# Patient Record
Sex: Female | Born: 1980 | Race: White | Hispanic: No | Marital: Married | Smoking: Current every day smoker
Health system: Southern US, Community
[De-identification: ages and names within clinical notes are randomized; demographics above are authoritative.]

## PROBLEM LIST (undated history)

## (undated) DIAGNOSIS — R531 Weakness: Secondary | ICD-10-CM

## (undated) DIAGNOSIS — M79604 Pain in right leg: Secondary | ICD-10-CM

## (undated) DIAGNOSIS — K219 Gastro-esophageal reflux disease without esophagitis: Secondary | ICD-10-CM

## (undated) DIAGNOSIS — R519 Headache, unspecified: Secondary | ICD-10-CM

## (undated) DIAGNOSIS — J45909 Unspecified asthma, uncomplicated: Secondary | ICD-10-CM

## (undated) DIAGNOSIS — D72829 Elevated white blood cell count, unspecified: Secondary | ICD-10-CM

## (undated) DIAGNOSIS — R198 Other specified symptoms and signs involving the digestive system and abdomen: Secondary | ICD-10-CM

## (undated) DIAGNOSIS — E282 Polycystic ovarian syndrome: Secondary | ICD-10-CM

## (undated) DIAGNOSIS — R51 Headache: Secondary | ICD-10-CM

## (undated) DIAGNOSIS — F458 Other somatoform disorders: Secondary | ICD-10-CM

## (undated) DIAGNOSIS — F3181 Bipolar II disorder: Secondary | ICD-10-CM

## (undated) DIAGNOSIS — M797 Fibromyalgia: Secondary | ICD-10-CM

## (undated) DIAGNOSIS — I10 Essential (primary) hypertension: Secondary | ICD-10-CM

## (undated) DIAGNOSIS — G2581 Restless legs syndrome: Secondary | ICD-10-CM

## (undated) DIAGNOSIS — E611 Iron deficiency: Secondary | ICD-10-CM

## (undated) DIAGNOSIS — H7292 Unspecified perforation of tympanic membrane, left ear: Secondary | ICD-10-CM

## (undated) DIAGNOSIS — K089 Disorder of teeth and supporting structures, unspecified: Secondary | ICD-10-CM

## (undated) DIAGNOSIS — F419 Anxiety disorder, unspecified: Secondary | ICD-10-CM

## (undated) HISTORY — DX: Essential (primary) hypertension: I10

## (undated) HISTORY — DX: Elevated white blood cell count, unspecified: D72.829

## (undated) HISTORY — DX: Unspecified perforation of tympanic membrane, left ear: H72.92

## (undated) HISTORY — PX: CHOLECYSTECTOMY: SHX55

## (undated) HISTORY — DX: Unspecified asthma, uncomplicated: J45.909

## (undated) HISTORY — PX: WISDOM TOOTH EXTRACTION: SHX21

## (undated) HISTORY — DX: Fibromyalgia: M79.7

---

## 2008-08-02 ENCOUNTER — Encounter: Admission: RE | Admit: 2008-08-02 | Discharge: 2008-08-02 | Payer: Self-pay | Admitting: Family Medicine

## 2010-09-11 DIAGNOSIS — Z9049 Acquired absence of other specified parts of digestive tract: Secondary | ICD-10-CM | POA: Insufficient documentation

## 2013-12-27 ENCOUNTER — Ambulatory Visit (INDEPENDENT_AMBULATORY_CARE_PROVIDER_SITE_OTHER): Payer: BC Managed Care – PPO | Admitting: Family Medicine

## 2013-12-27 ENCOUNTER — Encounter: Payer: Self-pay | Admitting: Family Medicine

## 2013-12-27 VITALS — BP 127/83 | HR 62 | Ht 69.0 in | Wt 261.0 lb

## 2013-12-27 DIAGNOSIS — Z87442 Personal history of urinary calculi: Secondary | ICD-10-CM

## 2013-12-27 DIAGNOSIS — I10 Essential (primary) hypertension: Secondary | ICD-10-CM

## 2013-12-27 DIAGNOSIS — M62838 Other muscle spasm: Secondary | ICD-10-CM

## 2013-12-27 DIAGNOSIS — J45909 Unspecified asthma, uncomplicated: Secondary | ICD-10-CM

## 2013-12-27 DIAGNOSIS — H7292 Unspecified perforation of tympanic membrane, left ear: Secondary | ICD-10-CM | POA: Insufficient documentation

## 2013-12-27 DIAGNOSIS — M797 Fibromyalgia: Secondary | ICD-10-CM

## 2013-12-27 DIAGNOSIS — D72829 Elevated white blood cell count, unspecified: Secondary | ICD-10-CM | POA: Insufficient documentation

## 2013-12-27 DIAGNOSIS — Z13220 Encounter for screening for lipoid disorders: Secondary | ICD-10-CM

## 2013-12-27 DIAGNOSIS — K219 Gastro-esophageal reflux disease without esophagitis: Secondary | ICD-10-CM | POA: Insufficient documentation

## 2013-12-27 DIAGNOSIS — H7290 Unspecified perforation of tympanic membrane, unspecified ear: Secondary | ICD-10-CM

## 2013-12-27 DIAGNOSIS — R51 Headache: Secondary | ICD-10-CM

## 2013-12-27 DIAGNOSIS — IMO0001 Reserved for inherently not codable concepts without codable children: Secondary | ICD-10-CM

## 2013-12-27 HISTORY — DX: Unspecified asthma, uncomplicated: J45.909

## 2013-12-27 HISTORY — DX: Essential (primary) hypertension: I10

## 2013-12-27 HISTORY — DX: Elevated white blood cell count, unspecified: D72.829

## 2013-12-27 HISTORY — DX: Fibromyalgia: M79.7

## 2013-12-27 HISTORY — DX: Unspecified perforation of tympanic membrane, left ear: H72.92

## 2013-12-27 MED ORDER — ATENOLOL-CHLORTHALIDONE 50-25 MG PO TABS: 1.0000 | ORAL_TABLET | Freq: Every day | ORAL | Status: DC

## 2013-12-27 MED ORDER — TIZANIDINE HCL 4 MG PO TABS: 2.0000 mg | ORAL_TABLET | Freq: Four times a day (QID) | ORAL | Status: DC | PRN

## 2013-12-27 MED ORDER — DEXLANSOPRAZOLE 60 MG PO CPDR: 60.0000 mg | DELAYED_RELEASE_CAPSULE | Freq: Every day | ORAL | Status: DC

## 2013-12-27 MED ORDER — GABAPENTIN 300 MG PO CAPS: 300.0000 mg | ORAL_CAPSULE | Freq: Three times a day (TID) | ORAL | Status: DC
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
## 2014-01-02 LAB — BASIC METABOLIC PANEL WITH GFR
BUN: 11 mg/dL (ref 6–23)
CALCIUM: 9.1 mg/dL (ref 8.4–10.5)
CHLORIDE: 99 meq/L (ref 96–112)
CO2: 28 meq/L (ref 19–32)
Creat: 0.76 mg/dL (ref 0.50–1.10)
GFR, Est Non African American: >89 mL/min
GLUCOSE: 93 mg/dL (ref 70–99)
POTASSIUM: 3.5 meq/L (ref 3.5–5.3)
Sodium: 136 mEq/L (ref 135–145)

## 2014-01-02 LAB — LIPID PANEL
Cholesterol: 206 mg/dL — ABNORMAL HIGH (ref 0–200)
HDL: 42 mg/dL (ref >39)
LDL CALC: 116 mg/dL — AB (ref 0–99)
Total CHOL/HDL Ratio: 4.9 Ratio
Triglycerides: 242 mg/dL — ABNORMAL HIGH (ref <150)
VLDL: 48 mg/dL — AB (ref 0–40)

## 2014-01-02 LAB — CBC WITH DIFFERENTIAL/PLATELET
BASOS PCT: 0 % (ref 0–1)
Basophils Absolute: 0 10*3/uL (ref 0.0–0.1)
EOS ABS: 0.3 10*3/uL (ref 0.0–0.7)
Eosinophils Relative: 2 % (ref 0–5)
HEMATOCRIT: 42.3 % (ref 36.0–46.0)
HEMOGLOBIN: 14.5 g/dL (ref 12.0–15.0)
Lymphocytes Relative: 38 % (ref 12–46)
Lymphs Abs: 5.1 10*3/uL — ABNORMAL HIGH (ref 0.7–4.0)
MCH: 30.1 pg (ref 26.0–34.0)
MCHC: 34.3 g/dL (ref 30.0–36.0)
MCV: 87.9 fL (ref 78.0–100.0)
MONO ABS: 0.8 10*3/uL (ref 0.1–1.0)
MONOS PCT: 6 % (ref 3–12)
Neutro Abs: 7.2 10*3/uL (ref 1.7–7.7)
Neutrophils Relative %: 54 % (ref 43–77)
Platelets: 272 10*3/uL (ref 150–400)
RBC: 4.81 MIL/uL (ref 3.87–5.11)
RDW: 13.8 % (ref 11.5–15.5)
WBC: 13.4 10*3/uL — ABNORMAL HIGH (ref 4.0–10.5)

## 2014-01-03 ENCOUNTER — Encounter: Payer: Self-pay | Admitting: Family Medicine

## 2014-01-03 DIAGNOSIS — E785 Hyperlipidemia, unspecified: Secondary | ICD-10-CM | POA: Insufficient documentation

## 2014-01-19 ENCOUNTER — Other Ambulatory Visit: Payer: Self-pay

## 2014-01-19 DIAGNOSIS — K219 Gastro-esophageal reflux disease without esophagitis: Secondary | ICD-10-CM

## 2014-01-19 MED ORDER — DEXLANSOPRAZOLE 60 MG PO CPDR
60.0000 mg | DELAYED_RELEASE_CAPSULE | Freq: Every day | ORAL | Status: DC
Start: 2014-01-19 — End: 2014-01-23

## 2014-01-23 ENCOUNTER — Other Ambulatory Visit: Payer: Self-pay

## 2014-01-23 DIAGNOSIS — K219 Gastro-esophageal reflux disease without esophagitis: Secondary | ICD-10-CM

## 2014-01-23 MED ORDER — DEXLANSOPRAZOLE 60 MG PO CPDR: 60.0000 mg | DELAYED_RELEASE_CAPSULE | Freq: Every day | ORAL | Status: DC

## 2014-01-29 ENCOUNTER — Ambulatory Visit: Payer: BC Managed Care – PPO | Admitting: Family Medicine

## 2014-04-11 ENCOUNTER — Encounter: Payer: Self-pay | Admitting: Family Medicine

## 2014-04-11 ENCOUNTER — Ambulatory Visit (INDEPENDENT_AMBULATORY_CARE_PROVIDER_SITE_OTHER): Payer: BC Managed Care – PPO | Admitting: Family Medicine

## 2014-04-11 VITALS — BP 128/79 | HR 60 | Wt 273.0 lb

## 2014-04-11 DIAGNOSIS — K2100 Gastro-esophageal reflux disease with esophagitis, without bleeding: Secondary | ICD-10-CM

## 2014-04-11 DIAGNOSIS — R6884 Jaw pain: Secondary | ICD-10-CM

## 2014-04-11 MED ORDER — DIAZEPAM 5 MG PO TABS: 5.0000 mg | ORAL_TABLET | Freq: Two times a day (BID) | ORAL | Status: DC | PRN

## 2014-04-11 MED ORDER — TRAMADOL HCL 50 MG PO TABS: 50.0000 mg | ORAL_TABLET | Freq: Three times a day (TID) | ORAL | Status: DC | PRN

## 2014-04-11 MED ORDER — PANTOPRAZOLE SODIUM 40 MG PO TBEC: 40.0000 mg | DELAYED_RELEASE_TABLET | Freq: Two times a day (BID) | ORAL | Status: DC

## 2014-04-11 NOTE — Progress Notes (Signed)
CC: Margaret Lawson is a 33 y.o. female is here for right jaw pain   Subjective: HPI:  Right jaw pain localized just above the right ankle of the mandible and below the manubrium has been present for the past week on a daily basis. Slightly improved for the first 1-2 hours in the morning when she wears a bite guard. Symptoms are also slightly improved with ibuprofen however this causes worsening abdominal pain. Slight improvement tramadol. Pain is present both at rest and when chewing, describes pain is often having a spasm component. Moderate to severe in severity. She denies radiation of pain. Denies dental pain, fevers, chills, dysphagia, trouble swallowing, facial pain, nor recent or remote trauma  Complains of mild improvement of her epigastric discomfort after starting on DEXILANT however even with savings voucher she will still cost her about $100 a month. She's back to taking ranitidine 300 mg 3 times a day with only mild improvement of epigastric discomfort radiates up the back of her throat causing an acid sensation when lying down at night. She denies unintentional weight loss or abdominal pain elsewhere    Review Of Systems Outlined In HPI  Past Medical History  Diagnosis Date  . Asthma, chronic 12/27/2013  . Essential hypertension, benign 12/27/2013  . Fibromyalgia 12/27/2013  . Leukocytosis 12/27/2013  . Perforation of left tympanic membrane 12/27/2013    No past surgical history on file. No family history on file.  History   Social History  . Marital Status: Married    Spouse Name: N/A    Number of Children: N/A  . Years of Education: N/A   Occupational History  . Not on file.   Social History Main Topics  . Smoking status: Current Every Day Smoker  . Smokeless tobacco: Not on file  . Alcohol Use: Not on file  . Drug Use: No  . Sexual Activity: Not on file   Other Topics Concern  . Not on file   Social History Narrative  . No narrative on file      Objective: BP 128/79  Pulse 60  Wt 273 lb (123.832 kg)  General: Alert and Oriented, No Acute Distress HEENT: Pupils equal, round, reactive to light. Conjunctivae clear.  External ears unremarkable, canals clear with intact TMs with appropriate landmarks.  Middle ear appears open without effusion. Pink inferior turbinates.  Moist mucous membranes, pharynx without inflammation nor lesions.  Neck supple without palpable lymphadenopathy nor abnormal masses. No dental pain on the right side of the mouth with palpation, no parotid gland or duct tenderness to palpation, pain is 100% reproduced with palpation of the right masseter Abdomen: Obese and soft Extremities: No peripheral edema.  Strong peripheral pulses.  Mental Status: No depression, anxiety, nor agitation. Skin: Warm and dry.  Assessment & Plan: Jayah was seen today for right jaw pain.  Diagnoses and associated orders for this visit:  Jaw pain - diazepam (VALIUM) 5 MG tablet; Take 1-2 tablets (5-10 mg total) by mouth every 12 (twelve) hours as needed (jaw pain). - traMADol (ULTRAM) 50 MG tablet; Take 1-2 tablets (50-100 mg total) by mouth every 8 (eight) hours as needed.  Gastroesophageal reflux disease with esophagitis - pantoprazole (PROTONIX) 40 MG tablet; Take 1 tablet (40 mg total) by mouth 2 (two) times daily.    Jaw pain: Feel that this is due to hypertonicity and straining of the right masseter muscle, begin Ultram as needed for pain control and diazepam to help with relaxing the muscle, sedative warning  provided. She's also going to look into finding a bite guard to wear during the daytime. GERD: Uncontrolled switching to Protonix twice a day to see if this is more affordable than DEXILANT  Return if symptoms worsen or fail to improve.

## 2014-04-17 ENCOUNTER — Telehealth: Payer: Self-pay

## 2014-04-17 DIAGNOSIS — R6884 Jaw pain: Secondary | ICD-10-CM

## 2014-04-17 MED ORDER — DIAZEPAM 5 MG PO TABS: 5.0000 mg | ORAL_TABLET | Freq: Two times a day (BID) | ORAL | Status: DC | PRN

## 2014-04-17 NOTE — Telephone Encounter (Signed)
I'd be willing to provide a new Rx to cover one month's worth however by my math this would only come out to 60 tablets.  If no better after a month then physical therapy would be the next step in treatment.  (Rx in Andrea's inbox)

## 2014-04-17 NOTE — Telephone Encounter (Signed)
Margaret Lawson states she wants to stop the Zanaflex and only take the diazepam. She is taking the diazepam 5 mg tablets bid daily. She would need her prescription to read 120 tablets for 30 days. She reports this dosing is helping with her jaw pain. Please advise.

## 2014-04-18 NOTE — Telephone Encounter (Signed)
Left message on pt.'s vm.

## 2014-05-03 ENCOUNTER — Other Ambulatory Visit: Payer: Self-pay | Admitting: Family Medicine

## 2014-06-04 ENCOUNTER — Other Ambulatory Visit: Payer: Self-pay | Admitting: Family Medicine

## 2014-06-07 ENCOUNTER — Encounter: Payer: Self-pay | Admitting: Emergency Medicine

## 2014-06-07 ENCOUNTER — Emergency Department (INDEPENDENT_AMBULATORY_CARE_PROVIDER_SITE_OTHER)
Admission: EM | Admit: 2014-06-07 | Discharge: 2014-06-07 | Disposition: A | Discharge status: Home / Self Care | Payer: BC Managed Care – PPO | Source: Home / Self Care | Attending: Emergency Medicine | Admitting: Emergency Medicine

## 2014-06-07 DIAGNOSIS — J4521 Mild intermittent asthma with (acute) exacerbation: Secondary | ICD-10-CM

## 2014-06-07 DIAGNOSIS — J209 Acute bronchitis, unspecified: Secondary | ICD-10-CM

## 2014-06-07 HISTORY — DX: Bipolar II disorder: F31.81

## 2014-06-07 MED ORDER — ALBUTEROL SULFATE HFA 108 (90 BASE) MCG/ACT IN AERS: 2.0000 | INHALATION_SPRAY | RESPIRATORY_TRACT | Status: DC | PRN

## 2014-06-07 MED ORDER — ALBUTEROL SULFATE (2.5 MG/3ML) 0.083% IN NEBU: 2.5000 mg | INHALATION_SOLUTION | RESPIRATORY_TRACT | Status: DC | PRN

## 2014-06-07 MED ORDER — IPRATROPIUM-ALBUTEROL 0.5-2.5 (3) MG/3ML IN SOLN
3.0000 mL | RESPIRATORY_TRACT | Status: AC
  Administered 2014-06-07: 3 mL via RESPIRATORY_TRACT

## 2014-06-07 MED ORDER — TRAMADOL HCL 50 MG PO TABS
ORAL_TABLET | ORAL | Status: DC
Start: 2014-06-07 — End: 2014-06-13

## 2014-06-07 MED ORDER — METHYLPREDNISOLONE SODIUM SUCC 125 MG IJ SOLR
125.0000 mg | Freq: Once | INTRAMUSCULAR | Status: AC
  Administered 2014-06-07: 125 mg via INTRAMUSCULAR

## 2014-06-07 MED ORDER — PREDNISONE 20 MG PO TABS: 20.0000 mg | ORAL_TABLET | Freq: Two times a day (BID) | ORAL | Status: DC

## 2014-06-07 MED ORDER — AZITHROMYCIN 250 MG PO TABS: ORAL_TABLET | ORAL | Status: DC

## 2014-06-07 NOTE — ED Provider Notes (Signed)
CSN: [id number]     Arrival date & time 06/07/14  1133 History   First MD Initiated Contact with Patient 06/07/14 1307     Chief Complaint  Patient presents with  . Cough   (Consider location/radiation/quality/duration/timing/severity/associated sxs/prior Treatment) HPI URI HISTORY  Margaret Lawson is a 33 y.o. female who complains of onset ofchest congestion, productive cough, dull, nonspecific, moderate intensity mid back pain for 3 days.OTC meds not helping No chills/sweats +  Fever  +  Nasal congestion +  Discolored Post-nasal drainage No sinus pain/pressure No sore throat  +  cough positive wheezing positivechest congestion No hemoptysis No shortness of breath No pleuritic pain  No itchy/red eyes No earache  No nausea No vomiting No abdominal pain No diarrhea  No skin rashes +  Fatigue No myalgias No headache    Past Medical History  Diagnosis Date  . Asthma, chronic 12/27/2013  . Essential hypertension, benign 12/27/2013  . Fibromyalgia 12/27/2013  . Leukocytosis 12/27/2013  . Perforation of left tympanic membrane 12/27/2013  . Bipolar 2 disorder    History reviewed. No pertinent past surgical history. No family history on file. History  Substance Use Topics  . Smoking status: Current Every Day Smoker  . Smokeless tobacco: Not on file  . Alcohol Use: Not on file   OB History    No data available     Review of Systems  All other systems reviewed and are negative.   Allergies  Lyrica and Penicillins  Home Medications   Prior to Admission medications   Medication Sig Start Date End Date Taking? Authorizing Provider  albuterol (PROVENTIL HFA;VENTOLIN HFA) 108 (90 BASE) MCG/ACT inhaler Inhale 2 puffs into the lungs every 4 (four) hours as needed for wheezing. 06/07/14   Jacqulyn Cane, MD  albuterol (PROVENTIL) (2.5 MG/3ML) 0.083% nebulizer solution Take 3 mLs (2.5 mg total) by nebulization every 4 (four) hours as needed for wheezing. 06/07/14   Jacqulyn Cane,  MD  atenolol-chlorthalidone (TENORETIC) 50-25 MG per tablet Take 1 tablet by mouth daily. 12/27/13   Marcial Pacas, DO  azithromycin (ZITHROMAX Z-PAK) 250 MG tablet Take 2 tablets on day one, then 1 tablet daily on days 2 through 5 06/07/14   Jacqulyn Cane, MD  buPROPion [facility] SR) 150 MG 12 hr tablet Take 150 mg by mouth 2 (two) times daily.    Historical Provider, MD  diazepam (VALIUM) 5 MG tablet Take 1 tablet (5 mg total) by mouth every 12 (twelve) hours as needed (jaw pain). Call Dr. for PT referral if symptoms persist to 05/15/14 04/17/14   Sean Hommel, DO  DULoxetine (CYMBALTA) 30 MG capsule Take 1 capsule (30 mg total) by mouth daily. 12/27/13   Sean Hommel, DO  gabapentin (NEURONTIN) 300 MG capsule TAKE 1 CAPSULE BY MOUTH THREE TIMES DAILY 06/05/14   Marcial Pacas, DO  lamoTRIgine (LAMICTAL) 100 MG tablet Take 100 mg by mouth daily.    Historical Provider, MD  OLANZapine (ZYPREXA) 5 MG tablet Take 5 mg by mouth at bedtime.    Historical Provider, MD  pantoprazole (PROTONIX) 40 MG tablet Take 1 tablet (40 mg total) by mouth 2 (two) times daily. 04/11/14   Marcial Pacas, DO  predniSONE (DELTASONE) 20 MG tablet Take 1 tablet (20 mg total) by mouth 2 (two) times daily with a meal. For 7 days 06/07/14   Jacqulyn Cane, MD  tiZANidine (ZANAFLEX) 4 MG tablet Take 0.5-1 tablets (2-4 mg total) by mouth every 6 (six) hours as needed for muscle spasms. 12/27/13  Sean Hommel, DO  traMADol (ULTRAM) 50 MG tablet Take 1-2 tablets (50-100 mg total) by mouth every 8 (eight) hours as needed. 04/11/14   Marcial Pacas, DO  traMADol (ULTRAM) 50 MG tablet One or 2 tablets every 8 hours as needed for severe pain 06/07/14   Jacqulyn Cane, MD  traZODone (DESYREL) 50 MG tablet Take 1-2 tablets (50-100 mg total) by mouth daily. 12/27/13   Sean Hommel, DO   BP 128/83 mmHg  Pulse 66  Temp(Src) 98 F (36.7 C) (Oral)  Ht 5\' 7"  (1.702 m)  Wt 273 lb (123.832 kg)  BMI 42.75 kg/m2  SpO2 96%  LMP 06/06/2014 Physical Exam   Constitutional: She is oriented to person, place, and time. She appears well-developed and well-nourished. No distress.  Strong odor of tobacco. She appears uncomfortable but no acute respiratory distress  HENT:  Head: Normocephalic and atraumatic.  Right Ear: Tympanic membrane, external ear and ear canal normal.  Left Ear: Tympanic membrane, external ear and ear canal normal.  Nose: Mucosal edema and rhinorrhea present. Right sinus exhibits maxillary sinus tenderness. Left sinus exhibits maxillary sinus tenderness.  Mouth/Throat: Oropharynx is clear and moist. No oral lesions. No oropharyngeal exudate.  Eyes: Right eye exhibits no discharge. Left eye exhibits no discharge. No scleral icterus.  Neck: Neck supple.  Cardiovascular: Normal rate, regular rhythm and normal heart sounds.   Pulmonary/Chest: Effort normal. She has wheezes. She has rhonchi. She has no rales.  Lymphadenopathy:    She has no cervical adenopathy.  Neurological: She is alert and oriented to person, place, and time.  Skin: Skin is warm and dry.  Psychiatric: She has a normal mood and affect.  Nursing note and vitals reviewed.   ED Course  Procedures (including critical care time) Labs Review Labs Reviewed - No data to display  Imaging Review No results found.   MDM   1. Bronchitis, acute, with bronchospasm   2. Asthma with acute exacerbation, mild intermittent   URI/sinusitis  Treatment options discussed, as well as risks, benefits, alternatives. Patient voiced understanding and agreement with the following plans: DuoNeb nebulizer treatment given. Wheezing improved. Solu-Medrol 125 mg IM Discharge Medication List as of 06/07/2014  2:28 PM    START taking these medications   Details  albuterol (PROVENTIL HFA;VENTOLIN HFA) 108 (90 BASE) MCG/ACT inhaler Inhale 2 puffs into the lungs every 4 (four) hours as needed for wheezing., Starting 06/07/2014, Until Discontinued, Print    albuterol (PROVENTIL)  (2.5 MG/3ML) 0.083% nebulizer solution Take 3 mLs (2.5 mg total) by nebulization every 4 (four) hours as needed for wheezing., Starting 06/07/2014, Until Discontinued, Print    azithromycin (ZITHROMAX Z-PAK) 250 MG tablet Take 2 tablets on day one, then 1 tablet daily on days 2 through 5, Print    predniSONE (DELTASONE) 20 MG tablet Take 1 tablet (20 mg total) by mouth 2 (two) times daily with a meal. For 7 days, Starting 06/07/2014, Until Discontinued, Print    !! traMADol (ULTRAM) 50 MG tablet One or 2 tablets every 8 hours as needed for severe pain, Print        Other symptomatic care discussed. Advised to quit smoking Follow-up with your primary care doctor in 5-7 days if not improving, or sooner if symptoms become worse. Precautions discussed. Red flags discussed. Questions invited and answered. Patient voiced understanding and agreement.     Jacqulyn Cane, MD 06/08/14 2126

## 2014-06-07 NOTE — ED Notes (Signed)
Productive cough, congestion, mid-back pain x 3 days

## 2014-06-13 ENCOUNTER — Ambulatory Visit (INDEPENDENT_AMBULATORY_CARE_PROVIDER_SITE_OTHER): Payer: BC Managed Care – PPO | Admitting: Family Medicine

## 2014-06-13 ENCOUNTER — Encounter: Payer: Self-pay | Admitting: Family Medicine

## 2014-06-13 VITALS — BP 142/87 | HR 64 | Temp 98.0°F | Wt 270.0 lb

## 2014-06-13 DIAGNOSIS — J4541 Moderate persistent asthma with (acute) exacerbation: Secondary | ICD-10-CM

## 2014-06-13 MED ORDER — IPRATROPIUM-ALBUTEROL 0.5-2.5 (3) MG/3ML IN SOLN
3.0000 mL | Freq: Once | RESPIRATORY_TRACT | Status: AC
  Administered 2014-06-13: 3 mL via RESPIRATORY_TRACT

## 2014-06-13 MED ORDER — DOXYCYCLINE HYCLATE 100 MG PO TABS: ORAL_TABLET | ORAL | Status: AC

## 2014-06-13 MED ORDER — IPRATROPIUM-ALBUTEROL 0.5-2.5 (3) MG/3ML IN SOLN: 3.0000 mL | RESPIRATORY_TRACT | Status: DC | PRN

## 2014-06-13 MED ORDER — PREDNISONE 20 MG PO TABS: ORAL_TABLET | ORAL | Status: DC

## 2014-06-13 MED ORDER — IPRATROPIUM-ALBUTEROL 0.5-2.5 (3) MG/3ML IN SOLN: 3.0000 mL | RESPIRATORY_TRACT | Status: DC

## 2014-06-13 NOTE — Progress Notes (Signed)
CC: Margaret Lawson is a 33 y.o. female is here for Asthma   Subjective: HPI:  Patient complains of productive cough with chest congestion and wheezing with shortness of breath all of which is moderate in severity and overall barely improved since being prescribed 40 mg of prednisone, albuterol nebulizer and azithromycin last week. Symptoms have been present for 10 days now. Wheezing and shortness of breath is most responsive to albuterol but only temporarily.  She reports this is also accompanied by fatigue. Symptoms are present all hours of the day but most noticeable at night. Denies fevers, chills, chest pain, confusion, motor or sensory disturbances. Review of systems is positive for postnasal drip and nasal congestion with occasional nosebleeds.  She elaborates on the above complaints tying into worsening her fibromyalgia and pain. Her pain is localized mostly in her back and is worse the more active she is during the day. Without my specific questioning she provides a great deal of elaborating on how her other chronic medical conditions could tie into her respiratory illness today   Review Of Systems Outlined In HPI  Past Medical History  Diagnosis Date  . Asthma, chronic 12/27/2013  . Essential hypertension, benign 12/27/2013  . Fibromyalgia 12/27/2013  . Leukocytosis 12/27/2013  . Perforation of left tympanic membrane 12/27/2013  . Bipolar 2 disorder     No past surgical history on file. No family history on file.  History   Social History  . Marital Status: Married    Spouse Name: N/A    Number of Children: N/A  . Years of Education: N/A   Occupational History  . Not on file.   Social History Main Topics  . Smoking status: Current Every Day Smoker  . Smokeless tobacco: Not on file  . Alcohol Use: Not on file  . Drug Use: No  . Sexual Activity: Not on file   Other Topics Concern  . Not on file   Social History Narrative     Objective: BP 142/87 mmHg  Pulse 64   Temp(Src) 98 F (36.7 C) (Oral)  Wt 270 lb (122.471 kg)  SpO2 96%  LMP 06/06/2014  General: Alert and Oriented, No Acute Distress HEENT: Pupils equal, round, reactive to light. Conjunctivae clear.  External ears unremarkable, canals clear with intact TMs with appropriate landmarks.  Middle ear appears open without effusion. Pink inferior turbinates.  Moist mucous membranes, pharynx without inflammation nor lesions however moderate cobblestoning and postnasal drip.  Neck supple without palpable lymphadenopathy nor abnormal masses. Lungs: comfortable work of breathing with trace and asked for wheezing in all lung fields. There is no rhonchi or rales. She is frequently coughing  Extremities: No peripheral edema.  Strong peripheral pulses.  Mental Status: No depression, anxiety, nor agitation. Skin: Warm and dry.  Assessment & Plan: Margaret Lawson was seen today for asthma.  Diagnoses and associated orders for this visit:  Asthma with acute exacerbation, moderate persistent - ipratropium-albuterol (DUONEB) 0.5-2.5 (3) MG/3ML SOLN; Take 3 mLs by nebulization every 4 (four) hours as needed (wheezing/cough/sob). - predniSONE (DELTASONE) 20 MG tablet; Three tabs daily days 1-3, two tabs daily days 4-6, one tab daily days 7-9, half tab daily days 10-13. - doxycycline (VIBRA-TABS) 100 MG tablet; One by mouth twice a day for ten days. - Discontinue: ipratropium-albuterol (DUONEB) 0.5-2.5 (3) MG/3ML nebulizer solution 3 mL; Take 3 mLs by nebulization every 4 (four) hours. - ipratropium-albuterol (DUONEB) 0.5-2.5 (3) MG/3ML nebulizer solution 3 mL; Take 3 mLs by nebulization once.  Redirecting from her chronic medical conditions was required 4 times to focus on her acute visit chief complaint of asthma exacerbation. I do believe she is right that she is having an asthma exacerbation and I Like to be more aggressive about her treatment now adding ipratropium to her albuterol regimen. Increasing prednisone  dose and begin taper. Stop azithromycin and switch to doxycycline. Prior to leaving she was provided with a DuoNeb treatment since it may be many hours and she can get this new prescription refill. One of her other repeated concerns today was chronic back pain management, I've asked her to let me know  Her back response to the prednisone above.  40 minutes spent face-to-face during visit today of which at least 50% was counseling or coordinating care regarding: 1. Asthma with acute exacerbation, moderate persistent       Return if symptoms worsen or fail to improve.

## 2014-06-18 ENCOUNTER — Telehealth: Payer: Self-pay | Admitting: *Deleted

## 2014-06-18 MED ORDER — HYDROCODONE-HOMATROPINE 5-1.5 MG/5ML PO SYRP: 5.0000 mL | ORAL_SOLUTION | Freq: Three times a day (TID) | ORAL | Status: DC | PRN

## 2014-06-18 NOTE — Telephone Encounter (Signed)
Seth Bake, Rx placed in in-box ready for pickup/faxing. (hycodan)

## 2014-06-18 NOTE — Telephone Encounter (Signed)
Margaret Lawson called and she is still experiencing chest discomfort and tightness even after being on the steroids for 1 week. She just wanted you to be aware and if she was aloud to have anything for pain now? Margette Fast, CMA

## 2014-06-19 NOTE — Telephone Encounter (Signed)
Understood, thanks.  The cough medicine has a pain medication ingredient which should still help pain.

## 2014-06-19 NOTE — Telephone Encounter (Signed)
The pt's is not having chest congestion,discomfort or tightness. It is her back that is still bothering her and giving her pain after having finished the prednisone

## 2014-06-20 NOTE — Telephone Encounter (Signed)
Pt.notified

## 2014-06-26 ENCOUNTER — Other Ambulatory Visit: Payer: Self-pay | Admitting: Family Medicine

## 2014-07-04 ENCOUNTER — Other Ambulatory Visit: Payer: Self-pay | Admitting: Family Medicine

## 2014-07-09 ENCOUNTER — Telehealth: Payer: Self-pay | Admitting: *Deleted

## 2014-07-09 NOTE — Telephone Encounter (Signed)
Pt called and states the cough medication rx'ed on 11/23 did help with her back pain( there was a misunderstanding in triage and initial note suggested pt had a cough but it was back pain from fibromyalgia) Pt wants to know what else she can do for her fibromyalgia pain

## 2014-07-10 NOTE — Telephone Encounter (Signed)
Follow up appt advised

## 2014-07-11 NOTE — Telephone Encounter (Signed)
Pt.notified

## 2014-07-13 ENCOUNTER — Other Ambulatory Visit: Payer: Self-pay | Admitting: Family Medicine

## 2014-07-17 ENCOUNTER — Other Ambulatory Visit: Payer: Self-pay | Admitting: Family Medicine

## 2014-07-27 ENCOUNTER — Other Ambulatory Visit: Payer: Self-pay | Admitting: Family Medicine

## 2014-08-03 ENCOUNTER — Encounter: Payer: Self-pay | Admitting: Family Medicine

## 2014-08-03 ENCOUNTER — Ambulatory Visit (INDEPENDENT_AMBULATORY_CARE_PROVIDER_SITE_OTHER): Payer: BLUE CROSS/BLUE SHIELD | Admitting: Family Medicine

## 2014-08-03 VITALS — BP 151/98 | HR 54 | Wt 284.0 lb

## 2014-08-03 DIAGNOSIS — M79642 Pain in left hand: Secondary | ICD-10-CM | POA: Diagnosis not present

## 2014-08-03 DIAGNOSIS — M797 Fibromyalgia: Secondary | ICD-10-CM

## 2014-08-03 DIAGNOSIS — M79641 Pain in right hand: Secondary | ICD-10-CM | POA: Diagnosis not present

## 2014-08-03 DIAGNOSIS — M5489 Other dorsalgia: Secondary | ICD-10-CM | POA: Diagnosis not present

## 2014-08-03 MED ORDER — GABAPENTIN 400 MG PO CAPS: 400.0000 mg | ORAL_CAPSULE | Freq: Three times a day (TID) | ORAL | Status: DC

## 2014-08-03 NOTE — Progress Notes (Signed)
CC: Margaret Lawson is a 34 y.o. female is here for f/u fibromyalgia   Subjective: HPI:  "my whole body feels like a bruise" which has been going on for the past month. Symptoms fluctuate from mild to severe in severity. She describes her pain as sensitivity to light touch to her skin or any of her muscles. Locations of pain seem to migrate throughout the day and week however predominantly based as migrating to the right leg, upper chest and upper back. She's also been having increased paresthesia in both of her hands mostly in an ulnar distribution and mostly when sleeping. She tells me that this pain above represents what she considers her chronic fibromyalgia symptoms however she is beginning to have bilateral hand pain in all the knuckles which is symmetric in accompanied by swelling. This is been going on for the past 2 weeks and is new to her. She denies any skin changes. There is been no fevers, chills, nor focal joint pain elsewhere other than that described above   Review Of Systems Outlined In HPI  Past Medical History  Diagnosis Date  . Asthma, chronic 12/27/2013  . Essential hypertension, benign 12/27/2013  . Fibromyalgia 12/27/2013  . Leukocytosis 12/27/2013  . Perforation of left tympanic membrane 12/27/2013  . Bipolar 2 disorder     No past surgical history on file. No family history on file.  History   Social History  . Marital Status: Married    Spouse Name: N/A    Number of Children: N/A  . Years of Education: N/A   Occupational History  . Not on file.   Social History Main Topics  . Smoking status: Current Every Day Smoker  . Smokeless tobacco: Not on file  . Alcohol Use: Not on file  . Drug Use: No  . Sexual Activity: Not on file   Other Topics Concern  . Not on file   Social History Narrative     Objective: BP 151/98 mmHg  Pulse 54  Wt 284 lb (128.822 kg)  Vital signs reviewed. General: Alert and Oriented, No Acute Distress HEENT: Pupils equal,  round, reactive to light. Conjunctivae clear.  External ears unremarkable.  Moist mucous membranes. Lungs: Clear and comfortable work of breathing, speaking in full sentences without accessory muscle use. Cardiac: Regular rate and rhythm.  Neuro: CN II-XII grossly intact, gait normal. Extremities: No lower extremityperipheral edema.  Strong peripheral pulses. Questionable swelling of bilateral MCPs no swelling of DIP or PIPs Mental Status: No depression, anxiety, nor agitation. Logical though process. Skin: Warm and dry.  Assessment & Plan: Margaret Lawson was seen today for f/u fibromyalgia.  Diagnoses and associated orders for this visit:  Fibromyalgia - gabapentin (NEURONTIN) 400 MG capsule; Take 1 capsule (400 mg total) by mouth 3 (three) times daily.  Midline back pain, unspecified location - Rheumatoid Factor - Cyclic citrul peptide antibody, IgG - Antinuclear Antib (ANA)  Bilateral hand pain - Rheumatoid Factor - Cyclic citrul peptide antibody, IgG - Antinuclear Antib (ANA)    Fibromyalgia: Uncontrolled chronic condition increasing gabapentin She tells me she's never had a rheumatologic workup and all of her pain is always been passed off as fibromyalgia therefore given her new hand pain today we will screen for connective tissue disorders and rheumatologic disease.  Return in about 4 weeks (around 08/31/2014) for Pain.

## 2014-08-04 LAB — RHEUMATOID FACTOR

## 2014-08-06 ENCOUNTER — Other Ambulatory Visit: Payer: Self-pay | Admitting: Family Medicine

## 2014-08-06 LAB — ANA: Anti Nuclear Antibody(ANA): NEGATIVE

## 2014-08-07 LAB — CYCLIC CITRUL PEPTIDE ANTIBODY, IGG: Cyclic Citrullin Peptide Ab: <2 U/mL (ref 0.0–5.0)

## 2014-08-23 ENCOUNTER — Other Ambulatory Visit: Payer: Self-pay | Admitting: Family Medicine

## 2014-08-23 ENCOUNTER — Telehealth: Payer: Self-pay | Admitting: Family Medicine

## 2014-08-23 DIAGNOSIS — K2100 Gastro-esophageal reflux disease with esophagitis, without bleeding: Secondary | ICD-10-CM

## 2014-08-23 MED ORDER — PANTOPRAZOLE SODIUM 40 MG PO TBEC: 40.0000 mg | DELAYED_RELEASE_TABLET | Freq: Two times a day (BID) | ORAL | Status: DC

## 2014-08-23 NOTE — Telephone Encounter (Signed)
Refill req 

## 2014-08-29 ENCOUNTER — Other Ambulatory Visit: Payer: Self-pay | Admitting: Family Medicine

## 2014-08-30 NOTE — Telephone Encounter (Signed)
Need appt specifically for BP

## 2014-10-03 ENCOUNTER — Other Ambulatory Visit: Payer: Self-pay | Admitting: Family Medicine

## 2014-10-11 ENCOUNTER — Encounter: Payer: Self-pay | Admitting: Family Medicine

## 2014-10-11 ENCOUNTER — Ambulatory Visit (INDEPENDENT_AMBULATORY_CARE_PROVIDER_SITE_OTHER): Payer: BLUE CROSS/BLUE SHIELD | Admitting: Family Medicine

## 2014-10-11 VITALS — BP 158/111 | HR 109 | Wt 280.0 lb

## 2014-10-11 DIAGNOSIS — F3130 Bipolar disorder, current episode depressed, mild or moderate severity, unspecified: Secondary | ICD-10-CM | POA: Diagnosis not present

## 2014-10-11 DIAGNOSIS — K2100 Gastro-esophageal reflux disease with esophagitis, without bleeding: Secondary | ICD-10-CM

## 2014-10-11 DIAGNOSIS — M797 Fibromyalgia: Secondary | ICD-10-CM

## 2014-10-11 DIAGNOSIS — I10 Essential (primary) hypertension: Secondary | ICD-10-CM | POA: Diagnosis not present

## 2014-10-11 MED ORDER — GABAPENTIN 600 MG PO TABS: 600.0000 mg | ORAL_TABLET | Freq: Three times a day (TID) | ORAL | Status: DC

## 2014-10-11 MED ORDER — ATENOLOL-CHLORTHALIDONE 100-25 MG PO TABS: 1.0000 | ORAL_TABLET | Freq: Every day | ORAL | Status: DC

## 2014-10-11 MED ORDER — PANTOPRAZOLE SODIUM 40 MG PO TBEC: 40.0000 mg | DELAYED_RELEASE_TABLET | Freq: Every day | ORAL | Status: DC

## 2014-10-11 NOTE — Progress Notes (Signed)
CC: Margaret Lawson is a 34 y.o. female is here for Fibromyalgia   Subjective: HPI:  Follow-up essential hypertension: Has run out of atenolol-chlorthalidone at the dose of 50-25 milligrams daily. She's been off of this for 1 week now. Prior to running out of the medication systolic numbers were still in stage I hypertension and diastolics were in stage I as well. No chest pain shortness of breath orthopnea nor peripheral edema. No formal exercise routine  Follow fibromyalgia: Since she saw me last she went to see a rheumatologist to get a second opinion on whether or not her pain was from fibromyalgia or some other rheumatologic disease. Her rheumatologist agreed that her pain is from fibromyalgia is uncontrolled and needs to be addressed by quitting smoking and losing weight first. The patient was not happy with this answer and did not schedule follow-up with that provider. She continues to have "electric" pain on the surface of her skin that roams throughout the body and does not seem to predict or reproducibly follow any pattern. This present both with rest and with activity. It seems to be worse after periods of inactivity. The pain is bad enough to where it limits her from going out and doing simple things such as grocery shopping. She denies any joint pain and is localizing pain to the skin and muscle bellies. She reports weakness but no focal location of the weakness. Symptoms seem to have been worsening since I saw her last.   Follow-up GERD: Her insurance company is no longer covering Protonix twice a day. She's been off of this medication for a few weeks now and has been attempting to control epigastric discomfort and reflux with Prevacid which has been ineffective. She is not waking from her pain at night and there's been no unintentional weight loss.  Follow bipolar disorder: She stopped seeing her former psychiatrist and stopped all of her psychiatric medication because her former  provider was no longer fully covered by her insurance.   Review Of Systems Outlined In HPI  Past Medical History  Diagnosis Date  . Asthma, chronic 12/27/2013  . Essential hypertension, benign 12/27/2013  . Fibromyalgia 12/27/2013  . Leukocytosis 12/27/2013  . Perforation of left tympanic membrane 12/27/2013  . Bipolar 2 disorder     No past surgical history on file. No family history on file.  History   Social History  . Marital Status: Married    Spouse Name: N/A  . Number of Children: N/A  . Years of Education: N/A   Occupational History  . Not on file.   Social History Main Topics  . Smoking status: Current Every Day Smoker  . Smokeless tobacco: Not on file  . Alcohol Use: Not on file  . Drug Use: No  . Sexual Activity: Not on file   Other Topics Concern  . Not on file   Social History Narrative     Objective: BP 158/111 mmHg  Pulse 109  Wt 280 lb (127.007 kg)  Vital signs reviewed. General: Alert and Oriented, No Acute Distress HEENT: Pupils equal, round, reactive to light. Conjunctivae clear.  External ears unremarkable.  Moist mucous membranes. Lungs: Clear and comfortable work of breathing, speaking in full sentences without accessory muscle use. Cardiac: Regular rate and rhythm.  Neuro: CN II-XII grossly intact, gait normal. Extremities: No peripheral edema.  Strong peripheral pulses.  Mental Status: Moderate depression. No anxiety, nor agitation. Logical though process. Skin: Warm and dry.  Assessment & Plan: Margaret Lawson was seen today  for fibromyalgia.  Diagnoses and all orders for this visit:  Essential hypertension, benign Orders: -     atenolol-chlorthalidone (TENORETIC 100) 100-25 MG per tablet; Take 1 tablet by mouth daily.  Fibromyalgia Orders: -     gabapentin (NEURONTIN) 600 MG tablet; Take 1 tablet (600 mg total) by mouth 3 (three) times daily.  Gastroesophageal reflux disease with esophagitis Orders: -     pantoprazole (PROTONIX) 40 MG  tablet; Take 1 tablet (40 mg total) by mouth daily.  Bipolar I disorder, most recent episode depressed Orders: -     Ambulatory referral to Psychiatry   essential hypertension: Uncontrolled increasing Tenoretic Fibromyalgia: Uncontrolled increasing gabapentin. She's asked me if I can comment on the degree of her fibromyalgia and how it makes her fully disabled since she was declined disability since I saw her last. I've let her know that I don't feel like I'm qualified to labeled whether or not she is fully disabled due to her fibromyalgia but I do agree that is uncontrolled right now and it has been difficult for her and her former providers to treat. I reminded her of Savella as another option that we can use in the future but we are running out of options for fibromyalgia. I also think that her fibro- myalgia would improve if her psychiatric state were optimized, she quit smoking, and try to be more active with low impact activities. Advised her to get a lawyer to pursue disability claim. GERD: Uncontrolled restart Protonix will attempt to see if only single 40 mg dose a day is adequate Bipolar disorder: Referral to behavioral health downstairs, her degree of bipolar disorder would warrant psychiatric services beyond a scope of care that I can provide her.  40 minutes spent face-to-face during visit today of which at least 50% was counseling or coordinating care regarding: 1. Essential hypertension, benign   2. Fibromyalgia   3. Gastroesophageal reflux disease with esophagitis   4. Bipolar I disorder, most recent episode depressed       Return in about 4 weeks (around 11/08/2014).

## 2014-10-18 ENCOUNTER — Ambulatory Visit (INDEPENDENT_AMBULATORY_CARE_PROVIDER_SITE_OTHER): Payer: 59 | Admitting: Psychiatry

## 2014-10-18 ENCOUNTER — Ambulatory Visit (HOSPITAL_COMMUNITY): Payer: BLUE CROSS/BLUE SHIELD | Admitting: Licensed Clinical Social Worker

## 2014-10-18 ENCOUNTER — Encounter (HOSPITAL_COMMUNITY): Payer: Self-pay | Admitting: Psychiatry

## 2014-10-18 ENCOUNTER — Telehealth: Payer: Self-pay | Admitting: *Deleted

## 2014-10-18 VITALS — BP 145/95 | HR 83 | Ht 68.0 in | Wt 271.0 lb

## 2014-10-18 DIAGNOSIS — F332 Major depressive disorder, recurrent severe without psychotic features: Secondary | ICD-10-CM | POA: Diagnosis not present

## 2014-10-18 DIAGNOSIS — F4010 Social phobia, unspecified: Secondary | ICD-10-CM

## 2014-10-18 DIAGNOSIS — F3181 Bipolar II disorder: Secondary | ICD-10-CM

## 2014-10-18 DIAGNOSIS — F4310 Post-traumatic stress disorder, unspecified: Secondary | ICD-10-CM

## 2014-10-18 MED ORDER — TIZANIDINE HCL 4 MG PO TABS: ORAL_TABLET | ORAL | Status: DC

## 2014-10-18 MED ORDER — TOPIRAMATE 25 MG PO TABS: 50.0000 mg | ORAL_TABLET | Freq: Every day | ORAL | Status: DC
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
## 2014-10-18 NOTE — Addendum Note (Signed)
Addended by: Marcial Pacas on: 10/18/2014 03:24 PM   Modules accepted: Orders

## 2014-10-18 NOTE — Telephone Encounter (Signed)
Zanaflex Rx for 120 doses every refill has been sent to [facility].  I encourage patients with fibromyalgia to exercise regularly so I can't be of any help with the [employer] situation.

## 2014-10-18 NOTE — Telephone Encounter (Signed)
Called & informed Margaret Lawson about her medication refill

## 2014-11-08 ENCOUNTER — Telehealth: Payer: Self-pay | Admitting: Family Medicine

## 2014-11-08 ENCOUNTER — Ambulatory Visit: Payer: BLUE CROSS/BLUE SHIELD | Admitting: Family Medicine

## 2014-11-08 NOTE — Telephone Encounter (Signed)
Patient is doing well since you increased the Neurontin.  She wanted you to know.  She is going in for follow up in May.

## 2014-11-16 ENCOUNTER — Ambulatory Visit (HOSPITAL_COMMUNITY): Payer: Self-pay | Admitting: Psychiatry

## 2014-12-10 ENCOUNTER — Encounter: Payer: Self-pay | Admitting: Family Medicine

## 2014-12-10 ENCOUNTER — Encounter: Payer: Self-pay | Admitting: Obstetrics & Gynecology

## 2014-12-10 ENCOUNTER — Ambulatory Visit (INDEPENDENT_AMBULATORY_CARE_PROVIDER_SITE_OTHER): Payer: BLUE CROSS/BLUE SHIELD | Admitting: Family Medicine

## 2014-12-10 VITALS — BP 132/90 | HR 65 | Temp 98.2°F | Wt 282.0 lb

## 2014-12-10 DIAGNOSIS — J4521 Mild intermittent asthma with (acute) exacerbation: Secondary | ICD-10-CM | POA: Diagnosis not present

## 2014-12-10 MED ORDER — DOXYCYCLINE HYCLATE 100 MG PO TABS: ORAL_TABLET | ORAL | Status: AC

## 2014-12-10 MED ORDER — DOXYCYCLINE HYCLATE 100 MG PO TABS: ORAL_TABLET | ORAL | Status: DC

## 2014-12-10 NOTE — Progress Notes (Signed)
CC: Margaret Lawson is a 34 y.o. female is here for Sinusitis   Subjective: HPI:  Nonproductive cough, sore throat, wheezing and nasal congestion all of which have been present for the last day and a half. Wheezing seems to improve with albuterol for about 6-12 hours. Ibuprofen and over-the-counter cough and cold medication and did not help with anything else. Symptoms are moderate in severity worse at night. Denies fevers, chills, difficulty swallowing, chest pain, shortness of breath, nor rashes.   Review Of Systems Outlined In HPI  Past Medical History  Diagnosis Date  . Asthma, chronic 12/27/2013  . Essential hypertension, benign 12/27/2013  . Fibromyalgia 12/27/2013  . Leukocytosis 12/27/2013  . Perforation of left tympanic membrane 12/27/2013  . Bipolar 2 disorder     No past surgical history on file. Family History  Problem Relation Age of Onset  . Depression Father   . Depression Sister     History   Social History  . Marital Status: Married    Spouse Name: N/A  . Number of Children: N/A  . Years of Education: N/A   Occupational History  . Not on file.   Social History Main Topics  . Smoking status: Current Every Day Smoker  . Smokeless tobacco: Not on file  . Alcohol Use: No  . Drug Use: No  . Sexual Activity: Not on file   Other Topics Concern  . Not on file   Social History Narrative     Objective: BP 132/90 mmHg  Pulse 65  Temp(Src) 98.2 F (36.8 C) (Oral)  Wt 282 lb (127.914 kg)  General: Alert and Oriented, No Acute Distress HEENT: Pupils equal, round, reactive to light. Conjunctivae clear.  External ears unremarkable, canals clear with intact TMs with appropriate landmarks.  Middle ear appears open without effusion. Pink inferior turbinates.  Moist mucous membranes,   both tonsils are moderately enlarged and moderately erythematous with white patches. The uvula is midline. Neck supple without palpable lymphadenopathy nor abnormal masses. Lungs:   Comfortable work of breathing with trace expiratory wheezing in the left posterior upper lung field. Cardiac: Regular rate and rhythm. Normal S1/S2.  No murmurs, rubs, nor gallops.   Mental Status: No depression, anxiety, nor agitation. Skin: Warm and dry.  Assessment & Plan: Margaret Lawson was seen today for sinusitis.  Diagnoses and all orders for this visit:  Asthma, chronic, mild intermittent, with acute exacerbation Orders: -     Discontinue: doxycycline (VIBRA-TABS) 100 MG tablet; One by mouth twice a day for ten days. -     doxycycline (VIBRA-TABS) 100 MG tablet; One by mouth twice a day for ten days.   Suspect asthma exacerbation not necessarily requiring prednisone at this time but start doxycycline.  Return if symptoms worsen or fail to improve.

## 2014-12-11 ENCOUNTER — Ambulatory Visit: Payer: Self-pay | Admitting: Family Medicine

## 2014-12-12 ENCOUNTER — Telehealth: Payer: Self-pay

## 2014-12-12 MED ORDER — FLUCONAZOLE 150 MG PO TABS: 150.0000 mg | ORAL_TABLET | Freq: Once | ORAL | Status: DC

## 2014-12-12 NOTE — Telephone Encounter (Signed)
Patient was given an antibiotic on 12/10/14 and she request a Rx for Diflucan 150 mg. #1 0 R was sent to [facility]. Lewin Pellow,CMA

## 2014-12-14 ENCOUNTER — Ambulatory Visit: Payer: Self-pay | Admitting: Family Medicine

## 2014-12-17 ENCOUNTER — Encounter: Payer: Self-pay | Admitting: Obstetrics & Gynecology

## 2014-12-17 DIAGNOSIS — Z01419 Encounter for gynecological examination (general) (routine) without abnormal findings: Secondary | ICD-10-CM

## 2014-12-27 ENCOUNTER — Ambulatory Visit: Payer: Self-pay | Admitting: Family Medicine

## 2015-01-02 ENCOUNTER — Ambulatory Visit: Payer: Self-pay | Admitting: Family Medicine

## 2015-01-02 ENCOUNTER — Emergency Department (INDEPENDENT_AMBULATORY_CARE_PROVIDER_SITE_OTHER)
Admission: EM | Admit: 2015-01-02 | Discharge: 2015-01-02 | Disposition: A | Discharge status: Home / Self Care | Payer: BLUE CROSS/BLUE SHIELD | Source: Home / Self Care | Attending: Emergency Medicine | Admitting: Emergency Medicine

## 2015-01-02 ENCOUNTER — Encounter: Payer: Self-pay | Admitting: *Deleted

## 2015-01-02 DIAGNOSIS — M25571 Pain in right ankle and joints of right foot: Secondary | ICD-10-CM

## 2015-01-02 DIAGNOSIS — H6505 Acute serous otitis media, recurrent, left ear: Secondary | ICD-10-CM

## 2015-01-02 HISTORY — DX: Polycystic ovarian syndrome: E28.2

## 2015-01-02 MED ORDER — CEFDINIR 300 MG PO CAPS: 300.0000 mg | ORAL_CAPSULE | Freq: Two times a day (BID) | ORAL | Status: DC

## 2015-01-02 MED ORDER — HYDROCODONE-ACETAMINOPHEN 5-325 MG PO TABS: 2.0000 | ORAL_TABLET | ORAL | Status: DC | PRN

## 2015-01-02 NOTE — ED Notes (Signed)
Pt c/o LT ear pain and popping intermittently x 1 1/2 wk. She also c/o RT ankle pain, burning and tender to touch x 2 mths.

## 2015-01-02 NOTE — Discharge Instructions (Signed)
Arthralgia °Your caregiver has diagnosed you as suffering from an arthralgia. Arthralgia means there is pain in a joint. This can come from many reasons including: °· Bruising the joint which causes soreness (inflammation) in the joint. °· Wear and tear on the joints which occur as we grow older (osteoarthritis). °· Overusing the joint. °· Various forms of arthritis. °· Infections of the joint. °Regardless of the cause of pain in your joint, most of these different pains respond to anti-inflammatory drugs and rest. The exception to this is when a joint is infected, and these cases are treated with antibiotics, if it is a bacterial infection. °HOME CARE INSTRUCTIONS  °· Rest the injured area for as long as directed by your caregiver. Then slowly start using the joint as directed by your caregiver and as the pain allows. Crutches as directed may be useful if the ankles, knees or hips are involved. If the knee was splinted or casted, continue use and care as directed. If an stretchy or elastic wrapping bandage has been applied today, it should be removed and re-applied every 3 to 4 hours. It should not be applied tightly, but firmly enough to keep swelling down. Watch toes and feet for swelling, bluish discoloration, coldness, numbness or excessive pain. If any of these problems (symptoms) occur, remove the ace bandage and re-apply more loosely. If these symptoms persist, contact your caregiver or return to this location. °· For the first 24 hours, keep the injured extremity elevated on pillows while lying down. °· Apply ice for 15-20 minutes to the sore joint every couple hours while awake for the first half day. Then 03-04 times per day for the first 48 hours. Put the ice in a plastic bag and place a towel between the bag of ice and your skin. °· Wear any splinting, casting, elastic bandage applications, or slings as instructed. °· Only take over-the-counter or prescription medicines for pain, discomfort, or fever as  directed by your caregiver. Do not use aspirin immediately after the injury unless instructed by your physician. Aspirin can cause increased bleeding and bruising of the tissues. °· If you were given crutches, continue to use them as instructed and do not resume weight bearing on the sore joint until instructed. °Persistent pain and inability to use the sore joint as directed for more than 2 to 3 days are warning signs indicating that you should see a caregiver for a follow-up visit as soon as possible. Initially, a hairline fracture (break in bone) may not be evident on X-rays. Persistent pain and swelling indicate that further evaluation, non-weight bearing or use of the joint (use of crutches or slings as instructed), or further X-rays are indicated. X-rays may sometimes not show a small fracture until a week or 10 days later. Make a follow-up appointment with your own caregiver or one to whom we have referred you. A radiologist (specialist in reading X-rays) may read your X-rays. Make sure you know how you are to obtain your X-ray results. Do not assume everything is normal if you do not hear from us. °SEEK MEDICAL CARE IF: °Bruising, swelling, or pain increases. °SEEK IMMEDIATE MEDICAL CARE IF:  °· Your fingers or toes are numb or blue. °· The pain is not responding to medications and continues to stay the same or get worse. °· The pain in your joint becomes severe. °· You develop a fever over 102° F (38.9° C). °· It becomes impossible to move or use the joint. °MAKE SURE YOU:  °·   Understand these instructions.  Will watch your condition.  Will get help right away if you are not doing well or get worse. Document Released: 07/13/2005 Document Revised: 10/05/2011 Document Reviewed: 02/29/2008 [facility] Patient Information 2015 [place], [place]. This information is not intended to replace advice given to you by your health care provider. Make sure you discuss any questions you have with your health care  provider. Otitis Media Otitis media is redness, soreness, and inflammation of the middle ear. Otitis media may be caused by allergies or, most commonly, by infection. Often it occurs as a complication of the common cold. SIGNS AND SYMPTOMS Symptoms of otitis media may include:  Earache.  Fever.  Ringing in your ear.  Headache.  Leakage of fluid from the ear. DIAGNOSIS To diagnose otitis media, your health care provider will examine your ear with an otoscope. This is an instrument that allows your health care provider to see into your ear in order to examine your eardrum. Your health care provider also will ask you questions about your symptoms. TREATMENT  Typically, otitis media resolves on its own within 3-5 days. Your health care provider may prescribe medicine to ease your symptoms of pain. If otitis media does not resolve within 5 days or is recurrent, your health care provider may prescribe antibiotic medicines if he or she suspects that a bacterial infection is the cause. HOME CARE INSTRUCTIONS   If you were prescribed an antibiotic medicine, finish it all even if you start to feel better.  Take medicines only as directed by your health care provider.  Keep all follow-up visits as directed by your health care provider. SEEK MEDICAL CARE IF:  You have otitis media only in one ear, or bleeding from your nose, or both.  You notice a lump on your neck.  You are not getting better in 3-5 days.  You feel worse instead of better. SEEK IMMEDIATE MEDICAL CARE IF:   You have pain that is not controlled with medicine.  You have swelling, redness, or pain around your ear or stiffness in your neck.  You notice that part of your face is paralyzed.  You notice that the bone behind your ear (mastoid) is tender when you touch it. MAKE SURE YOU:   Understand these instructions.  Will watch your condition.  Will get help right away if you are not doing well or get  worse. Document Released: 04/17/2004 Document Revised: 11/27/2013 Document Reviewed: 02/07/2013 [facility] Patient Information 2015 [place], [place]. This information is not intended to replace advice given to you by your health care provider. Make sure you discuss any questions you have with your health care provider.

## 2015-01-02 NOTE — ED Provider Notes (Signed)
CSN: [id number]     Arrival date & time 01/02/15  1916 History   First MD Initiated Contact with Patient 01/02/15 1927     Chief Complaint  Patient presents with  . Otalgia  . Ankle Pain   (Consider location/radiation/quality/duration/timing/severity/associated sxs/prior Treatment) Patient is a 34 y.o. female presenting with ear pain and ankle pain. The history is provided by the patient. No language interpreter was used.  Otalgia Location:  Left Quality:  Aching Severity:  Moderate Onset quality:  Gradual Duration:  2 weeks Timing:  Constant Progression:  Worsening Chronicity:  Recurrent Relieved by:  Nothing Worsened by:  Nothing tried Ineffective treatments:  None tried Associated symptoms: congestion   Ankle Pain Pt also complains of pain to lateral ankle no injury.   Pain to palpation,   Pt has been using an ace wrap with some relief  Past Medical History  Diagnosis Date  . Asthma, chronic 12/27/2013  . Essential hypertension, benign 12/27/2013  . Fibromyalgia 12/27/2013  . Leukocytosis 12/27/2013  . Perforation of left tympanic membrane 12/27/2013  . Bipolar 2 disorder   . PCOS (polycystic ovarian syndrome)    Past Surgical History  Procedure Laterality Date  . Cholecystectomy    . Wisdom tooth extraction     Family History  Problem Relation Age of Onset  . Depression Father   . Depression Sister    History  Substance Use Topics  . Smoking status: Former Research scientist (life sciences)  . Smokeless tobacco: Not on file     Comment: uses 6mg  vapes  . Alcohol Use: No   OB History    No data available     Review of Systems  HENT: Positive for congestion and ear pain.   All other systems reviewed and are negative.   Allergies  Celecoxib; Erythromycin; Lyrica; Morphine and related; and Penicillins  Home Medications   Prior to Admission medications   Medication Sig Start Date End Date Taking? Authorizing Provider  albuterol (PROVENTIL HFA;VENTOLIN HFA) 108 (90 BASE) MCG/ACT inhaler  Inhale 2 puffs into the lungs every 4 (four) hours as needed for wheezing. 06/07/14   Jacqulyn Cane, MD  albuterol (PROVENTIL) (2.5 MG/3ML) 0.083% nebulizer solution Take 3 mLs (2.5 mg total) by nebulization every 4 (four) hours as needed for wheezing. 06/07/14   Jacqulyn Cane, MD  atenolol-chlorthalidone (TENORETIC 100) 100-25 MG per tablet Take 1 tablet by mouth daily. 10/11/14   Marcial Pacas, DO  Fish Oil OIL by Does not apply route.    Historical Provider, MD  fluconazole (DIFLUCAN) 150 MG tablet Take 1 tablet (150 mg total) by mouth once. 12/12/14   Sean Hommel, DO  gabapentin (NEURONTIN) 600 MG tablet Take 1 tablet (600 mg total) by mouth 3 (three) times daily. 10/11/14   Sean Hommel, DO  ipratropium-albuterol (DUONEB) 0.5-2.5 (3) MG/3ML SOLN Take 3 mLs by nebulization every 4 (four) hours as needed (wheezing/cough/sob). 06/13/14   Marcial Pacas, DO  Multiple Vitamin (MULTIVITAMIN) capsule Take 1 capsule by mouth daily.    Historical Provider, MD  pantoprazole (PROTONIX) 40 MG tablet Take 1 tablet (40 mg total) by mouth daily. 10/11/14   Sean Hommel, DO  tiZANidine (ZANAFLEX) 4 MG tablet TAKE 1/2 TO 1 TABLET BY MOUTH EVERY 6 HOURS AS NEEDED FOR MUSCLE SPASMS 10/18/14   Sean Hommel, DO  topiramate (TOPAMAX) 25 MG tablet Take 2 tablets (50 mg total) by mouth at bedtime. 10/18/14 10/18/15  Merian Capron, MD   BP 129/83 mmHg  Pulse 74  Temp(Src) 97.9 F (  36.6 C) (Oral)  Resp 16  Ht 5\' 9"  (1.753 m)  Wt 273 lb (123.832 kg)  BMI 40.30 kg/m2  SpO2 97% Physical Exam  Constitutional: She appears well-developed.  HENT:  Right Ear: External ear normal.  Left tm gray,  No visible landmarks,   ?cholesteatoma.  Cardiovascular: Normal rate and normal heart sounds.   Pulmonary/Chest: Effort normal.  Abdominal: Soft.  Musculoskeletal: She exhibits tenderness.  Tender lateral ankle no deformity  nv and ns intact  Skin: Skin is warm.  Psychiatric: She has a normal mood and affect.    ED Course  Procedures  (including critical care time) Labs Review Labs Reviewed - No data to display  Imaging Review No results found.   MDM  Pt referred to Ent.   I will cover with antibiotics,   Pt given an aso and exercises for ankle.   Xray deferred as pt has not had an injury   1. Ankle pain, right   2. Recurrent acute serous otitis media of left ear     aso Exercises cefdiner Follow up with ENT and Dr. Charlotte Crumb, PA-C 01/02/15 [place], [place] 01/02/15 1952

## 2015-01-09 ENCOUNTER — Telehealth: Payer: Self-pay | Admitting: *Deleted

## 2015-01-09 MED ORDER — FLUCONAZOLE 150 MG PO TABS: 150.0000 mg | ORAL_TABLET | Freq: Once | ORAL | Status: DC

## 2015-01-09 NOTE — Telephone Encounter (Signed)
Pt states she has a yeast infection from abx prescribed in UC. She requests diflucan

## 2015-02-06 ENCOUNTER — Other Ambulatory Visit: Payer: Self-pay | Admitting: Family Medicine

## 2015-02-06 DIAGNOSIS — K2100 Gastro-esophageal reflux disease with esophagitis, without bleeding: Secondary | ICD-10-CM

## 2015-02-06 MED ORDER — PANTOPRAZOLE SODIUM 40 MG PO TBEC: 40.0000 mg | DELAYED_RELEASE_TABLET | Freq: Every day | ORAL | Status: DC

## 2015-02-26 ENCOUNTER — Ambulatory Visit (INDEPENDENT_AMBULATORY_CARE_PROVIDER_SITE_OTHER): Payer: BLUE CROSS/BLUE SHIELD | Admitting: Obstetrics & Gynecology

## 2015-02-26 ENCOUNTER — Encounter: Payer: Self-pay | Admitting: Obstetrics & Gynecology

## 2015-02-26 VITALS — BP 134/78 | HR 92 | Resp 16 | Ht 69.5 in | Wt 273.0 lb

## 2015-02-26 DIAGNOSIS — Z1151 Encounter for screening for human papillomavirus (HPV): Secondary | ICD-10-CM | POA: Diagnosis not present

## 2015-02-26 DIAGNOSIS — Z124 Encounter for screening for malignant neoplasm of cervix: Secondary | ICD-10-CM

## 2015-02-26 DIAGNOSIS — E282 Polycystic ovarian syndrome: Secondary | ICD-10-CM | POA: Diagnosis not present

## 2015-02-26 DIAGNOSIS — Z01419 Encounter for gynecological examination (general) (routine) without abnormal findings: Secondary | ICD-10-CM | POA: Diagnosis not present

## 2015-02-26 DIAGNOSIS — Z113 Encounter for screening for infections with a predominantly sexual mode of transmission: Secondary | ICD-10-CM

## 2015-02-26 DIAGNOSIS — Z0000 Encounter for general adult medical examination without abnormal findings: Secondary | ICD-10-CM

## 2015-02-26 MED ORDER — MEDROXYPROGESTERONE ACETATE 10 MG PO TABS: 10.0000 mg | ORAL_TABLET | Freq: Every day | ORAL | Status: DC

## 2015-02-26 NOTE — Addendum Note (Signed)
Addended by: Gretchen Short on: 02/26/2015 03:08 PM   Modules accepted: Orders
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
## 2015-02-27 ENCOUNTER — Other Ambulatory Visit (INDEPENDENT_AMBULATORY_CARE_PROVIDER_SITE_OTHER): Payer: BLUE CROSS/BLUE SHIELD

## 2015-02-27 DIAGNOSIS — Z01419 Encounter for gynecological examination (general) (routine) without abnormal findings: Secondary | ICD-10-CM | POA: Diagnosis not present

## 2015-02-27 LAB — CBC
HCT: 46.4 % — ABNORMAL HIGH (ref 36.0–46.0)
Hemoglobin: 15.9 g/dL — ABNORMAL HIGH (ref 12.0–15.0)
MCH: 30.5 pg (ref 26.0–34.0)
MCHC: 34.3 g/dL (ref 30.0–36.0)
MCV: 88.9 fL (ref 78.0–100.0)
MPV: 11.6 fL (ref 8.6–12.4)
Platelets: 263 10*3/uL (ref 150–400)
RBC: 5.22 MIL/uL — ABNORMAL HIGH (ref 3.87–5.11)
RDW: 13.9 % (ref 11.5–15.5)
WBC: 14.1 10*3/uL — ABNORMAL HIGH (ref 4.0–10.5)

## 2015-02-27 LAB — COMPREHENSIVE METABOLIC PANEL
ALBUMIN: 3.8 g/dL (ref 3.6–5.1)
ALT: 20 U/L (ref 6–29)
AST: 22 U/L (ref 10–30)
Alkaline Phosphatase: 54 U/L (ref 33–115)
BILIRUBIN TOTAL: 0.9 mg/dL (ref 0.2–1.2)
BUN: 11 mg/dL (ref 7–25)
CALCIUM: 9.2 mg/dL (ref 8.6–10.2)
CHLORIDE: 98 mmol/L (ref 98–110)
CO2: 24 mmol/L (ref 20–31)
Creat: 0.75 mg/dL (ref 0.50–1.10)
Glucose, Bld: 102 mg/dL — ABNORMAL HIGH (ref 65–99)
Potassium: 3.4 mmol/L — ABNORMAL LOW (ref 3.5–5.3)
Sodium: 135 mmol/L (ref 135–146)
TOTAL PROTEIN: 6.5 g/dL (ref 6.1–8.1)

## 2015-02-27 LAB — LIPID PANEL
Cholesterol: 213 mg/dL — ABNORMAL HIGH (ref 125–200)
HDL: 34 mg/dL — ABNORMAL LOW (ref >=46)
LDL Cholesterol: 137 mg/dL — ABNORMAL HIGH (ref <130)
Total CHOL/HDL Ratio: 6.3 Ratio — ABNORMAL HIGH (ref <=5.0)
Triglycerides: 210 mg/dL — ABNORMAL HIGH (ref <150)
VLDL: 42 mg/dL — ABNORMAL HIGH (ref <30)

## 2015-02-27 LAB — TSH: TSH: 1.748 u[IU]/mL (ref 0.350–4.500)

## 2015-02-28 LAB — TESTOSTERONE, FREE, TOTAL, SHBG
Sex Hormone Binding: 23 nmol/L (ref 17–124)
TESTOSTERONE FREE: 18.6 pg/mL — AB (ref 0.6–6.8)
Testosterone-% Free: 2.2 % (ref 0.4–2.4)
Testosterone: 84 ng/dL — ABNORMAL HIGH (ref 10–70)

## 2015-02-28 LAB — LUTEINIZING HORMONE: LH: 11.8 m[IU]/mL

## 2015-02-28 LAB — FOLLICLE STIMULATING HORMONE: FSH: 5.9 m[IU]/mL

## 2015-02-28 LAB — CYTOLOGY - PAP

## 2015-03-01 ENCOUNTER — Telehealth: Payer: Self-pay | Admitting: *Deleted

## 2015-03-01 NOTE — Telephone Encounter (Signed)
LM on voicemail concerning labs and needs to see PCP to address Lipids per Dr Hulan Fray.

## 2015-03-07 ENCOUNTER — Encounter: Payer: Self-pay | Admitting: Family Medicine

## 2015-03-07 ENCOUNTER — Ambulatory Visit (INDEPENDENT_AMBULATORY_CARE_PROVIDER_SITE_OTHER): Payer: BLUE CROSS/BLUE SHIELD | Admitting: Family Medicine

## 2015-03-07 VITALS — BP 123/84 | HR 90 | Ht 69.5 in | Wt 278.0 lb

## 2015-03-07 DIAGNOSIS — E785 Hyperlipidemia, unspecified: Secondary | ICD-10-CM | POA: Diagnosis not present

## 2015-03-07 DIAGNOSIS — G43809 Other migraine, not intractable, without status migrainosus: Secondary | ICD-10-CM | POA: Insufficient documentation

## 2015-03-07 MED ORDER — TOPIRAMATE 50 MG PO TABS: ORAL_TABLET | ORAL | Status: DC

## 2015-03-07 MED ORDER — METHYLPREDNISOLONE ACETATE 80 MG/ML IJ SUSP
80.0000 mg | Freq: Once | INTRAMUSCULAR | Status: AC
  Administered 2015-03-07: 80 mg via INTRAMUSCULAR

## 2015-03-07 MED ORDER — KETOROLAC TROMETHAMINE 60 MG/2ML IM SOLN
60.0000 mg | Freq: Once | INTRAMUSCULAR | Status: AC
  Administered 2015-03-07: 60 mg via INTRAMUSCULAR
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
## 2015-03-14 ENCOUNTER — Other Ambulatory Visit: Payer: Self-pay | Admitting: Family Medicine

## 2015-03-27 ENCOUNTER — Ambulatory Visit: Payer: Self-pay | Admitting: Family Medicine

## 2015-04-10 ENCOUNTER — Telehealth: Payer: Self-pay | Admitting: *Deleted

## 2015-04-10 NOTE — Telephone Encounter (Signed)
Pt states she has thrush and this is an ongoing problem. She is requesting nystatin. i did tell her that we could go ahead and call this in but she will need to come in for an appt.

## 2015-04-11 MED ORDER — NYSTATIN 100000 UNIT/ML MT SUSP: 5.0000 mL | Freq: Four times a day (QID) | OROMUCOSAL | Status: DC

## 2015-04-11 NOTE — Telephone Encounter (Signed)
Rx sent to [facility]

## 2015-06-14 ENCOUNTER — Ambulatory Visit (INDEPENDENT_AMBULATORY_CARE_PROVIDER_SITE_OTHER): Payer: BLUE CROSS/BLUE SHIELD | Admitting: Osteopathic Medicine

## 2015-06-14 ENCOUNTER — Encounter: Payer: Self-pay | Admitting: Osteopathic Medicine

## 2015-06-14 ENCOUNTER — Other Ambulatory Visit: Payer: Self-pay | Admitting: Family Medicine

## 2015-06-14 ENCOUNTER — Other Ambulatory Visit: Payer: Self-pay | Admitting: Osteopathic Medicine

## 2015-06-14 VITALS — BP 120/78 | HR 74 | Ht 69.0 in | Wt 248.0 lb

## 2015-06-14 DIAGNOSIS — R102 Pelvic and perineal pain: Secondary | ICD-10-CM

## 2015-06-14 DIAGNOSIS — R221 Localized swelling, mass and lump, neck: Secondary | ICD-10-CM | POA: Diagnosis not present

## 2015-06-14 DIAGNOSIS — L732 Hidradenitis suppurativa: Secondary | ICD-10-CM

## 2015-06-14 DIAGNOSIS — R112 Nausea with vomiting, unspecified: Secondary | ICD-10-CM

## 2015-06-14 LAB — WET PREP FOR TRICH, YEAST, CLUE
CLUE CELLS WET PREP: NONE SEEN
Trich, Wet Prep: NONE SEEN
WBC WET PREP: NONE SEEN

## 2015-06-14 LAB — POCT URINALYSIS DIPSTICK
BILIRUBIN UA: NEGATIVE
GLUCOSE UA: NEGATIVE
KETONES UA: NEGATIVE
LEUKOCYTES UA: NEGATIVE
Nitrite, UA: NEGATIVE
PH UA: 7
Spec Grav, UA: >=1.03
Urobilinogen, UA: 1

## 2015-06-14 MED ORDER — OXYCODONE-ACETAMINOPHEN 5-325 MG PO TABS: 1.0000 | ORAL_TABLET | Freq: Four times a day (QID) | ORAL | Status: DC | PRN

## 2015-06-14 MED ORDER — PROMETHAZINE HCL 25 MG PO TABS: 25.0000 mg | ORAL_TABLET | Freq: Four times a day (QID) | ORAL | Status: DC | PRN

## 2015-06-14 MED ORDER — CLINDAMYCIN HCL 300 MG PO CAPS: 300.0000 mg | ORAL_CAPSULE | Freq: Three times a day (TID) | ORAL | Status: DC

## 2015-06-14 NOTE — Patient Instructions (Signed)
Keep follow-up appointment with OB/GYN for Monday. Take all medications as instructed. These medications may make her tired or dizzy, stop them if you experience any concerning side effects. Take all antibiotics even if feeling better, unless OB/GYN changes these or tells you otherwise.Marland Kitchen

## 2015-06-14 NOTE — Progress Notes (Signed)
HPI: Margaret Lawson is a 34 y.o. female who presents to [place]  today for chief complaint of:  Chief Complaint  Patient presents with  . Vaginal Pain   . Location: L vagina and clitoris . Quality: sore, swelling, pain . Severity: severe . Duration: 1 week . Timing: constant . Context: no injury, no unusually rough sex, no dyspreunia . Modifying factors: takes neurontin which numbs area somewhat, not sure if she may have injured it . Assoc signs/symptoms: last period was late, 05/28/15, heavier than usual. Hx PCOS. Patient reports pain causing severe nausea, vomiting 1 this morning   Past medical, social and family history reviewed: Past Medical History  Diagnosis Date  . Asthma, chronic 12/27/2013  . Essential hypertension, benign 12/27/2013  . Fibromyalgia 12/27/2013  . Leukocytosis 12/27/2013  . Perforation of left tympanic membrane 12/27/2013  . Bipolar 2 disorder ([place])   . PCOS (polycystic ovarian syndrome)    Past Surgical History  Procedure Laterality Date  . Cholecystectomy    . Wisdom tooth extraction     Social History  Substance Use Topics  . Smoking status: Current Every Day Smoker -- 1.00 packs/day for 20 years    Types: Cigarettes  . Smokeless tobacco: Never Used     Comment: uses 6mg  vapes  . Alcohol Use: No   Family History  Problem Relation Age of Onset  . Depression Father   . Depression Sister   . Heart disease Father   . Mitral valve prolapse Father   . Aneurysm Mother     brain  . Diabetes Father   . Hypertension Father     Current Outpatient Prescriptions  Medication Sig Dispense Refill  . albuterol (PROVENTIL HFA;VENTOLIN HFA) 108 (90 BASE) MCG/ACT inhaler Inhale 2 puffs into the lungs every 4 (four) hours as needed for wheezing. 18 g 0  . albuterol (PROVENTIL) (2.5 MG/3ML) 0.083% nebulizer solution Take 3 mLs (2.5 mg total) by nebulization every 4 (four) hours as needed for wheezing. 75 mL 0  .  atenolol-chlorthalidone (TENORETIC) 100-25 MG per tablet TAKE 1 TABLET BY MOUTH DAILY 30 tablet 5  . cetirizine (ZYRTEC) 10 MG tablet Take 10 mg by mouth daily.    . famotidine (PEPCID) 20 MG tablet Take 20 mg by mouth 2 (two) times daily.    . Fish Oil OIL by Does not apply route.    . gabapentin (NEURONTIN) 600 MG tablet TAKE 1 TABLET BY MOUTH THREE TIMES DAILY 270 tablet 0  . ipratropium-albuterol (DUONEB) 0.5-2.5 (3) MG/3ML SOLN Take 3 mLs by nebulization every 4 (four) hours as needed (wheezing/cough/sob). 360 mL 1  . Multiple Vitamin (MULTIVITAMIN) capsule Take 1 capsule by mouth daily.    Marland Kitchen nystatin (MYCOSTATIN) 100000 UNIT/ML suspension Take 5 mLs (500,000 Units total) by mouth 4 (four) times daily. Swish in mouth and swallow. Use until asymptomatic from thrush for two days. 120 mL 1  . pantoprazole (PROTONIX) 40 MG tablet TAKE 1 TABLET BY MOUTH DAILY 90 tablet 0  . tiZANidine (ZANAFLEX) 4 MG tablet TAKE 1/2 TO 1 TABLET BY MOUTH EVERY 6 HOURS AS NEEDED FOR MUSCLE SPASMS 120 tablet 5  . topiramate (TOPAMAX) 50 MG tablet One tablet one to two times a day to help prevent migraines. 60 tablet 2  . [DISCONTINUED] OLANZapine (ZYPREXA) 5 MG tablet Take 5 mg by mouth at bedtime.     No current facility-administered medications for this visit.   Allergies  Allergen Reactions  . Celecoxib  Nausea And Vomiting  . Erythromycin Nausea And Vomiting  . Latex   . Lyrica [Pregabalin]     Induced lactation  . Morphine And Related     Decrease bp  . Penicillins       Review of Systems: CONSTITUTIONAL:  No  fever, no chills, No  unintentional weight changes HEAD/EYES/EARS/NOSE/THROAT: No headache CARDIAC: No chest painRESPIRATORY: No  cough, No  shortness of breath/wheeze GASTROINTESTINAL: (+) nausea, (+) vomiting x1, no abdominal pain, no blood in stool, no diarrhea, no constipation GENITOURINARY: No incontinence, (+) abnormal genital bleeding, no abnormal discharge, swelling and pain as per  history of present illness SKIN: No rash/wounds/concerning lesions, patient has history of cysts/boils on groin HEM/ONC: No easy bruising/bleeding, no abnormal lymph node    Exam:  BP 120/78 mmHg  Pulse 74  Ht 5\' 9"  (1.753 m)  Wt 248 lb (112.492 kg)  BMI 36.61 kg/m2 Constitutional: VSS, see above. General Appearance: alert, well-developed, well-nourished, NAD GYN: Examination of external vagina demonstrates positive abscess versus cyst versus hematoma to the left of the clitoris, just anterior to the urethra. Urethra appears normal, no blood or urethral polyps. Patient is exquisitely tender in this area, unable to determine if fluctuants versus induration. Skin on groin exhibits scar tissue presumably from hidradenitis. No other lesions/ulcers to external genitalia, normal urethra, normal vaginal mucosa, physiologic discharge with blood present in the vagina, cervix normal without lesions, uterus not enlarged or tender, adnexa no masses and nontender. LYMPH: No lymphadenopathy in groin   Results for orders placed or performed in visit on 06/14/15 (from the past 72 hour(s))  POCT Urinalysis Dipstick     Status: None   Collection Time: 06/14/15  9:07 AM  Result Value Ref Range   Color, UA yellow    Clarity, UA clear    Glucose, UA neg    Bilirubin, UA neg    Ketones, UA neg    Spec Grav, UA >=1.030    Blood, UA large    pH, UA 7.0    Protein, UA 100mg     Urobilinogen, UA 1.0    Nitrite, UA neg    Leukocytes, UA Negative Negative      ASSESSMENT/PLAN: Unable to determine if swelling near clitoris as noted in physical exam is due to abscess versus hematoma versus other, and I am not comfortable performing incision and drainage near sensitive organs urethra and clitoris. Patient has an appointment with OB/GYN on Monday. In the meantime we will prescribe antibiotics, pain medication, cultures as below. Patient to follow-up with OB/GYN or TR over the weekend if symptoms  worsen.  Vaginal pain - Plan: oxyCODONE-acetaminophen (ROXICET) 5-325 MG tablet, Urine culture, GC/Chlamydia Probe Amp, Wet prep, genital,   Swollen uvula - Plan: POCT Urinalysis Dipstick  Hidradenitis suppurativa - Plan: clindamycin (CLEOCIN) 300 MG capsule  Non-intractable vomiting with nausea, vomiting of unspecified type - Plan: promethazine (PHENERGAN) 25 MG tablet    Return if symptoms worsen or fail to improve, for as instructed by Dr Ileene Rubens for routine care.

## 2015-06-15 LAB — GC/CHLAMYDIA PROBE AMP
CT Probe RNA: NEGATIVE
GC PROBE AMP APTIMA: NEGATIVE

## 2015-06-16 LAB — URINE CULTURE: Colony Count: 30000

## 2015-06-17 ENCOUNTER — Encounter: Payer: Self-pay | Admitting: *Deleted

## 2015-06-17 ENCOUNTER — Encounter: Payer: Self-pay | Admitting: Obstetrics & Gynecology

## 2015-06-17 ENCOUNTER — Ambulatory Visit (INDEPENDENT_AMBULATORY_CARE_PROVIDER_SITE_OTHER): Payer: BLUE CROSS/BLUE SHIELD | Admitting: Obstetrics & Gynecology

## 2015-06-17 VITALS — BP 128/79 | HR 70 | Resp 16 | Ht 69.0 in | Wt 248.0 lb

## 2015-06-17 DIAGNOSIS — B373 Candidiasis of vulva and vagina: Secondary | ICD-10-CM | POA: Diagnosis not present

## 2015-06-17 DIAGNOSIS — R229 Localized swelling, mass and lump, unspecified: Secondary | ICD-10-CM

## 2015-06-17 DIAGNOSIS — E282 Polycystic ovarian syndrome: Secondary | ICD-10-CM

## 2015-06-17 DIAGNOSIS — R102 Pelvic and perineal pain: Secondary | ICD-10-CM

## 2015-06-17 MED ORDER — VALACYCLOVIR HCL 500 MG PO TABS: 500.0000 mg | ORAL_TABLET | Freq: Two times a day (BID) | ORAL | Status: DC

## 2015-06-17 MED ORDER — FLUCONAZOLE 150 MG PO TABS: ORAL_TABLET | ORAL | Status: DC

## 2015-06-17 NOTE — Progress Notes (Signed)
   Subjective:    Patient ID: Margaret Lawson, female    DOB: 1981-04-04, 34 y.o.   MRN: [id number]  HPI  34 yo female presents for f/u of PCO.  Pt did take one pill of provera and then bled for 13 days in August.  Pt had a period in September.  Pt skipped October and then had 2 days BRB in early November and then spotting.    Pt thought her clitoris was bleeding at some oint (maybe last week).  Pt is foggy due to her fibromyalgia.  Her clitoris was swollen.  She thought it was one her "labial cysts or abscess or ingrown hair."  Patient is having severe pain in her clitoris she also feels like it still swollen. Patient was placed on clindamycin by her primary care provider. She thinks help some. The lesion seemed to stick to her underwear. She can placing petroleum jelly on her vulva/clitoris help prevent this from happening. Patient denies trauma. She does masturbate. She doesn't know if she she cut herself with her fingernail.  Review of Systems  Constitutional: Negative.   Cardiovascular: Negative.   Gastrointestinal: Negative.   Endocrine: Negative.   Genitourinary: Positive for vaginal discharge, vaginal pain and menstrual problem. Negative for pelvic pain.  Neurological:       Mental fog  Psychiatric/Behavioral: Negative.    Past Medical History  Diagnosis Date  . Asthma, chronic 12/27/2013  . Essential hypertension, benign 12/27/2013  . Fibromyalgia 12/27/2013  . Leukocytosis 12/27/2013  . Perforation of left tympanic membrane 12/27/2013  . Bipolar 2 disorder ([place])   . PCOS (polycystic ovarian syndrome)        Objective:   Physical Exam  Constitutional: She is oriented to person, place, and time. She appears well-developed and well-nourished. No distress.  HENT:  Head: Normocephalic and atraumatic.  Eyes: Conjunctivae are normal.  Pulmonary/Chest: Effort normal.  Abdominal: Soft. Bowel sounds are normal. She exhibits no distension and no mass. There is no tenderness. There is no  rebound and no guarding.  Genitourinary:     Musculoskeletal: She exhibits no edema.  Neurological: She is alert and oriented to person, place, and time.  Skin: Skin is warm and dry.  Psychiatric: She has a normal mood and affect.  Vitals reviewed.         Assessment & Plan:  34 year old female with multiple problems  1   Polycystic ovarian syndrome. Patient did not take Provera. Patient having some irregular bleeding. This will need to be investigated with endometrial biopsy at some point.  2  clitoral swelling. An 18-gauge needle was used to drain the fluctuant area. Blood was removed from the lesion. There was no pus. Ice pack to clitoral area 3 times a day.  Vaginal rest  3.  Lesions could possibly be HSV. This was tested for today.  Continue clinda.;  4.  Vulvar area looks positive for yeast. BD affirm sent. Patient also treated empirically with Diflucan.  5.  RTC 1 week

## 2015-06-18 ENCOUNTER — Telehealth: Payer: Self-pay | Admitting: *Deleted

## 2015-06-18 LAB — WET PREP BY MOLECULAR PROBE
Candida species: POSITIVE — AB
GARDNERELLA VAGINALIS: NEGATIVE
TRICHOMONAS VAG: NEGATIVE

## 2015-06-18 NOTE — Telephone Encounter (Signed)
Called pt to adv Diflucan sent to pharmacy for vaginal yeast infection. LMOM for pt to rtn call if needed.

## 2015-06-19 LAB — HERPES SIMPLEX VIRUS CULTURE: Organism ID, Bacteria: DETECTED

## 2015-06-21 ENCOUNTER — Encounter: Payer: Self-pay | Admitting: Obstetrics & Gynecology

## 2015-06-21 DIAGNOSIS — B009 Herpesviral infection, unspecified: Secondary | ICD-10-CM | POA: Insufficient documentation

## 2015-06-24 ENCOUNTER — Telehealth: Payer: Self-pay | Admitting: *Deleted

## 2015-06-24 NOTE — Telephone Encounter (Signed)
LM on voicemail to call office concerning recent lab results.

## 2015-06-24 NOTE — Telephone Encounter (Signed)
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  RN to call to inform patient.  Will discuss further at next visit.

## 2015-06-25 ENCOUNTER — Ambulatory Visit (INDEPENDENT_AMBULATORY_CARE_PROVIDER_SITE_OTHER): Payer: BLUE CROSS/BLUE SHIELD | Admitting: Obstetrics & Gynecology

## 2015-06-25 ENCOUNTER — Encounter: Payer: Self-pay | Admitting: Obstetrics & Gynecology

## 2015-06-25 VITALS — BP 114/73 | HR 84 | Resp 16 | Ht 69.0 in | Wt 245.0 lb

## 2015-06-25 DIAGNOSIS — B373 Candidiasis of vulva and vagina: Secondary | ICD-10-CM

## 2015-06-25 DIAGNOSIS — D72829 Elevated white blood cell count, unspecified: Secondary | ICD-10-CM | POA: Diagnosis not present

## 2015-06-25 DIAGNOSIS — N921 Excessive and frequent menstruation with irregular cycle: Secondary | ICD-10-CM

## 2015-06-25 DIAGNOSIS — B3731 Acute candidiasis of vulva and vagina: Secondary | ICD-10-CM

## 2015-06-25 MED ORDER — VALACYCLOVIR HCL 500 MG PO TABS: 500.0000 mg | ORAL_TABLET | Freq: Every day | ORAL | Status: DC

## 2015-06-25 MED ORDER — MISOPROSTOL 200 MCG PO TABS: ORAL_TABLET | ORAL | Status: DC

## 2015-06-25 NOTE — Progress Notes (Signed)
Subjective:    Patient ID: Margaret Lawson, female    DOB: 1981-03-17, 34 y.o.   MRN: [id number]  HPI  Vinnie Langton is a 34 year old nulliparous patient who presents for follow-up of multiple problems as outlined below  1-vulvar lesions. 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 We had extensive conversation on transmission and went over her sexual history. She has had 6 partners total. 4 female into her mail. One of her female partners had multiple partners and she believes she probably contracted HSV-1 from this partner.  We discussed suppression with Valtrex and she does desire suppression.  Patient's vulvar is much more comfortable at this time. She was also treated for yeast and no longer has the symptoms.  2-PCO.  She continues to have irregular bleeding that is of concern to her she does have laboratory work consistent with polycystic ovarian syndrome. She's never had an ultrasound or endometrial biopsy. Given her long history of PCO, many months of amenorrhea, and now irregular bleeding that is not improving, I suggest individual biopsy and transvaginal ultrasound.  3-depression. There is any problem to me. Patient states that her depression is worsening. She has been in treatment in the past but has not been on medications are seen a psychiatrist for some time. She believes she was diagnosed with bipolar type II. Her reason for living with her husband. She does not have suicidal homicidal ideations, however she does feel depressed to stay in bed. She has decreased appetite.  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 She feels like her chronic pain brings her down.  I strongly suggest that she see a psychiatrist and start counseling again. Patient is agreeable to this.  4-elevated white count.  Patient still concerned that she has an elevated white count and has not been adequately worked up. I will draw a CBC today and would have her return to her PCP.  5-chronic yeast. Patient  has had multiple episodes of vaginitis. I think we should test her hemoglobin A1c to see if she is diabetic. I do not see rate recent test in her record. Does have family history positive for type 2 diabetes in her father.   Review of Systems  Constitutional: Positive for fatigue.  HENT:       Coating on her tongue  Eyes: Negative.   Respiratory: Positive for wheezing.   Cardiovascular: Negative.   Gastrointestinal: Negative for nausea, vomiting, abdominal pain and diarrhea.  Endocrine: Negative.   Genitourinary: Positive for vaginal bleeding and menstrual problem.  Musculoskeletal: Positive for myalgias.  Allergic/Immunologic: Negative.   Psychiatric/Behavioral: Positive for sleep disturbance and dysphoric mood. Negative for suicidal ideas and self-injury. The patient is nervous/anxious.    Past Medical History  Diagnosis Date  . Asthma, chronic 12/27/2013  . Essential hypertension, benign 12/27/2013  . Fibromyalgia 12/27/2013  . Leukocytosis 12/27/2013  . Perforation of left tympanic membrane 12/27/2013  . Bipolar 2 disorder ([place])   . PCOS (polycystic ovarian syndrome)          Objective:   Physical Exam  Constitutional: She is oriented to person, place, and time. She appears well-developed and well-nourished. No distress.  HENT:  Head: Normocephalic and atraumatic.  Eyes: Conjunctivae are normal.  Pulmonary/Chest: Effort normal.  Abdominal: Soft. There is no tenderness.  Genitourinary:  Pt defers exam today.  Musculoskeletal: She exhibits no edema.  Neurological: She is alert and oriented to person, place, and time.  Skin: Skin is warm and dry.  Psychiatric: She has a normal mood and affect.  Vitals reviewed.      Assessment & Plan:  Vulvar lesion / HSV-Valtrex suppression  Recurrent yeast-Hgb A1C  PCO--weight loss, TVUS, Endometrial biopsy  Depression / bipolar--counseling and meds--referrals given.  Leukocytosis--rpt CBC, f/u with heme if elevated.

## 2015-06-26 LAB — CBC WITH DIFFERENTIAL/PLATELET
BASOS ABS: 0 10*3/uL (ref 0.0–0.1)
Basophils Relative: 0 % (ref 0–1)
Eosinophils Absolute: 0.1 10*3/uL (ref 0.0–0.7)
Eosinophils Relative: 1 % (ref 0–5)
HEMATOCRIT: 47.7 % — AB (ref 36.0–46.0)
Hemoglobin: 16.4 g/dL — ABNORMAL HIGH (ref 12.0–15.0)
LYMPHS ABS: 4.4 10*3/uL — AB (ref 0.7–4.0)
LYMPHS PCT: 30 % (ref 12–46)
MCH: 30.4 pg (ref 26.0–34.0)
MCHC: 34.4 g/dL (ref 30.0–36.0)
MCV: 88.5 fL (ref 78.0–100.0)
MPV: 11.6 fL (ref 8.6–12.4)
Monocytes Absolute: 0.9 10*3/uL (ref 0.1–1.0)
Monocytes Relative: 6 % (ref 3–12)
NEUTROS PCT: 63 % (ref 43–77)
Neutro Abs: 9.2 10*3/uL — ABNORMAL HIGH (ref 1.7–7.7)
Platelets: 245 10*3/uL (ref 150–400)
RBC: 5.39 MIL/uL — ABNORMAL HIGH (ref 3.87–5.11)
RDW: 14.4 % (ref 11.5–15.5)
WBC: 14.6 10*3/uL — ABNORMAL HIGH (ref 4.0–10.5)

## 2015-06-26 LAB — HEMOGLOBIN A1C
Hgb A1c MFr Bld: 5.8 % — ABNORMAL HIGH (ref <5.7)
Mean Plasma Glucose: 120 mg/dL — ABNORMAL HIGH (ref <117)

## 2015-07-01 ENCOUNTER — Telehealth: Payer: Self-pay | Admitting: *Deleted

## 2015-07-01 ENCOUNTER — Ambulatory Visit: Payer: Self-pay | Admitting: Osteopathic Medicine

## 2015-07-01 ENCOUNTER — Telehealth: Payer: Self-pay | Admitting: Family Medicine

## 2015-07-01 ENCOUNTER — Encounter: Payer: Self-pay | Admitting: Family Medicine

## 2015-07-01 ENCOUNTER — Other Ambulatory Visit: Payer: Self-pay | Admitting: Family Medicine

## 2015-07-01 ENCOUNTER — Ambulatory Visit (INDEPENDENT_AMBULATORY_CARE_PROVIDER_SITE_OTHER): Payer: BLUE CROSS/BLUE SHIELD | Admitting: Family Medicine

## 2015-07-01 VITALS — BP 138/89 | HR 69 | Temp 97.8°F | Wt 243.0 lb

## 2015-07-01 DIAGNOSIS — A499 Bacterial infection, unspecified: Secondary | ICD-10-CM | POA: Diagnosis not present

## 2015-07-01 DIAGNOSIS — F3130 Bipolar disorder, current episode depressed, mild or moderate severity, unspecified: Secondary | ICD-10-CM

## 2015-07-01 DIAGNOSIS — J329 Chronic sinusitis, unspecified: Secondary | ICD-10-CM

## 2015-07-01 DIAGNOSIS — B9689 Other specified bacterial agents as the cause of diseases classified elsewhere: Secondary | ICD-10-CM

## 2015-07-01 DIAGNOSIS — D72829 Elevated white blood cell count, unspecified: Secondary | ICD-10-CM

## 2015-07-01 DIAGNOSIS — F319 Bipolar disorder, unspecified: Secondary | ICD-10-CM

## 2015-07-01 MED ORDER — FLUCONAZOLE 150 MG PO TABS: ORAL_TABLET | ORAL | Status: AC

## 2015-07-01 MED ORDER — METHYLPREDNISOLONE ACETATE 80 MG/ML IJ SUSP
80.0000 mg | Freq: Once | INTRAMUSCULAR | Status: AC
  Administered 2015-07-01: 80 mg via INTRAMUSCULAR

## 2015-07-01 MED ORDER — DOXYCYCLINE HYCLATE 100 MG PO TABS: 100.0000 mg | ORAL_TABLET | Freq: Two times a day (BID) | ORAL | Status: DC

## 2015-07-01 MED ORDER — LURASIDONE HCL 20 MG PO TABS: 20.0000 mg | ORAL_TABLET | Freq: Every day | ORAL | Status: DC

## 2015-07-01 NOTE — Telephone Encounter (Signed)
I'm willing to continue seeing her.

## 2015-07-01 NOTE — Telephone Encounter (Signed)
-----   Message from Guss Bunde, MD sent at 06/26/2015  9:04 AM EST ----- Want to make sure that we are looking into mental health provider for her.    Her WBC and Hgb are still elevated.  She would lke to see hematologist again.  Would you help her get an appt and let her know her results.  Thx,  KHL

## 2015-07-01 NOTE — Telephone Encounter (Signed)
Entered Hematology Referral

## 2015-07-01 NOTE — Telephone Encounter (Signed)
I saw this paitent once for acute female complaint, she should continue with Dr Ileene Rubens for chronic medical problems.

## 2015-07-01 NOTE — Telephone Encounter (Signed)
Dr. Ileene Rubens, Pt would like to switch to Dr Sheppard Coil. Is this okay with you?

## 2015-07-01 NOTE — Progress Notes (Signed)
CC: Margaret Lawson is a 34 y.o. female is here for Sinusitis and Depression   Subjective: HPI:   cough, wheezing, nasal congestion and facial pressure that present for little over a week. Slight improvement when using duo nebs. Nothing else seems to make better or worse. Symptoms have slowly been worsening since onset. She denies fevers or chills. Occasional shortness of breath with exertion. No chest pain.  She is requesting medication to help with what she believes is depression. Last week she found out that she has a herpes virus, she also found out that she needs to have a endometrial biopsy and ultrasound, she's convinced herself that she probably has uterine cancer. She tells me she can't find happiness in life and has not wanted to harm herself or others however she feels lost.  Review Of Systems Outlined In HPI  Past Medical History  Diagnosis Date  . Asthma, chronic 12/27/2013  . Essential hypertension, benign 12/27/2013  . Fibromyalgia 12/27/2013  . Leukocytosis 12/27/2013  . Perforation of left tympanic membrane 12/27/2013  . Bipolar 2 disorder ([place])   . PCOS (polycystic ovarian syndrome)     Past Surgical History  Procedure Laterality Date  . Cholecystectomy    . Wisdom tooth extraction     Family History  Problem Relation Age of Onset  . Depression Father   . Depression Sister   . Heart disease Father   . Mitral valve prolapse Father   . Aneurysm Mother     brain  . Diabetes Father   . Hypertension Father   . Breast cancer Paternal Grandfather   . Breast cancer Paternal Aunt     Social History   Social History  . Marital Status: Married    Spouse Name: N/A  . Number of Children: N/A  . Years of Education: N/A   Occupational History  . unemployed    Social History Main Topics  . Smoking status: Current Every Day Smoker -- 1.00 packs/day for 20 years    Types: Cigarettes  . Smokeless tobacco: Never Used     Comment: uses 6mg  vapes  . Alcohol Use: No  .  Drug Use: No  . Sexual Activity:    Partners: Male    Birth Control/ Protection: None   Other Topics Concern  . Not on file   Social History Narrative     Objective: BP 138/89 mmHg  Pulse 69  Temp(Src) 97.8 F (36.6 C) (Oral)  Wt 243 lb (110.224 kg)  LMP 05/28/2015  General: Alert and Oriented, No Acute Distress HEENT: Pupils equal, round, reactive to light. Conjunctivae clear.  Moist mucous membranes pharynx unremarkable Lungs: Clear to auscultation bilaterally, no wheezing/ronchi/rales.  Comfortable work of breathing. Good air movement. Cardiac: Regular rate and rhythm. Normal S1/S2.  No murmurs, rubs, nor gallops.   Extremities: No peripheral edema.  Strong peripheral pulses.  Mental Status: mild to moderately depressed. No anxiety, nor agitation. Skin: Warm and dry.  Assessment & Plan: Tahia was seen today for sinusitis and depression.  Diagnoses and all orders for this visit:  Bipolar depression ([place]) -     lurasidone (LATUDA) 20 MG TABS tablet; Take 1 tablet (20 mg total) by mouth daily.  Bacterial sinusitis -     doxycycline (VIBRA-TABS) 100 MG tablet; Take 1 tablet (100 mg total) by mouth 2 (two) times daily. -     fluconazole (DIFLUCAN) 150 MG tablet; Take one tab, may take second tab if no improvement after 72 hours. -  methylPREDNISolone acetate (DEPO-MEDROL) injection 80 mg; Inject 1 mL (80 mg total) into the muscle once.   Bipolar depression: I agreed she should probably be on some sort of mood stabilizer, she has a history of bipolar disorder and has proposed [place] which seems reasonable. Spectral sinusitis: Start doxycycline with a Depo-Medrol shot. She anticipates getting a yeast infection from this medication therefore Diflucan was sent to her pharmacy  25 minutes spent face-to-face during visit today of which at least 50% was counseling or coordinating care regarding: 1. Bipolar depression ([place])   2. Bacterial sinusitis      Return if  symptoms worsen or fail to improve.

## 2015-07-03 ENCOUNTER — Telehealth (HOSPITAL_COMMUNITY): Payer: Self-pay | Admitting: *Deleted

## 2015-07-03 ENCOUNTER — Telehealth: Payer: Self-pay | Admitting: *Deleted

## 2015-07-03 ENCOUNTER — Telehealth (HOSPITAL_COMMUNITY): Payer: Self-pay

## 2015-07-03 NOTE — Telephone Encounter (Signed)
Tried several times to reach pt to follow up on [facility] provider - phone does not ring and unable to leave message.

## 2015-07-10 ENCOUNTER — Ambulatory Visit (HOSPITAL_COMMUNITY)
Admission: RE | Admit: 2015-07-10 | Discharge: 2015-07-10 | Disposition: A | Discharge status: Home / Self Care | Payer: BLUE CROSS/BLUE SHIELD | Source: Ambulatory Visit | Attending: Obstetrics & Gynecology | Admitting: Obstetrics & Gynecology

## 2015-07-10 ENCOUNTER — Ambulatory Visit (HOSPITAL_COMMUNITY): Payer: BLUE CROSS/BLUE SHIELD

## 2015-07-10 DIAGNOSIS — N83201 Unspecified ovarian cyst, right side: Secondary | ICD-10-CM | POA: Insufficient documentation

## 2015-07-10 DIAGNOSIS — E669 Obesity, unspecified: Secondary | ICD-10-CM | POA: Diagnosis not present

## 2015-07-10 DIAGNOSIS — N921 Excessive and frequent menstruation with irregular cycle: Secondary | ICD-10-CM | POA: Diagnosis present

## 2015-07-10 DIAGNOSIS — E282 Polycystic ovarian syndrome: Secondary | ICD-10-CM | POA: Insufficient documentation

## 2015-07-10 DIAGNOSIS — N912 Amenorrhea, unspecified: Secondary | ICD-10-CM | POA: Diagnosis not present

## 2015-07-15 ENCOUNTER — Encounter: Payer: Self-pay | Admitting: Obstetrics & Gynecology

## 2015-07-15 ENCOUNTER — Ambulatory Visit (INDEPENDENT_AMBULATORY_CARE_PROVIDER_SITE_OTHER): Payer: BLUE CROSS/BLUE SHIELD | Admitting: Obstetrics & Gynecology

## 2015-07-15 VITALS — BP 120/73 | HR 82 | Resp 16 | Ht 69.0 in | Wt 243.0 lb

## 2015-07-15 DIAGNOSIS — N921 Excessive and frequent menstruation with irregular cycle: Secondary | ICD-10-CM | POA: Diagnosis not present

## 2015-07-15 DIAGNOSIS — E282 Polycystic ovarian syndrome: Secondary | ICD-10-CM

## 2015-07-15 DIAGNOSIS — N912 Amenorrhea, unspecified: Secondary | ICD-10-CM

## 2015-07-15 DIAGNOSIS — N83201 Unspecified ovarian cyst, right side: Secondary | ICD-10-CM

## 2015-07-15 DIAGNOSIS — Z01812 Encounter for preprocedural laboratory examination: Secondary | ICD-10-CM

## 2015-07-15 LAB — POCT URINE PREGNANCY: Preg Test, Ur: NEGATIVE

## 2015-07-15 MED ORDER — MEDROXYPROGESTERONE ACETATE 10 MG PO TABS: ORAL_TABLET | ORAL | Status: DC

## 2015-07-15 NOTE — Progress Notes (Signed)
   Subjective:    Patient ID: Margaret Lawson, female    DOB: 11/19/1980, 34 y.o.   MRN: [id number]  HPI  Pt presents for endometrial biopsy.  She is bleeding and cramping today.  Pt believes bleeding is heavy.  Ibuprofen did help with pain and bleeding.  Review of Systems  Gastrointestinal: Negative.   Genitourinary: Positive for vaginal bleeding and pelvic pain.  Allergic/Immunologic: Positive for immunocompromised state.       Objective:   Physical Exam  Constitutional: She is oriented to person, place, and time. She appears well-developed and well-nourished. No distress.  HENT:  Head: Normocephalic and atraumatic.  Eyes: Conjunctivae are normal.  Pulmonary/Chest: Effort normal.  Abdominal: Soft. Bowel sounds are normal. She exhibits no distension and no mass. There is no tenderness. There is no rebound and no guarding.  Genitourinary: Vagina normal and uterus normal. No vaginal discharge found.  Blood in vault  Musculoskeletal: She exhibits no edema.  Neurological: She is alert and oriented to person, place, and time.  Skin: Skin is warm and dry.  Psychiatric: She has a normal mood and affect.  Vitals reviewed.         Assessment & Plan:  34 yo female with heavy bleeding today due to PCO and annovulatory cycles  1-Provera daily x14 days 2-rt ovarian cyst--needs f/u US in 8-10 weeks 3-endometrial biopsy  ENDOMETRIAL BIOPSY     The indications for endometrial biopsy were reviewed.   Risks of the biopsy including cramping, bleeding, infection, uterine perforation, inadequate specimen and need for additional procedures  were discussed. The patient states she understands and agrees to undergo procedure today. Consent was signed. Time out was performed. Urine HCG was negative. A sterile speculum was placed in the patient's vagina and the cervix was prepped with Betadine. A single-toothed tenaculum was placed on the anterior lip of the cervix to stabilize it. The 3 mm  pipelle was introduced into the endometrial cavity without difficulty to a depth of 8 cm, and a moderate amount of tissue was obtained and sent to pathology. The instruments were removed from the patient's vagina. Minimal bleeding from the cervix was noted. The patient tolerated the procedure well. Routine post-procedure instructions were given to the patient. The patient will follow up to review the results and for further management.

## 2015-07-22 ENCOUNTER — Other Ambulatory Visit: Payer: Self-pay | Admitting: Family Medicine

## 2015-07-24 ENCOUNTER — Telehealth: Payer: Self-pay | Admitting: *Deleted

## 2015-07-24 ENCOUNTER — Encounter: Payer: Self-pay | Admitting: Obstetrics & Gynecology

## 2015-07-24 ENCOUNTER — Ambulatory Visit (INDEPENDENT_AMBULATORY_CARE_PROVIDER_SITE_OTHER): Payer: BLUE CROSS/BLUE SHIELD | Admitting: Obstetrics & Gynecology

## 2015-07-24 VITALS — BP 111/69 | HR 63 | Resp 15 | Ht 69.0 in | Wt 245.0 lb

## 2015-07-24 DIAGNOSIS — N8501 Benign endometrial hyperplasia: Secondary | ICD-10-CM

## 2015-07-24 NOTE — Progress Notes (Signed)
Patient is a 34 year old female who presents for results follow-up and plan. Patient has PCO and chronic anovulation. Patient had endometrial biopsy which showed simple hyperplasia without atypia. Patient ultrasound showed a thickened endometrium with a right exophytic ovarian cyst. It is unclear whether the cyst is on the ovary US or associated with the fallopian tube. She has been off Provera for almost 14 days. She is only spotting. She does let the Provera is making her absolutely uncontrollable in her mood. She is fighting with her father who she takes care of. She absolutely cannot continue any type of hormonal contraception. She is not interested in childbearing. She is aware that she could have children if she underwent fertility treatments but her and her husband both do not want any children. Present during this conversation and concurs. Patient would like a hysterectomy. She also would like the right ovary removed if the cyst is still present. Given her habitus and narrow vagina, patient would do best with total laparoscopic hysterectomy. Dr. Clovia Cuff with this practice specializes in this surgery and Margaret Lawson will be referred to her for surgical management.  I encouraged Margaret Lawson to finish her Provera and have one bleed. Patient hopefully will be able to. She is unsure if she will be able to continue the pills as they are making her mentally unstable. Patient also has a follow-up appointment with hematology for elevated white count and elevated hemoglobin that is chronic and long-standing. I don't believe this will inhibit her from having surgery.  25 minutes spent face-to-face with patient and her husband. Greater than 50% was counseling.

## 2015-07-24 NOTE — Telephone Encounter (Signed)
LM on voicemail to ensure that pt knows that once she stops the Provera she will have or should have a fairly heavy cycle.  This withdrawal bleed will ensure that the Provera has worked.

## 2015-08-01 ENCOUNTER — Ambulatory Visit: Payer: BLUE CROSS/BLUE SHIELD | Admitting: Obstetrics & Gynecology

## 2015-08-01 ENCOUNTER — Encounter: Payer: Self-pay | Admitting: Obstetrics & Gynecology

## 2015-08-01 VITALS — BP 112/69 | HR 84 | Resp 16 | Ht 69.0 in | Wt 245.0 lb

## 2015-08-01 DIAGNOSIS — N8501 Benign endometrial hyperplasia: Secondary | ICD-10-CM

## 2015-08-01 NOTE — Progress Notes (Signed)
   Subjective:    Patient ID: Margaret Lawson, female    DOB: 19-May-1981, 35 y.o.   MRN: [id number]  HPI  Margaret Lawson is here to discuss a possible TLH. I have discussed the risks of surgery as well as the alternative of Mirena, but she is certain that she would like a hysterectomy.  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 Maylene RoesTamala Lawson. She will see her in the near future.

## 2015-08-05 ENCOUNTER — Ambulatory Visit: Payer: Self-pay | Admitting: Hematology & Oncology

## 2015-08-05 ENCOUNTER — Institutional Professional Consult (permissible substitution): Payer: Self-pay | Admitting: Obstetrics & Gynecology

## 2015-08-05 ENCOUNTER — Ambulatory Visit: Payer: BLUE CROSS/BLUE SHIELD

## 2015-08-05 ENCOUNTER — Other Ambulatory Visit: Payer: Self-pay

## 2015-08-12 ENCOUNTER — Encounter: Payer: Self-pay | Admitting: Obstetrics & Gynecology

## 2015-08-12 ENCOUNTER — Ambulatory Visit (INDEPENDENT_AMBULATORY_CARE_PROVIDER_SITE_OTHER): Payer: BLUE CROSS/BLUE SHIELD | Admitting: Obstetrics & Gynecology

## 2015-08-12 VITALS — Ht 69.0 in | Wt 239.1 lb

## 2015-08-12 DIAGNOSIS — N945 Secondary dysmenorrhea: Secondary | ICD-10-CM | POA: Diagnosis not present

## 2015-08-12 DIAGNOSIS — N8500 Endometrial hyperplasia, unspecified: Secondary | ICD-10-CM | POA: Diagnosis not present

## 2015-08-12 DIAGNOSIS — N83201 Unspecified ovarian cyst, right side: Secondary | ICD-10-CM

## 2015-08-12 DIAGNOSIS — N8501 Benign endometrial hyperplasia: Secondary | ICD-10-CM

## 2015-08-12 DIAGNOSIS — N939 Abnormal uterine and vaginal bleeding, unspecified: Secondary | ICD-10-CM

## 2015-08-12 NOTE — Patient Instructions (Signed)
Laparoscopically Assisted Vaginal Hysterectomy A laparoscopically assisted vaginal hysterectomy (LAVH) is a surgical procedure to remove the uterus and cervix, and sometimes the ovaries and fallopian tubes. During an LAVH, some of the surgical removal is done through the vagina, and the rest is done through a few small surgical cuts (incisions) in the abdomen.  This procedure is usually considered in women when a vaginal hysterectomy is not an option. Your health care provider will discuss the risks and benefits of the different surgical techniques at your appointment. Generally, recovery time is faster and there are fewer complications after laparoscopic procedures than after open incisional procedures. LET YOUR HEALTH CARE PROVIDER KNOW ABOUT:   Any allergies you have.  All medicines you are taking, including vitamins, herbs, eye drops, creams, and over-the-counter medicines.  Previous problems you or members of your family have had with the use of anesthetics.  Any blood disorders you have.  Previous surgeries you have had.  Medical conditions you have. RISKS AND COMPLICATIONS Generally, this is a safe procedure. However, as with any procedure, complications can occur. Possible complications include:  Allergies to medicines.  Difficulty breathing.  Bleeding.  Infection.  Damage to other structures near your uterus and cervix. BEFORE THE PROCEDURE  Ask your health care provider about changing or stopping your regular medicines.  Take certain medicines, such as a colon-emptying preparation, as directed.  Do not eat or drink anything for at least 8 hours before your surgery.  Stop smoking if you smoke. Stopping will improve your health after surgery.  Arrange for a ride home after surgery and for help at home during recovery. PROCEDURE   An IV tube will be put into one of your veins in order to give you fluids and medicines.  You will receive medicines to relax you and  medicines that make you sleep (general anesthetic).  You may have a flexible tube (catheter) put into your bladder to drain urine.  You may have a tube put through your nose or mouth that goes into your stomach (nasogastric tube). The nasogastric tube removes digestive fluids and prevents you from feeling nauseated and from vomiting.  Tight-fitting (compression) stockings will be placed on your legs to promote circulation.  Three to four small incisions will be made in your abdomen. An incision also will be made in your vagina. Probes and tools will be inserted into the small incisions. The uterus and cervix are removed (and possibly your ovaries and fallopian tubes) through your vagina as well as through the small incisions that were made in the abdomen.  Your vagina is then sewn back to normal. AFTER THE PROCEDURE  You may have a liquid diet temporarily. You will most likely return to, and tolerate, your usual diet the day after surgery.  You will be passing urine through a catheter. It will be removed the day after surgery.  Your temperature, breathing rate, heart rate, blood pressure, and oxygen level will be monitored regularly.  You will still wear compression stockings on your legs until you are able to move around.  You will use a special device or do breathing exercises to keep your lungs clear.  You will be encouraged to walk as soon as possible.   This information is not intended to replace advice given to you by your health care provider. Make sure you discuss any questions you have with your health care provider.   Document Released: 07/02/2011 Document Revised: 08/03/2014 Document Reviewed: 01/26/2013 Elsevier Interactive Patient Education 2016 Elsevier   Inc.  

## 2015-08-12 NOTE — Progress Notes (Signed)
Patient ID: Margaret Lawson, female   DOB: [birth year], 35 y.o.   MRN: [id number] History:  35 y.o. G0P0000 here today for eval for robot assisted laparoscopic hysterectomy with bilateral salpingectomy.  Pt with h/o PCOS and irreg cycles for many years.  She was eval by Drs Gala Romney and Hulan Fray and a hysterectomy was recommended but, pt would like to have a RATH and was referred to me for eval.  She denies new complaints since her last visit.  She reports that she is married and is sexually active but, this is limited by the irreg bleeding.  She c/o of pain intermittently when the bleeding is occuring.  She is not currently working but, she cares for her father.  She denies lifting and does not feel that the restrictions of the post op course will be a problem.   The following portions of the patient's history were reviewed and updated as appropriate: allergies, current medications, past family history, past medical history, past social history, past surgical history and problem list. Past Medical History  Diagnosis Date  . Asthma, chronic 12/27/2013  . Essential hypertension, benign 12/27/2013  . Fibromyalgia 12/27/2013  . Leukocytosis 12/27/2013  . Perforation of left tympanic membrane 12/27/2013  . Bipolar 2 disorder ([place])   . PCOS (polycystic ovarian syndrome)    Past Surgical History  Procedure Laterality Date  . Cholecystectomy    . Wisdom tooth extraction     Current Outpatient Prescriptions on File Prior to Visit  Medication Sig Dispense Refill  . albuterol (PROVENTIL HFA;VENTOLIN HFA) 108 (90 BASE) MCG/ACT inhaler Inhale 2 puffs into the lungs every 4 (four) hours as needed for wheezing. 18 g 0  . albuterol (PROVENTIL) (2.5 MG/3ML) 0.083% nebulizer solution Take 3 mLs (2.5 mg total) by nebulization every 4 (four) hours as needed for wheezing. 75 mL 0  . atenolol-chlorthalidone (TENORETIC) 100-25 MG per tablet TAKE 1 TABLET BY MOUTH DAILY 30 tablet 5  . cetirizine (ZYRTEC) 10 MG tablet Take 10 mg  by mouth daily.    . famotidine (PEPCID) 20 MG tablet Take 20 mg by mouth 2 (two) times daily.    . Fish Oil OIL by Does not apply route.    . gabapentin (NEURONTIN) 600 MG tablet TAKE 1 TABLET BY MOUTH THREE TIMES DAILY 270 tablet 0  . ipratropium-albuterol (DUONEB) 0.5-2.5 (3) MG/3ML SOLN Take 3 mLs by nebulization every 4 (four) hours as needed (wheezing/cough/sob). 360 mL 1  . lurasidone (LATUDA) 20 MG TABS tablet Take 1 tablet (20 mg total) by mouth daily. 30 tablet 2  . Multiple Vitamin (MULTIVITAMIN) capsule Take 1 capsule by mouth daily.    Marland Kitchen nystatin (MYCOSTATIN) 100000 UNIT/ML suspension Take 5 mLs (500,000 Units total) by mouth 4 (four) times daily. Swish in mouth and swallow. Use until asymptomatic from thrush for two days. 120 mL 1  . oxyCODONE-acetaminophen (ROXICET) 5-325 MG tablet Take 1 tablet by mouth every 6 (six) hours as needed for severe pain. 20 tablet 0  . pantoprazole (PROTONIX) 40 MG tablet TAKE 1 TABLET BY MOUTH DAILY 90 tablet 0  . promethazine (PHENERGAN) 25 MG tablet Take 1 tablet (25 mg total) by mouth every 6 (six) hours as needed for nausea or vomiting. 30 tablet 0  . tiZANidine (ZANAFLEX) 4 MG tablet TAKE 1/2 TO 1 TABLET BY MOUTH EVERY 6 HOURS AS NEEDED FOR MUSCLE SPASMS 120 tablet 5  . topiramate (TOPAMAX) 50 MG tablet TAKE 1 TABLET BY MOUTH ONCE OR TWICE DAILY TO HELP PREVENT  MIGRAINES 60 tablet 0  . valACYclovir (VALTREX) 500 MG tablet Take 1 tablet (500 mg total) by mouth 2 (two) times daily. 10 tablet 0  . medroxyPROGESTERone (PROVERA) 10 MG tablet Take 1 tab PO for 2 weeks (Patient not taking: Reported on 08/12/2015) 14 tablet 0  . [DISCONTINUED] OLANZapine (ZYPREXA) 5 MG tablet Take 5 mg by mouth at bedtime.     No current facility-administered medications on file prior to visit.   Social History   Social History  . Marital Status: Married    Spouse Name: N/A  . Number of Children: N/A  . Years of Education: N/A   Occupational History  . unemployed     Social History Main Topics  . Smoking status: Current Every Day Smoker -- 1.00 packs/day for 20 years    Types: Cigarettes  . Smokeless tobacco: Never Used     Comment: uses 6mg  vapes  . Alcohol Use: No  . Drug Use: No  . Sexual Activity:    Partners: Male    Birth Control/ Protection: None   Other Topics Concern  . Not on file   Social History Narrative    Review of Systems:  Pertinent items are noted in HPI.  Objective:  Physical Exam Height 5\' 9"  (1.753 m), weight 239 lb 1.9 oz (108.464 kg), last menstrual period 07/28/2015. Gen: NAD Lungs: CTA CV: RRR Abd: Soft, nontender and nondistended; obese.  Well healed incisions from prior cholecystectomy   Pelvic: Normal appearing external genitalia; Small uterus, no other palpable masses, no uterine or adnexal tenderness.  Pt has very narrow introitus with limited descensus.   CBC    Component Value Date/Time   WBC 14.6* 06/25/2015 1447   RBC 5.39* 06/25/2015 1447   HGB 16.4* 06/25/2015 1447   HCT 47.7* 06/25/2015 1447   PLT 245 06/25/2015 1447   MCV 88.5 06/25/2015 1447   MCH 30.4 06/25/2015 1447   MCHC 34.4 06/25/2015 1447   RDW 14.4 06/25/2015 1447   LYMPHSABS 4.4* 06/25/2015 1447   MONOABS 0.9 06/25/2015 1447   EOSABS 0.1 06/25/2015 1447   BASOSABS 0.0 06/25/2015 1447    07/10/2015 CLINICAL DATA: History of menometrorrhagia, previous history of amenorrhea, history of polycystic ovarian syndrome, obesity. Onset of last normal menstrual period was May 28, 2015.  EXAM: TRANSABDOMINAL AND TRANSVAGINAL ULTRASOUND OF PELVIS  TECHNIQUE: Both transabdominal and transvaginal ultrasound examinations of the pelvis were performed. Transabdominal technique was performed for global imaging of the pelvis including uterus, ovaries, adnexal regions, and pelvic cul-de-sac. It was necessary to proceed with endovaginal exam following the transabdominal exam to visualize the endometrium and ovaries.  COMPARISON:  None in PACs  FINDINGS: Uterus  Measurements: 6.8 x 3.5 x 4.6 cm. No fibroids or other mass visualized.  Endometrium  Thickness: 14 mm. Within the endometrium there are multiple tiny anechoic cystic appearing regions.  Right ovary  Measurements: 3.1 x 2.4 x 2.8 cm. There is an exophytic right ovarian cyst measuring 1.6 cm. There are no findings suspicious for polycystic ovarian syndrome.  Left ovary  Measurements: 3.1 x 2.4 x 2.4 cm. Normal appearance/no adnexal mass.  Other findings  No free fluid.  IMPRESSION: 1. The uterus is normal in contour with no evidence of fibrosis. The endometrial stripe is mildly thickened. The patient is over to her menstrual period. There are tiny cystic areas present within the endometrium. There is no evidence of a gestational sac. Correlation with patient's beta HCG is needed. If bleeding remains unresponsive to hormonal  or medical therapy, sonohysterogram should be considered for focal lesion work-up. (Ref: Radiological Reasoning: Algorithmic Workup of Abnormal Vaginal Bleeding with Endovaginal Sonography and Sonohysterography. AJR 2008; [id number]) 2. Exophytic 1.6 cm right ovarian cyst. The echotexture of both the right and left ovaries is otherwise unremarkable.   Labs and Imaging 07/15/2015 Diagnosis Endometrium, biopsy - SIMPLE HYPERPLASIA WITHOUT ATYPIA IN A BACKGROUND OF DISORDERED PROLIFERATIVE PATTERN ENDOMETRIUM.  Assessment & Plan:  Pt with AUB thought due to h/o anovulatory cycles and simple hyperplasia.  Pt has declined medical management and requests definitive treatement.  I have reviewed the post op course including no bending, no lifting, no stooping. And have reviewed with her that NO INTERCOURSE IS ALLOWED for 8 WEEKS min and possible longer pending physical exam.      Patient desires surgical management with robot assisted laparoscopic hysterectomy with bilateral salpingectomy with possible right ov  cystectomy.  The risks of surgery were discussed in detail with the patient including but not limited to: bleeding which may require transfusion or reoperation; infection which may require prolonged hospitalization or re-hospitalization and antibiotic therapy; injury to bowel, bladder, ureters and major vessels or other surrounding organs; need for additional procedures including laparotomy; thromboembolic phenomenon, incisional problems and other postoperative or anesthesia complications.  Patient was told that the likelihood that her condition and symptoms will be treated effectively with this surgical management was very high; the postoperative expectations were also discussed in detail. The patient also understands the alternative treatment options which were discussed in full. All questions were answered.  She was told that she will be contacted by our surgical scheduler regarding the time and date of her surgery; routine preoperative instructions of having nothing to eat or drink after midnight on the day prior to surgery and also coming to the hospital 1 1/2 hours prior to her time of surgery were also emphasized.  She was told she may be called for a preoperative appointment about a week prior to surgery and will be given further preoperative instructions at that visit. Printed patient education handouts about the procedure were given to the patient to review at home.  Joni Colegrove L. Harraway-Smith, M.D., Cherlynn June

## 2015-08-14 ENCOUNTER — Encounter (HOSPITAL_COMMUNITY): Payer: Self-pay | Admitting: *Deleted

## 2015-08-21 ENCOUNTER — Ambulatory Visit (HOSPITAL_BASED_OUTPATIENT_CLINIC_OR_DEPARTMENT_OTHER): Payer: BLUE CROSS/BLUE SHIELD | Admitting: Hematology & Oncology

## 2015-08-21 ENCOUNTER — Telehealth: Payer: Self-pay | Admitting: *Deleted

## 2015-08-21 ENCOUNTER — Encounter: Payer: Self-pay | Admitting: Hematology & Oncology

## 2015-08-21 ENCOUNTER — Ambulatory Visit: Payer: BLUE CROSS/BLUE SHIELD

## 2015-08-21 ENCOUNTER — Other Ambulatory Visit (HOSPITAL_BASED_OUTPATIENT_CLINIC_OR_DEPARTMENT_OTHER): Payer: BLUE CROSS/BLUE SHIELD

## 2015-08-21 VITALS — BP 131/92 | HR 69 | Temp 98.2°F | Resp 16 | Ht 69.0 in | Wt 235.0 lb

## 2015-08-21 DIAGNOSIS — D72829 Elevated white blood cell count, unspecified: Secondary | ICD-10-CM

## 2015-08-21 DIAGNOSIS — L819 Disorder of pigmentation, unspecified: Secondary | ICD-10-CM

## 2015-08-21 DIAGNOSIS — E876 Hypokalemia: Secondary | ICD-10-CM

## 2015-08-21 DIAGNOSIS — D751 Secondary polycythemia: Secondary | ICD-10-CM | POA: Diagnosis not present

## 2015-08-21 LAB — CBC WITH DIFFERENTIAL (CANCER CENTER ONLY)
BASO#: 0.1 10*3/uL (ref 0.0–0.2)
BASO%: 0.4 % (ref 0.0–2.0)
EOS ABS: 0.1 10*3/uL (ref 0.0–0.5)
EOS%: 1.1 % (ref 0.0–7.0)
HCT: 45.7 % (ref 34.8–46.6)
HGB: 16.2 g/dL — ABNORMAL HIGH (ref 11.6–15.9)
LYMPH#: 4.7 10*3/uL — ABNORMAL HIGH (ref 0.9–3.3)
LYMPH%: 35.6 % (ref 14.0–48.0)
MCH: 30.5 pg (ref 26.0–34.0)
MCHC: 35.4 g/dL (ref 32.0–36.0)
MCV: 86 fL (ref 81–101)
MONO#: 0.9 10*3/uL (ref 0.1–0.9)
MONO%: 7.2 % (ref 0.0–13.0)
NEUT#: 7.3 10*3/uL — ABNORMAL HIGH (ref 1.5–6.5)
NEUT%: 55.7 % (ref 39.6–80.0)
PLATELETS: 241 10*3/uL (ref 145–400)
RBC: 5.32 10*6/uL (ref 3.70–5.32)
RDW: 13.7 % (ref 11.1–15.7)
WBC: 13.1 10*3/uL — ABNORMAL HIGH (ref 3.9–10.0)

## 2015-08-21 LAB — COMPREHENSIVE METABOLIC PANEL
ALT: 23 U/L (ref 0–55)
ANION GAP: 12 meq/L — AB (ref 3–11)
AST: 20 U/L (ref 5–34)
Albumin: 3.7 g/dL (ref 3.5–5.0)
Alkaline Phosphatase: 62 U/L (ref 40–150)
BILIRUBIN TOTAL: 0.61 mg/dL (ref 0.20–1.20)
BUN: 10.7 mg/dL (ref 7.0–26.0)
CALCIUM: 9.9 mg/dL (ref 8.4–10.4)
CHLORIDE: 98 meq/L (ref 98–109)
CO2: 28 meq/L (ref 22–29)
CREATININE: 0.8 mg/dL (ref 0.6–1.1)
Glucose: 104 mg/dl (ref 70–140)
Potassium: 2.6 mEq/L — CL (ref 3.5–5.1)
Sodium: 138 mEq/L (ref 136–145)
TOTAL PROTEIN: 7.6 g/dL (ref 6.4–8.3)

## 2015-08-21 LAB — CHCC SATELLITE - SMEAR

## 2015-08-21 MED ORDER — POTASSIUM CHLORIDE CRYS ER 20 MEQ PO TBCR: 20.0000 meq | EXTENDED_RELEASE_TABLET | Freq: Every day | ORAL | Status: DC

## 2015-08-21 NOTE — Progress Notes (Signed)
Referral MD  Reason for Referral: Leukocytosis and erythrocytosis.   Chief Complaint  Patient presents with  . OTHER    New Patient  : I want to know why my blood is abnormal.  HPI: Ms. Mayon is a very nice 35 year old white female. She has a history of polycystic ovary syndrome. . She is to have a hysterectomy in February.  She has multiple other issues. She has fibromyalgia. She has bipolar disorder. She has asthma.  She has had some issues with erythrocytosis and leukocytosis. Go back through her chart, these are not really new.  She said that she saw a hematologist about 6 years ago. He said that if she did not stop smoking, and she will get leukemia.  She does smoke. She started smoking back when she was 35 years old. She smokes less than a pack per day.  I really don't think she drinks alcohol.  She has had past surgeries for gallbladder.   Going through her labs. Back in June 2015, her white cell count was 13.4. Hemoglobin 14.5 . platelet count 272,000. MCV was 88.  In August 2020 6T, her white cell count was 14.1. Her hemoglobin 15.9. Her hematocrit 46.6. And platelet count 263,000. Her MCV was 89. In November, her white cell count was 14.6. Hemoglobin was 16.4 and hematocrit 47.7. And platelet count 247,000.  She's had Chronic headaches. She's had no rashes. She's had no cough. She's had no nausea or vomiting.  Both she and her husband have lost quite a bit of weight. This was an intentional weight loss.  Her gynecologist Conly referred her over to the [facility] for an evaluation of the erythrocytosis and leukocytosis.  She has borderline diabetes by her hemoglobin A1c.  She has not had any foreign travel. She's had no leg swelling. She's had no burning on the hands or feet. She's had no rashes. She's had no unusual pain outside of that associated with fibromyalgia.  Overall, her performance status is ECOG 1.    Past Medical History   Diagnosis Date  . Asthma, chronic 12/27/2013  . Essential hypertension, benign 12/27/2013  . Fibromyalgia 12/27/2013  . Leukocytosis 12/27/2013  . Perforation of left tympanic membrane 12/27/2013  . Bipolar 2 disorder ([place])   . PCOS (polycystic ovarian syndrome)   :  Past Surgical History  Procedure Laterality Date  . Cholecystectomy    . Wisdom tooth extraction    :   Current outpatient prescriptions:  .  albuterol (PROVENTIL HFA;VENTOLIN HFA) 108 (90 BASE) MCG/ACT inhaler, Inhale 2 puffs into the lungs every 4 (four) hours as needed for wheezing., Disp: 18 g, Rfl: 0 .  albuterol (PROVENTIL) (2.5 MG/3ML) 0.083% nebulizer solution, Take 3 mLs (2.5 mg total) by nebulization every 4 (four) hours as needed for wheezing., Disp: 75 mL, Rfl: 0 .  atenolol-chlorthalidone (TENORETIC) 100-25 MG per tablet, TAKE 1 TABLET BY MOUTH DAILY, Disp: 30 tablet, Rfl: 5 .  cetirizine (ZYRTEC) 10 MG tablet, Take 10 mg by mouth daily., Disp: , Rfl:  .  Fish Oil OIL, by Does not apply route., Disp: , Rfl:  .  gabapentin (NEURONTIN) 600 MG tablet, TAKE 1 TABLET BY MOUTH THREE TIMES DAILY, Disp: 270 tablet, Rfl: 0 .  ipratropium-albuterol (DUONEB) 0.5-2.5 (3) MG/3ML SOLN, Take 3 mLs by nebulization every 4 (four) hours as needed (wheezing/cough/sob)., Disp: 360 mL, Rfl: 1 .  Multiple Vitamin (MULTIVITAMIN) capsule, Take 1 capsule by mouth daily., Disp: , Rfl:  .  nystatin (  MYCOSTATIN) 100000 UNIT/ML suspension, Take 5 mLs (500,000 Units total) by mouth 4 (four) times daily. Swish in mouth and swallow. Use until asymptomatic from thrush for two days., Disp: 120 mL, Rfl: 1 .  pantoprazole (PROTONIX) 40 MG tablet, TAKE 1 TABLET BY MOUTH DAILY, Disp: 90 tablet, Rfl: 0 .  promethazine (PHENERGAN) 25 MG tablet, Take 1 tablet (25 mg total) by mouth every 6 (six) hours as needed for nausea or vomiting., Disp: 30 tablet, Rfl: 0 .  tiZANidine (ZANAFLEX) 4 MG tablet, TAKE 1/2 TO 1 TABLET BY MOUTH EVERY 6 HOURS AS NEEDED FOR MUSCLE  SPASMS, Disp: 120 tablet, Rfl: 5 .  topiramate (TOPAMAX) 50 MG tablet, TAKE 1 TABLET BY MOUTH ONCE OR TWICE DAILY TO HELP PREVENT MIGRAINES, Disp: 60 tablet, Rfl: 0 .  valACYclovir (VALTREX) 500 MG tablet, Take 1 tablet (500 mg total) by mouth 2 (two) times daily., Disp: 10 tablet, Rfl: 0 .  lurasidone (LATUDA) 20 MG TABS tablet, Take 1 tablet (20 mg total) by mouth daily., Disp: 30 tablet, Rfl: 2 .  potassium chloride SA (K-DUR,KLOR-CON) 20 MEQ tablet, Take 1 tablet (20 mEq total) by mouth daily., Disp: 30 tablet, Rfl: 0 .  [DISCONTINUED] OLANZapine (ZYPREXA) 5 MG tablet, Take 5 mg by mouth at bedtime., Disp: , Rfl: :  :  Allergies  Allergen Reactions  . Celecoxib Nausea And Vomiting  . Erythromycin Nausea And Vomiting  . Latex   . Lyrica [Pregabalin]     Induced lactation  . Morphine And Related     Decrease bp  . Penicillins   :  Family History  Problem Relation Age of Onset  . Depression Father   . Depression Sister   . Heart disease Father   . Mitral valve prolapse Father   . Aneurysm Mother     brain  . Diabetes Father   . Hypertension Father   . Breast cancer Paternal Grandfather   . Breast cancer Paternal Aunt   :  Social History   Social History  . Marital Status: Married    Spouse Name: N/A  . Number of Children: N/A  . Years of Education: N/A   Occupational History  . unemployed    Social History Main Topics  . Smoking status: Current Every Day Smoker -- 1.00 packs/day for 20 years    Types: Cigarettes  . Smokeless tobacco: Never Used     Comment: uses 64m vapes  . Alcohol Use: No  . Drug Use: No  . Sexual Activity:    Partners: Male    Birth Control/ Protection: None   Other Topics Concern  . Not on file   Social History Narrative  :  Pertinent items are noted in HPI.  Exam: _0 @  well-developed and well-nourished white female in no obvious distress. Vital signs are the temperature of 98.2. Pulse 69. Blood pressure 131/92. Weight is  235 pounds. Head and neck exam shows no ocular or oral lesions. She has no scleral icterus. She has no facial plethora. She has no adenopathy in the neck. Thyroid is nonpalpable. Lungs are clear bilaterally. Cardiac exam regular rate and rhythm with no murmurs rubs or bruits. Abdomen is soft. She is mildly obese. She has no fluid wave. She has no palpable spleen. She has no palpable liver. She has no guarding or rebound tenderness. Back exam shows no tenderness over the spine ribs or hips. Extremities shows no clubbing, cyanosis or edema. She has good range of motion of her joints. No  joint swelling or erythema is noted. She has good strength. Skin exam shows no rashes, ecchymoses or petechia. Neurological exam shows no focal neurological deficits.    Recent Labs  08/21/15 1347  WBC 13.1*  HGB 16.2*  HCT 45.7  PLT 241    Recent Labs  08/21/15 1348  NA 138  K 2.6*  CO2 28  GLUCOSE 104  BUN 10.7  CREATININE 0.8  CALCIUM 9.9    Blood smear review: Normochromic and normocytic population of red blood cells. There is no nucleated red blood cells. She has no target cells. There is no teardrop cells. She has no inclusion bodies. There is no real low formation. White cells are minimally increased in number. She has good maturation of her white blood cells. She has no hypersegmented polys. There is no immature myeloid or lymphoid forms. There is no atypical lymphocytes. Platelets are adequate in number and size.  Pathology: As above.     Assessment and Plan:  Ms. Akard is a 35 year old white female. She has mild erythrocytosis and mild leukocytosis.  I think that the erythrocytosis probably is from the polycystic ovaries. Female who have this typically have elevated testosterone level. Testosterone is androgenic and clearly is needed in erythroid production. It will be interesting to see what happens when she has her hysterectomy.  The leukocytosis probably is from her smoking. I don't  see any medications that she is on that might cause the leukocytosis. She does have multiple chronic inflammatory conditions of this might lead to some leukocytosis.  I do think that we have to watch out for polycythemia vera. 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 I did send off a JAK2 assay. I did this would be very important. I also sent off an erythropoietin level, iron studies, and LDH.  I talked to her for about an hour. I told her that she ultimately may need to have a bone marrow biopsy done if our studies are equivocal. If the JAK2 assay is positive, then this makes the diagnosis for [place]. If this is the case, then I would put her on a phlebotomy program.  Hopefully, we will not need a bone marrow biopsy on her but something tells me that this might be something that we will have to consider.  I told her that she can have her hysterectomy. I don't see any problems with her having this from her hematologic issues. The erythrocytosis and leukocytosis are minimal so I don't think they would cause any issues with respect to healing.  We likely will probably see her in about a month or so. I want to make sure that she is able to get here with a little discomfort after her surgery.

## 2015-08-21 NOTE — Telephone Encounter (Signed)
Critical Value Postassium 2.6 Dr Marin Olp notified. Order for oral potassium prescription received. Spoke to patient and she is aware of new prescription, and to follow up with her PCP regarding low potassium levels.

## 2015-08-22 LAB — TESTOSTERONE: TESTOSTERONE: 44 ng/dL (ref 8–48)

## 2015-08-22 LAB — VITAMIN B12: VITAMIN B 12: 451 pg/mL (ref 211–946)

## 2015-08-22 LAB — IRON AND TIBC
%SAT: 15 % — ABNORMAL LOW (ref 21–57)
Iron: 56 ug/dL (ref 41–142)
TIBC: 366 ug/dL (ref 236–444)
UIBC: 310 ug/dL (ref 120–384)

## 2015-08-22 LAB — RETICULOCYTES: RETICULOCYTE COUNT: 1.7 % (ref 0.6–2.6)

## 2015-08-22 LAB — FERRITIN: Ferritin: 65 ng/ml (ref 9–269)

## 2015-08-22 LAB — ERYTHROPOIETIN: ERYTHROPOIETIN: 29.7 m[IU]/mL — AB (ref 2.6–18.5)

## 2015-08-22 LAB — LACTATE DEHYDROGENASE: LDH: 204 U/L (ref 125–245)

## 2015-08-22 NOTE — Addendum Note (Signed)
Addended by: Volanda Napoleon on: 08/22/2015 08:58 AM   Modules accepted: Orders

## 2015-08-23 LAB — HEMOGLOBINOPATHY EVALUATION
HEMOGLOBIN A2 QUANTITATION: 2.3 % (ref 0.7–3.1)
HGB A: 97.7 % (ref 94.0–98.0)
HGB C: 0 %
HGB S: 0 %
Hemoglobin F Quantitation: 0 % (ref 0.0–2.0)

## 2015-09-02 NOTE — Patient Instructions (Addendum)
Your procedure is scheduled on:  Tuesday, Feb. 14, 2017  Enter through the [employer] [facility] at:  9:15 A.M.  Pick up the phone at the desk and dial [phone].  Call this number if you have problems the morning of surgery: [phone].  Remember: Do NOT eat food or drink after:  Midnight Monday, Feb. 13, 2017  Take these medicines the morning of surgery with a SIP OF WATER:  Atenolol, Gabapentin, Protonix, Potassium, Topamax, Valtrex  *Bring asthma inhaler day of surgery.  *Stop taking fish oil at this time  Do NOT wear jewelry (body piercing), metal hair clips/bobby pins, make-up, or nail polish. Do NOT wear lotions, powders, or perfumes.  You may wear deoderant. Do NOT shave for 48 hours prior to surgery. Do NOT bring valuables to the hospital. Contacts, dentures, or bridgework may not be worn into surgery.  Leave suitcase in car.  After surgery it may be brought to your room.  For patients admitted to the hospital, checkout time is 11:00 AM the day of discharge.

## 2015-09-03 ENCOUNTER — Encounter (HOSPITAL_COMMUNITY)
Admission: RE | Admit: 2015-09-03 | Discharge: 2015-09-03 | Disposition: A | Discharge status: Home / Self Care | Payer: BLUE CROSS/BLUE SHIELD | Source: Ambulatory Visit | Attending: Obstetrics & Gynecology | Admitting: Obstetrics & Gynecology

## 2015-09-03 ENCOUNTER — Encounter (HOSPITAL_COMMUNITY): Payer: Self-pay

## 2015-09-03 DIAGNOSIS — Z01812 Encounter for preprocedural laboratory examination: Secondary | ICD-10-CM | POA: Insufficient documentation

## 2015-09-03 HISTORY — DX: Headache: R51

## 2015-09-03 HISTORY — DX: Gastro-esophageal reflux disease without esophagitis: K21.9

## 2015-09-03 HISTORY — DX: Iron deficiency: E61.1

## 2015-09-03 HISTORY — DX: Anxiety disorder, unspecified: F41.9

## 2015-09-03 HISTORY — DX: Weakness: R53.1

## 2015-09-03 HISTORY — DX: Headache, unspecified: R51.9

## 2015-09-03 HISTORY — DX: Disorder of teeth and supporting structures, unspecified: K08.9

## 2015-09-03 HISTORY — DX: Pain in right leg: M79.604

## 2015-09-03 HISTORY — DX: Other somatoform disorders: F45.8

## 2015-09-03 HISTORY — DX: Restless legs syndrome: G25.81

## 2015-09-03 HISTORY — DX: Other specified symptoms and signs involving the digestive system and abdomen: R19.8

## 2015-09-03 LAB — CBC
HCT: 44.4 % (ref 36.0–46.0)
Hemoglobin: 15.6 g/dL — ABNORMAL HIGH (ref 12.0–15.0)
MCH: 31 pg (ref 26.0–34.0)
MCHC: 35.1 g/dL (ref 30.0–36.0)
MCV: 88.1 fL (ref 78.0–100.0)
Platelets: 230 10*3/uL (ref 150–400)
RBC: 5.04 MIL/uL (ref 3.87–5.11)
RDW: 14.1 % (ref 11.5–15.5)
WBC: 12.5 10*3/uL — AB (ref 4.0–10.5)

## 2015-09-03 LAB — BASIC METABOLIC PANEL
Anion gap: 13 (ref 5–15)
BUN: 14 mg/dL (ref 6–20)
CO2: 25 mmol/L (ref 22–32)
CREATININE: 0.86 mg/dL (ref 0.44–1.00)
Calcium: 9.3 mg/dL (ref 8.9–10.3)
Chloride: 98 mmol/L — ABNORMAL LOW (ref 101–111)
GFR calc Af Amer: >60 mL/min (ref >60)
Glucose, Bld: 99 mg/dL (ref 65–99)
POTASSIUM: 3 mmol/L — AB (ref 3.5–5.1)
SODIUM: 136 mmol/L (ref 135–145)

## 2015-09-03 LAB — ABO/RH: ABO/RH(D): A NEG

## 2015-09-03 LAB — TYPE AND SCREEN
ABO/RH(D): A NEG
ANTIBODY SCREEN: NEGATIVE

## 2015-09-03 NOTE — Pre-Procedure Instructions (Signed)
Dr. Ihor Dow and Dr. Jillyn Hidden made aware of potassium level 3.0.  Dr. Jillyn Hidden ordered patient to double her current potassium dose daily until day of surgery.  I have spoke with [name] via telephone and patient verbalized understanding.

## 2015-09-03 NOTE — Pre-Procedure Instructions (Signed)
Social work has been paged.  Patient was very emotional and "heartbroken" about her hysterectomy and losing her ability to have children.  Patient would benefit from counseling or support group to help her adjust to her current health situation.

## 2015-09-04 ENCOUNTER — Telehealth: Payer: Self-pay | Admitting: General Practice

## 2015-09-04 NOTE — Telephone Encounter (Signed)
Patient called and left message on nurse line stating she is a patient of Dr Vladimir Creeks and is having a robotic hysterectomy on Tuesday. Patient is wanting to know if there is bowel prep involved and pre op told her to call us.

## 2015-09-05 NOTE — Telephone Encounter (Signed)
Spoke with Dr Ihor Dow about whether patient should have bowel prep prior to her surgery.  Per Dr Sheldon Silvan pt does not need a bowel prep.  Pt notified.

## 2015-09-06 ENCOUNTER — Ambulatory Visit (INDEPENDENT_AMBULATORY_CARE_PROVIDER_SITE_OTHER): Payer: BLUE CROSS/BLUE SHIELD | Admitting: Physician Assistant

## 2015-09-06 ENCOUNTER — Encounter: Payer: Self-pay | Admitting: Physician Assistant

## 2015-09-06 VITALS — BP 138/78 | HR 71 | Ht 68.0 in | Wt 235.0 lb

## 2015-09-06 DIAGNOSIS — R059 Cough, unspecified: Secondary | ICD-10-CM

## 2015-09-06 DIAGNOSIS — J0100 Acute maxillary sinusitis, unspecified: Secondary | ICD-10-CM | POA: Diagnosis not present

## 2015-09-06 DIAGNOSIS — R05 Cough: Secondary | ICD-10-CM

## 2015-09-06 DIAGNOSIS — J4541 Moderate persistent asthma with (acute) exacerbation: Secondary | ICD-10-CM

## 2015-09-06 MED ORDER — HYDROCOD POLST-CPM POLST ER 10-8 MG/5ML PO SUER: 5.0000 mL | Freq: Two times a day (BID) | ORAL | Status: DC | PRN

## 2015-09-06 MED ORDER — AZITHROMYCIN 250 MG PO TABS: ORAL_TABLET | ORAL | Status: DC

## 2015-09-06 MED ORDER — IPRATROPIUM-ALBUTEROL 0.5-2.5 (3) MG/3ML IN SOLN
3.0000 mL | Freq: Once | RESPIRATORY_TRACT | Status: AC
  Administered 2015-09-06: 3 mL via RESPIRATORY_TRACT

## 2015-09-06 MED ORDER — ALBUTEROL SULFATE HFA 108 (90 BASE) MCG/ACT IN AERS: 2.0000 | INHALATION_SPRAY | RESPIRATORY_TRACT | Status: DC | PRN

## 2015-09-06 MED ORDER — IPRATROPIUM-ALBUTEROL 0.5-2.5 (3) MG/3ML IN SOLN: 3.0000 mL | RESPIRATORY_TRACT | Status: DC | PRN

## 2015-09-06 MED ORDER — METHYLPREDNISOLONE SODIUM SUCC 125 MG IJ SOLR
125.0000 mg | Freq: Once | INTRAMUSCULAR | Status: AC
  Administered 2015-09-06: 125 mg via INTRAMUSCULAR

## 2015-09-06 NOTE — Progress Notes (Addendum)
   Subjective:    Patient ID: Margaret Lawson, female    DOB: 1981-05-23, 35 y.o.   MRN: [id number]  HPI  Patient is a 35 year old female presenting with purulent rhinorrhea, paroxysmal productive cough, malaise and wheezing for the past four days. Patient has a past medical history relevant for asthma. Patient states that her nieces and nephews were sick two weeks ago. Patient denies recent travel. Patient denies  abdominal pain, nausea, vomiting, dysuria, hematuria, and diarrhea. Patient smokes one pack of cigarettes per day. Patient states that she has asthma symptoms less than one day per week ordinarily. Patient has been afebrile. She has ran out of all inhalers. She is due to have hysterectomy on feb 14th and needs to be well.   Review of Systems Please see HPI    Objective:   Physical Exam  Constitutional: She is oriented to person, place, and time. She appears well-developed and well-nourished.  HENT:  Head: Normocephalic and atraumatic.  Patient's posterior pharynx is erythematous with cobblestoning.   Eyes: Conjunctivae and EOM are normal. Pupils are equal, round, and reactive to light.  Neck: Normal range of motion. No thyromegaly present.  Cardiovascular: Normal rate, regular rhythm and normal heart sounds.   Pulmonary/Chest: Effort normal. No respiratory distress. She has no rales.  Expiratory wheezing bilateral base.  After nebulizer Both inspiratory and expiratory wheezing heard.   No rhonchi  Abdominal: Soft. Bowel sounds are normal. She exhibits no distension. There is no tenderness. There is no rebound and no guarding.  Lymphadenopathy:    She has no cervical adenopathy.  Neurological: She is alert and oriented to person, place, and time.  Skin: Skin is warm and dry.  Psychiatric: She has a normal mood and affect. Her behavior is normal. Thought content normal.      Assessment & Plan:   1. Sinusitis: Patient presents with purulent rhinorrhea and paroxysmal  cough. Patient was prescribed azithromycin for acute sinusitis.   2. Asthma Exacerbation: Patient presents with cough, wheezing and occasional SOB, likely secondary to asthma exacerbation. Patient was given nebulizer in office with duoneb. Patient's wheezing showed improvement. Patient's rescue albuterol and nebulizers were refilled. Patient was given solumedrol 125mg  IM half life is 18-36hours so should not interfere with surgery.   3. Smoking Cessation: Patient currently smokes 1 pack of cigarettes per day. Tobacco cessation was advocated.

## 2015-09-09 MED ORDER — GENTAMICIN SULFATE 40 MG/ML IJ SOLN
INTRAVENOUS | Status: AC
  Administered 2015-09-10: 11:00:00 via INTRAVENOUS
  Filled 2015-09-09: qty 10.25

## 2015-09-09 NOTE — Pre-Procedure Instructions (Signed)
Margaret Lawson made aware of patient's concern and will come see patient day of surgery.

## 2015-09-10 ENCOUNTER — Encounter (HOSPITAL_COMMUNITY): Payer: Self-pay | Admitting: Emergency Medicine

## 2015-09-10 ENCOUNTER — Ambulatory Visit (HOSPITAL_COMMUNITY): Payer: BLUE CROSS/BLUE SHIELD | Admitting: Anesthesiology

## 2015-09-10 ENCOUNTER — Ambulatory Visit (HOSPITAL_COMMUNITY)
Admission: RE | Admit: 2015-09-10 | Discharge: 2015-09-10 | Disposition: A | Discharge status: Home / Self Care | Payer: BLUE CROSS/BLUE SHIELD | Source: Ambulatory Visit | Attending: Obstetrics & Gynecology | Admitting: Obstetrics & Gynecology

## 2015-09-10 ENCOUNTER — Encounter (HOSPITAL_COMMUNITY)
Admission: RE | Disposition: A | Discharge status: Home / Self Care | Payer: Self-pay | Source: Ambulatory Visit | Attending: Obstetrics & Gynecology

## 2015-09-10 DIAGNOSIS — J45909 Unspecified asthma, uncomplicated: Secondary | ICD-10-CM | POA: Diagnosis not present

## 2015-09-10 DIAGNOSIS — I10 Essential (primary) hypertension: Secondary | ICD-10-CM | POA: Diagnosis not present

## 2015-09-10 DIAGNOSIS — F17210 Nicotine dependence, cigarettes, uncomplicated: Secondary | ICD-10-CM | POA: Insufficient documentation

## 2015-09-10 DIAGNOSIS — Z23 Encounter for immunization: Secondary | ICD-10-CM | POA: Insufficient documentation

## 2015-09-10 DIAGNOSIS — N83201 Unspecified ovarian cyst, right side: Secondary | ICD-10-CM

## 2015-09-10 DIAGNOSIS — N939 Abnormal uterine and vaginal bleeding, unspecified: Secondary | ICD-10-CM | POA: Diagnosis not present

## 2015-09-10 DIAGNOSIS — K219 Gastro-esophageal reflux disease without esophagitis: Secondary | ICD-10-CM | POA: Insufficient documentation

## 2015-09-10 DIAGNOSIS — M797 Fibromyalgia: Secondary | ICD-10-CM | POA: Diagnosis present

## 2015-09-10 DIAGNOSIS — Z9889 Other specified postprocedural states: Secondary | ICD-10-CM

## 2015-09-10 DIAGNOSIS — N8501 Benign endometrial hyperplasia: Secondary | ICD-10-CM | POA: Diagnosis not present

## 2015-09-10 DIAGNOSIS — F411 Generalized anxiety disorder: Secondary | ICD-10-CM

## 2015-09-10 HISTORY — PX: ROBOTIC ASSISTED TOTAL HYSTERECTOMY WITH BILATERAL SALPINGO OOPHERECTOMY: SHX6086

## 2015-09-10 LAB — PREGNANCY, URINE: Preg Test, Ur: NEGATIVE

## 2015-09-10 SURGERY — ROBOTIC ASSISTED TOTAL HYSTERECTOMY WITH BILATERAL SALPINGO OOPHORECTOMY
Anesthesia: General | Site: Abdomen | Laterality: Bilateral

## 2015-09-10 MED ORDER — KETOROLAC TROMETHAMINE 0.5 % OP SOLN
1.0000 [drp] | Freq: Three times a day (TID) | OPHTHALMIC | Status: DC | PRN
  Administered 2015-09-10: 1 [drp] via OPHTHALMIC
  Filled 2015-09-10: qty 3

## 2015-09-10 MED ORDER — PROPOFOL 10 MG/ML IV BOLUS
INTRAVENOUS | Status: AC
  Filled 2015-09-10: qty 20

## 2015-09-10 MED ORDER — ROCURONIUM BROMIDE 100 MG/10ML IV SOLN
INTRAVENOUS | Status: AC
  Filled 2015-09-10: qty 1

## 2015-09-10 MED ORDER — MEPERIDINE HCL 25 MG/ML IJ SOLN: 6.2500 mg | INTRAMUSCULAR | Status: DC | PRN

## 2015-09-10 MED ORDER — PANTOPRAZOLE SODIUM 40 MG PO TBEC: 40.0000 mg | DELAYED_RELEASE_TABLET | Freq: Every day | ORAL | Status: DC

## 2015-09-10 MED ORDER — MIDAZOLAM HCL 2 MG/2ML IJ SOLN
INTRAMUSCULAR | Status: AC
  Administered 2015-09-10: 0.5 mg via INTRAVENOUS
  Filled 2015-09-10: qty 2

## 2015-09-10 MED ORDER — SIMETHICONE 80 MG PO CHEW: 80.0000 mg | CHEWABLE_TABLET | Freq: Four times a day (QID) | ORAL | Status: DC | PRN

## 2015-09-10 MED ORDER — ONDANSETRON HCL 4 MG/2ML IJ SOLN: 4.0000 mg | Freq: Four times a day (QID) | INTRAMUSCULAR | Status: DC | PRN

## 2015-09-10 MED ORDER — DEXAMETHASONE SODIUM PHOSPHATE 4 MG/ML IJ SOLN
INTRAMUSCULAR | Status: DC | PRN
  Administered 2015-09-10: 4 mg via INTRAVENOUS

## 2015-09-10 MED ORDER — BISACODYL 10 MG RE SUPP: 10.0000 mg | Freq: Every day | RECTAL | Status: DC | PRN

## 2015-09-10 MED ORDER — TOPIRAMATE 25 MG PO TABS
50.0000 mg | ORAL_TABLET | Freq: Every day | ORAL | Status: DC
  Filled 2015-09-10: qty 2

## 2015-09-10 MED ORDER — MIDAZOLAM HCL 2 MG/2ML IJ SOLN
0.5000 mg | INTRAMUSCULAR | Status: DC | PRN
  Administered 2015-09-10: 0.5 mg via INTRAVENOUS

## 2015-09-10 MED ORDER — ONDANSETRON HCL 4 MG/2ML IJ SOLN
INTRAMUSCULAR | Status: AC
  Filled 2015-09-10: qty 2

## 2015-09-10 MED ORDER — OXYCODONE-ACETAMINOPHEN 5-325 MG PO TABS
1.0000 | ORAL_TABLET | ORAL | Status: DC | PRN
  Administered 2015-09-10: 1 via ORAL
  Filled 2015-09-10: qty 1

## 2015-09-10 MED ORDER — MIDAZOLAM HCL 2 MG/2ML IJ SOLN: 1.0000 mg | Freq: Once | INTRAMUSCULAR | Status: DC

## 2015-09-10 MED ORDER — CHLORTHALIDONE 25 MG PO TABS
25.0000 mg | ORAL_TABLET | Freq: Every day | ORAL | Status: DC
  Filled 2015-09-10: qty 1

## 2015-09-10 MED ORDER — FENTANYL CITRATE (PF) 100 MCG/2ML IJ SOLN
INTRAMUSCULAR | Status: AC
  Administered 2015-09-10: 50 ug via INTRAVENOUS
  Filled 2015-09-10: qty 2

## 2015-09-10 MED ORDER — SCOPOLAMINE 1 MG/3DAYS TD PT72
MEDICATED_PATCH | TRANSDERMAL | Status: AC
  Administered 2015-09-10: 1.5 mg via TRANSDERMAL
  Filled 2015-09-10: qty 1

## 2015-09-10 MED ORDER — METHYLENE BLUE 1 % INJ SOLN
INTRAMUSCULAR | Status: AC
  Filled 2015-09-10: qty 1

## 2015-09-10 MED ORDER — GABAPENTIN 300 MG PO CAPS
600.0000 mg | ORAL_CAPSULE | Freq: Once | ORAL | Status: AC
  Administered 2015-09-10: 600 mg via ORAL
  Filled 2015-09-10: qty 2

## 2015-09-10 MED ORDER — NEOSTIGMINE METHYLSULFATE 10 MG/10ML IV SOLN
INTRAVENOUS | Status: DC | PRN
  Administered 2015-09-10 (×2): 2 mg via INTRAVENOUS

## 2015-09-10 MED ORDER — LACTATED RINGERS IV SOLN
INTRAVENOUS | Status: DC
  Administered 2015-09-10 (×2): via INTRAVENOUS

## 2015-09-10 MED ORDER — METHYLENE BLUE 1 % INJ SOLN
INTRAMUSCULAR | Status: AC
  Filled 2015-09-10: qty 10

## 2015-09-10 MED ORDER — ARTIFICIAL TEARS OP OINT
TOPICAL_OINTMENT | OPHTHALMIC | Status: AC
  Filled 2015-09-10: qty 3.5

## 2015-09-10 MED ORDER — MIDAZOLAM HCL 2 MG/2ML IJ SOLN
INTRAMUSCULAR | Status: AC
  Filled 2015-09-10: qty 2

## 2015-09-10 MED ORDER — KETOROLAC TROMETHAMINE 30 MG/ML IJ SOLN: 30.0000 mg | Freq: Once | INTRAMUSCULAR | Status: DC

## 2015-09-10 MED ORDER — ROCURONIUM BROMIDE 100 MG/10ML IV SOLN
INTRAVENOUS | Status: DC | PRN
  Administered 2015-09-10: 10 mg via INTRAVENOUS
  Administered 2015-09-10: 50 mg via INTRAVENOUS

## 2015-09-10 MED ORDER — MENTHOL 3 MG MT LOZG: 1.0000 | LOZENGE | OROMUCOSAL | Status: DC | PRN

## 2015-09-10 MED ORDER — BUPIVACAINE HCL 0.5 % IJ SOLN
INTRAMUSCULAR | Status: DC | PRN
  Administered 2015-09-10: 30 mL

## 2015-09-10 MED ORDER — LACTATED RINGERS IR SOLN
Status: DC | PRN
  Administered 2015-09-10: 3000 mL

## 2015-09-10 MED ORDER — ONDANSETRON HCL 4 MG PO TABS: 4.0000 mg | ORAL_TABLET | Freq: Four times a day (QID) | ORAL | Status: DC | PRN

## 2015-09-10 MED ORDER — MIDAZOLAM HCL 5 MG/5ML IJ SOLN
INTRAMUSCULAR | Status: DC | PRN
  Administered 2015-09-10: 2 mg via INTRAVENOUS

## 2015-09-10 MED ORDER — GABAPENTIN 600 MG PO TABS
600.0000 mg | ORAL_TABLET | Freq: Once | ORAL | Status: DC
  Filled 2015-09-10: qty 1

## 2015-09-10 MED ORDER — STERILE WATER FOR IRRIGATION IR SOLN
Status: DC | PRN
  Administered 2015-09-10: 1000 mL

## 2015-09-10 MED ORDER — LIDOCAINE HCL (CARDIAC) 20 MG/ML IV SOLN
INTRAVENOUS | Status: DC | PRN
  Administered 2015-09-10: 70 mg via INTRAVENOUS

## 2015-09-10 MED ORDER — METHYLENE BLUE 0.5 % INJ SOLN
INTRAVENOUS | Status: DC | PRN
  Administered 2015-09-10: 5 mL via INTRAVENOUS

## 2015-09-10 MED ORDER — GLYCOPYRROLATE 0.2 MG/ML IJ SOLN
INTRAMUSCULAR | Status: AC
  Filled 2015-09-10: qty 4

## 2015-09-10 MED ORDER — DEXAMETHASONE SODIUM PHOSPHATE 4 MG/ML IJ SOLN
INTRAMUSCULAR | Status: AC
  Filled 2015-09-10: qty 1

## 2015-09-10 MED ORDER — IPRATROPIUM-ALBUTEROL 0.5-2.5 (3) MG/3ML IN SOLN
3.0000 mL | RESPIRATORY_TRACT | Status: DC | PRN
  Filled 2015-09-10: qty 3

## 2015-09-10 MED ORDER — BSS IO SOLN
15.0000 mL | Freq: Once | INTRAOCULAR | Status: AC
  Administered 2015-09-10: 15 mL
  Filled 2015-09-10: qty 15

## 2015-09-10 MED ORDER — ONDANSETRON HCL 4 MG/2ML IJ SOLN
INTRAMUSCULAR | Status: DC | PRN
  Administered 2015-09-10: 4 mg via INTRAVENOUS

## 2015-09-10 MED ORDER — DEXAMETHASONE SODIUM PHOSPHATE 4 MG/ML IJ SOLN: 8.0000 mg | Freq: Once | INTRAMUSCULAR | Status: DC | PRN

## 2015-09-10 MED ORDER — LIDOCAINE HCL (CARDIAC) 20 MG/ML IV SOLN
INTRAVENOUS | Status: AC
  Filled 2015-09-10: qty 5

## 2015-09-10 MED ORDER — GLYCOPYRROLATE 0.2 MG/ML IJ SOLN
INTRAMUSCULAR | Status: DC | PRN
  Administered 2015-09-10: 0.4 mg via INTRAVENOUS
  Administered 2015-09-10: 0.3 mg via INTRAVENOUS
  Administered 2015-09-10: 0.4 mg via INTRAVENOUS

## 2015-09-10 MED ORDER — PROPOFOL 500 MG/50ML IV EMUL: INTRAVENOUS | Status: DC | PRN

## 2015-09-10 MED ORDER — FENTANYL CITRATE (PF) 100 MCG/2ML IJ SOLN
25.0000 ug | INTRAMUSCULAR | Status: DC | PRN
  Administered 2015-09-10 (×2): 50 ug via INTRAVENOUS

## 2015-09-10 MED ORDER — DEXTROSE-NACL 5-0.9 % IV SOLN: INTRAVENOUS | Status: DC

## 2015-09-10 MED ORDER — SCOPOLAMINE 1 MG/3DAYS TD PT72
1.0000 | MEDICATED_PATCH | Freq: Once | TRANSDERMAL | Status: DC
  Administered 2015-09-10: 1.5 mg via TRANSDERMAL

## 2015-09-10 MED ORDER — ATENOLOL 100 MG PO TABS
100.0000 mg | ORAL_TABLET | Freq: Every day | ORAL | Status: DC
  Filled 2015-09-10: qty 1

## 2015-09-10 MED ORDER — OXYCODONE-ACETAMINOPHEN 5-325 MG PO TABS: 1.0000 | ORAL_TABLET | Freq: Four times a day (QID) | ORAL | Status: DC | PRN

## 2015-09-10 MED ORDER — FENTANYL CITRATE (PF) 100 MCG/2ML IJ SOLN
INTRAMUSCULAR | Status: DC | PRN
  Administered 2015-09-10: 100 ug via INTRAVENOUS
  Administered 2015-09-10 (×3): 50 ug via INTRAVENOUS

## 2015-09-10 MED ORDER — TIZANIDINE HCL 4 MG PO TABS
4.0000 mg | ORAL_TABLET | Freq: Four times a day (QID) | ORAL | Status: DC | PRN
  Filled 2015-09-10: qty 1

## 2015-09-10 MED ORDER — POLYETHYLENE GLYCOL 3350 17 G PO PACK: 17.0000 g | PACK | Freq: Every day | ORAL | Status: DC | PRN

## 2015-09-10 MED ORDER — PNEUMOCOCCAL VAC POLYVALENT 25 MCG/0.5ML IJ INJ
0.5000 mL | INJECTION | INTRAMUSCULAR | Status: AC
  Administered 2015-09-10: 0.5 mL via INTRAMUSCULAR
  Filled 2015-09-10: qty 0.5

## 2015-09-10 MED ORDER — ZOLPIDEM TARTRATE 5 MG PO TABS: 5.0000 mg | ORAL_TABLET | Freq: Every evening | ORAL | Status: DC | PRN

## 2015-09-10 MED ORDER — ACETAMINOPHEN 10 MG/ML IV SOLN
1000.0000 mg | Freq: Once | INTRAVENOUS | Status: AC
  Administered 2015-09-10: 1000 mg via INTRAVENOUS
  Filled 2015-09-10: qty 100

## 2015-09-10 MED ORDER — BUPIVACAINE HCL (PF) 0.5 % IJ SOLN
INTRAMUSCULAR | Status: AC
  Filled 2015-09-10: qty 30

## 2015-09-10 MED ORDER — DOCUSATE SODIUM 100 MG PO CAPS: 100.0000 mg | ORAL_CAPSULE | Freq: Two times a day (BID) | ORAL | Status: DC

## 2015-09-10 MED ORDER — POLYMYXIN B-TRIMETHOPRIM 10000-0.1 UNIT/ML-% OP SOLN
1.0000 [drp] | Freq: Three times a day (TID) | OPHTHALMIC | Status: DC
Start: 2015-09-10 — End: 2015-09-10
  Administered 2015-09-10: 1 [drp] via OPHTHALMIC
  Filled 2015-09-10: qty 10

## 2015-09-10 MED ORDER — LACTATED RINGERS IV SOLN: INTRAVENOUS | Status: DC

## 2015-09-10 MED ORDER — FENTANYL CITRATE (PF) 250 MCG/5ML IJ SOLN
INTRAMUSCULAR | Status: AC
  Filled 2015-09-10: qty 5

## 2015-09-10 MED ORDER — ALBUTEROL SULFATE (2.5 MG/3ML) 0.083% IN NEBU: 3.0000 mL | INHALATION_SOLUTION | RESPIRATORY_TRACT | Status: DC | PRN

## 2015-09-10 MED ORDER — ATENOLOL-CHLORTHALIDONE 100-25 MG PO TABS: 1.0000 | ORAL_TABLET | Freq: Every day | ORAL | Status: DC

## 2015-09-10 SURGICAL SUPPLY — 63 items
APPLICATOR ARISTA FLEXITIP XL (MISCELLANEOUS) ×3 IMPLANT
APPLIER CLIP 5 13 M/L LIGAMAX5 (MISCELLANEOUS)
BARRIER ADHS 3X4 INTERCEED (GAUZE/BANDAGES/DRESSINGS) IMPLANT
BENZOIN TINCTURE PRP APPL 2/3 (GAUZE/BANDAGES/DRESSINGS) IMPLANT
CATH FOLEY 3WAY  5CC 16FR (CATHETERS) ×2
CATH FOLEY 3WAY 5CC 16FR (CATHETERS) ×1 IMPLANT
CLIP APPLIE 5 13 M/L LIGAMAX5 (MISCELLANEOUS) IMPLANT
CLOSURE WOUND 1/2 X4 (GAUZE/BANDAGES/DRESSINGS)
CLOTH BEACON ORANGE TIMEOUT ST (SAFETY) ×3 IMPLANT
CONT PATH 16OZ SNAP LID 3702 (MISCELLANEOUS) ×3 IMPLANT
COVER BACK TABLE 60X90IN (DRAPES) ×6 IMPLANT
COVER TIP SHEARS 8 DVNC (MISCELLANEOUS) ×1 IMPLANT
COVER TIP SHEARS 8MM DA VINCI (MISCELLANEOUS) ×2
DECANTER SPIKE VIAL GLASS SM (MISCELLANEOUS) ×3 IMPLANT
DEFOGGER SCOPE WARMER CLEARIFY (MISCELLANEOUS) ×3 IMPLANT
DEVICE TROCAR PUNCTURE CLOSURE (ENDOMECHANICALS) IMPLANT
DRSG COVADERM PLUS 2X2 (GAUZE/BANDAGES/DRESSINGS) ×12 IMPLANT
DRSG OPSITE POSTOP 3X4 (GAUZE/BANDAGES/DRESSINGS) ×3 IMPLANT
DURAPREP 26ML APPLICATOR (WOUND CARE) ×3 IMPLANT
ELECT REM PT RETURN 9FT ADLT (ELECTROSURGICAL) ×3
ELECTRODE REM PT RTRN 9FT ADLT (ELECTROSURGICAL) ×1 IMPLANT
GAUZE VASELINE 3X9 (GAUZE/BANDAGES/DRESSINGS) IMPLANT
GLOVE BIO SURGEON STRL SZ7 (GLOVE) ×6 IMPLANT
GLOVE BIOGEL PI IND STRL 7.0 (GLOVE) ×5 IMPLANT
GLOVE BIOGEL PI INDICATOR 7.0 (GLOVE) ×10
GOWN STRL REUS W/TWL XL LVL3 (GOWN DISPOSABLE) ×3 IMPLANT
HEMOSTAT ARISTA ABSORB 3G PWDR (MISCELLANEOUS) ×3 IMPLANT
KIT ACCESSORY DA VINCI DISP (KITS) ×2
KIT ACCESSORY DVNC DISP (KITS) ×1 IMPLANT
LEGGING LITHOTOMY PAIR STRL (DRAPES) ×3 IMPLANT
LIQUID BAND (GAUZE/BANDAGES/DRESSINGS) ×3 IMPLANT
NEEDLE HYPO 22GX1.5 SAFETY (NEEDLE) ×3 IMPLANT
NEEDLE INSUFFLATION 120MM (ENDOMECHANICALS) ×3 IMPLANT
OCCLUDER COLPOPNEUMO (BALLOONS) ×3 IMPLANT
PACK ROBOT WH (CUSTOM PROCEDURE TRAY) ×3 IMPLANT
PACK ROBOTIC GOWN (GOWN DISPOSABLE) ×3 IMPLANT
PAD PREP 24X48 CUFFED NSTRL (MISCELLANEOUS) ×9 IMPLANT
PAD TRENDELENBURG POSITION (MISCELLANEOUS) ×3 IMPLANT
SET CYSTO W/LG BORE CLAMP LF (SET/KITS/TRAYS/PACK) ×3 IMPLANT
SET IRRIG TUBING LAPAROSCOPIC (IRRIGATION / IRRIGATOR) ×3 IMPLANT
SET TRI-LUMEN FLTR TB AIRSEAL (TUBING) ×3 IMPLANT
STRIP CLOSURE SKIN 1/2X4 (GAUZE/BANDAGES/DRESSINGS) IMPLANT
SURGIFLO W/THROMBIN 8M KIT (HEMOSTASIS) IMPLANT
SUT VIC AB 0 CT1 27 (SUTURE) ×4
SUT VIC AB 0 CT1 27XBRD ANBCTR (SUTURE) ×2 IMPLANT
SUT VIC AB 0 CT1 27XBRD ANTBC (SUTURE) IMPLANT
SUT VICRYL 0 UR6 27IN ABS (SUTURE) ×6 IMPLANT
SUT VICRYL 4-0 PS2 18IN ABS (SUTURE) ×6 IMPLANT
SUT VLOC 180 0 9IN  GS21 (SUTURE) ×2
SUT VLOC 180 0 9IN GS21 (SUTURE) ×1 IMPLANT
SYR CONTROL 10ML LL (SYRINGE) ×3 IMPLANT
SYSTEM CONVERTIBLE TROCAR (TROCAR) IMPLANT
TIP RUMI ORANGE 6.7MMX12CM (TIP) IMPLANT
TIP UTERINE 5.1X6CM LAV DISP (MISCELLANEOUS) IMPLANT
TIP UTERINE 6.7X10CM GRN DISP (MISCELLANEOUS) IMPLANT
TIP UTERINE 6.7X6CM WHT DISP (MISCELLANEOUS) IMPLANT
TIP UTERINE 6.7X8CM BLUE DISP (MISCELLANEOUS) ×3 IMPLANT
TOWEL OR 17X24 6PK STRL BLUE (TOWEL DISPOSABLE) ×9 IMPLANT
TROCAR DILATING TIP 12MM 150MM (ENDOMECHANICALS) ×3 IMPLANT
TROCAR DISP BLADELESS 8 DVNC (TROCAR) ×1 IMPLANT
TROCAR DISP BLADELESS 8MM (TROCAR) ×2
TROCAR PORT AIRSEAL 5X120 (TROCAR) ×3 IMPLANT
WATER STERILE IRR 1000ML POUR (IV SOLUTION) ×9 IMPLANT

## 2015-09-10 NOTE — Anesthesia Preprocedure Evaluation (Signed)
Anesthesia Evaluation  Patient identified by MRN, date of birth, ID band Patient awake    Reviewed: Allergy & Precautions, H&P , NPO status , Patient's Chart, lab work & pertinent test results  Airway Mallampati: I  TM Distance: >3 FB Neck ROM: full    Dental no notable dental hx. (+) Teeth Intact   Pulmonary Current Smoker,  Will use inhaler prior to OR.   + rhonchi        Cardiovascular hypertension, negative cardio ROS Normal cardiovascular exam     Neuro/Psych    GI/Hepatic Neg liver ROS, GERD  Medicated and Controlled,  Endo/Other  negative endocrine ROS  Renal/GU negative Renal ROS     Musculoskeletal   Abdominal (+) + obese,   Peds  Hematology negative hematology ROS (+)   Anesthesia Other Findings   Reproductive/Obstetrics negative OB ROS                             Anesthesia Physical Anesthesia Plan  ASA: III  Anesthesia Plan: General   Post-op Pain Management:    Induction: Intravenous  Airway Management Planned: Oral ETT  Additional Equipment:   Intra-op Plan:   Post-operative Plan: Extubation in OR  Informed Consent: I have reviewed the patients History and Physical, chart, labs and discussed the procedure including the risks, benefits and alternatives for the proposed anesthesia with the patient or authorized representative who has indicated his/her understanding and acceptance.   Dental advisory given  Plan Discussed with: CRNA and Surgeon  Anesthesia Plan Comments:         Anesthesia Quick Evaluation

## 2015-09-10 NOTE — Anesthesia Postprocedure Evaluation (Signed)
Anesthesia Post Note  Patient: Miroslava Drumwright  Procedure(s) Performed: Procedure(s) (LRB): ROBOTIC ASSISTED TOTAL HYSTERECTOMY WITH BILATERAL SALPINGECTOMY (Bilateral)  Patient location during evaluation: [facility] Anesthesia Type: General Level of consciousness: awake, awake and alert and oriented Pain management: pain level controlled Vital Signs Assessment: post-procedure vital signs reviewed and stable Respiratory status: spontaneous breathing, nonlabored ventilation and respiratory function stable Cardiovascular status: stable Postop Assessment: no signs of nausea or vomiting, adequate PO intake, no headache and no backache Anesthetic complications: yes Anesthetic complication details: Seen by Dr. Glennon Mac for left corneal abrasion and injury of corneaComments: Seen by Dr. Glennon Mac for left corneal abrasion.     Last Vitals:  Filed Vitals:   09/10/15 1510 09/10/15 1602  BP: 104/66 104/54  Pulse: 53 50  Temp: 36.8 C 36.5 C  Resp: 16 18    Last Pain:  Filed Vitals:   09/10/15 1644  PainSc: 6                  Jaquez Farrington

## 2015-09-10 NOTE — Addendum Note (Signed)
Addendum  created 09/10/15 1702 by Lyndle Herrlich, MD   Modules edited: Orders, PRL Based Order Sets

## 2015-09-10 NOTE — Anesthesia Procedure Notes (Signed)
Procedure Name: Intubation Date/Time: 09/10/2015 10:50 AM Performed by: Vernice Jefferson Pre-anesthesia Checklist: Patient identified, Emergency Drugs available, Suction available, Patient being monitored and Timeout performed Patient Re-evaluated:Patient Re-evaluated prior to inductionOxygen Delivery Method: Circle system utilized Preoxygenation: Pre-oxygenation with 100% oxygen Intubation Type: IV induction Laryngoscope Size: Mac and 3 Grade View: Grade I Tube type: Oral Tube size: 7.0 mm Number of attempts: 1 Airway Equipment and Method: Stylet Placement Confirmation: ETT inserted through vocal cords under direct vision,  positive ETCO2 and breath sounds checked- equal and bilateral Secured at: 21 cm Tube secured with: Tape Dental Injury: Teeth and Oropharynx as per pre-operative assessment

## 2015-09-10 NOTE — Op Note (Signed)
09/10/2015  1:25 PM  PATIENT:  Margaret Lawson  35 y.o. female  PRE-OPERATIVE DIAGNOSIS:  Right ovarian cyst with abnormal uterine bleeding and simple hyperplasia  POST-OPERATIVE DIAGNOSIS:  Right ovarian cyst with abnormal uterine bleeding and simple hyperplasia  PROCEDURE:  Procedure(s): ROBOTIC ASSISTED TOTAL HYSTERECTOMY WITH BILATERAL SALPINGECTOMY (Bilateral) and cystoscopy  SURGEON:  Surgeon(s) and Role:    * Lavonia Drafts, MD - Primary    * Donnamae Jude, MD - Assisting  ANESTHESIA:   general  EBL:  Total I/O In: 1000 [I.V.:1000] Out: 700 [Urine:600; Blood:100]  BLOOD ADMINISTERED:none  DRAINS: none   LOCAL MEDICATIONS USED:  MARCAINE     SPECIMEN:  Source of Specimen:  left fallopian tube with mass attached; Right fallopian tube with cyst and uterus and cervix  DISPOSITION OF SPECIMEN:  PATHOLOGY  COUNTS:  YES  TOURNIQUET:  * No tourniquets in log *  DICTATION: .Note written in EPIC  PLAN OF CARE: discharge to home after porlonged observation  PATIENT DISPOSITION:  PACU - hemodynamically stable.   Delay start of Pharmacological VTE agent (>24hrs) due to surgical blood loss or risk of bleeding: yes  Complications: none immediate  The risks, benefits, and alternatives of surgery were explained, understood, and accepted. Consents were signed. All questions were answered. She was taken to the operating room and general anesthesia was applied without complication. She was placed in the dorsal lithotomy position and her abdomen and vagina were prepped and draped after she had been carefully positioned on the table. A bimanual exam revealed an 9 week size uterus that was mobile. Her adnexa were not enlarged. The cervix was measured and the uterus was sounded to 9 cm. A Rumi uterine manipulator was placed without difficulty. A Foley catheter was placed and it drained clear throughout the case. Gloves were changed and attention was turned to the abdomen. A  38mm incision was made 10 mm above the umbilicus and a Veress needle was placed intraperitoneally. CO2 was used to insufflate the abdomen to approximately 4 L. After good pneumoperitoneum was established, a 12 mm trocar was placed in the umbilicus.  Laparoscopy confirmed correct placement. She was placed in Trendelenburg position and ports were placed in appropriate positions on her abdomen to allow maximum exposure during the robotic case. Specifically there was an 85mm assistant port placed in the left upper quadrant under direct laproscopic visualization. Two 8 mm ports were placed 8cm lateral to the midline port.  These were all placed under direct laparoscopic visualization. The robot was docked and I proceeded with a robotic portion of the case.  The pelvis was inspected and the uterus was found to be normal in size but, the left fallopian tube had a solid mass attached and the right fallopian tube had a cyst attached.  Both ovaries were normal in appearance. The remainder of her pelvis appeared normal. The ureters and the infundibulopelvic ligaments were identified. I excised the fallopian tubes bilaterally. The round ligament on each side was cauterized and cut. The PK/gyrus instrument was used for this portion. The round ligaments were identified, cauterized and ligated, a bladder flap was created anteriorly. The uterine vessels were identified and cauterized and then cut.The bladder was pushed out of the operative site and an anterior colpotomy was made. The colpotomy incision was extended circumferentially, following the blue outline of the Rumi manipulator. All pedicles were hemostatic.  The uterus was removed from the vagina with the fallopian tube segments. The vaginal cuff was closed with v-lock  suture.  Excellent hemostasis was noted throughout. The pelvis was irrigated. The intraabdominal pressure was lowered assess hemostasis. After determining excellent hemostasis, the robot was undocked. At this  point I performed cystoscopy. The cystoscopy revealed blue ejection of methylene blue from both ureters.  The midline fascial incision was closed with 0 vicryl.  The skin from all of the other ports was closed with 3-0 vicryl. 30cc of 0.5% Marcaine was injected into the port sites.  Dermabond glue was placed over the port sites. The patient was then extubated and taken to recovery in stable condition.   Sponge, lap and needle counts were correct x 2.  [name], M.D., [facility]

## 2015-09-10 NOTE — Addendum Note (Signed)
Addendum  created 09/10/15 1731 by Elenore Paddy, CRNA   Modules edited: Clinical Notes   Clinical Notes:  File: [id number]

## 2015-09-10 NOTE — H&P (Signed)
Preoperative History and Physical  Margaret Lawson is a 35 y.o. G0P0000 here for surgical management of AUB due to simple hyperplasia.   Proposed surgery: Robot assisted total laparoscopic hysterectomy with bilateral salpingectomy  Past Medical History  Diagnosis Date  . Asthma, chronic 12/27/2013  . Essential hypertension, benign 12/27/2013  . Fibromyalgia 12/27/2013  . Leukocytosis 12/27/2013  . Perforation of left tympanic membrane 12/27/2013  . PCOS (polycystic ovarian syndrome)   . Bipolar 2 disorder ([place])   . Anxiety     severe  . GERD (gastroesophageal reflux disease)   . Headache     migraines, over stimulation  . Right leg pain   . Restless leg syndrome   . Low iron   . Teeth clenching   . Teeth grinding   . Poor dentition     right side upper and lower  . Weakness     when exposed to hot or cold temperatures   Past Surgical History  Procedure Laterality Date  . Cholecystectomy    . Wisdom tooth extraction     OB History    Gravida Para Term Preterm AB TAB SAB Ectopic Multiple Living   0 0 0 0 0 0 0 0 0 0      Patient denies any cervical dysplasia or STIs. Prescriptions prior to admission  Medication Sig Dispense Refill Last Dose  . acetaminophen (TYLENOL) 500 MG tablet Take 500 mg by mouth every 6 (six) hours as needed for mild pain or moderate pain.   09/09/2015 at Unknown time  . albuterol (PROVENTIL HFA;VENTOLIN HFA) 108 (90 Base) MCG/ACT inhaler Inhale 2 puffs into the lungs every 4 (four) hours as needed for wheezing. 18 g 4 09/10/2015 at Unknown time  . albuterol (PROVENTIL) (2.5 MG/3ML) 0.083% nebulizer solution Take 3 mLs (2.5 mg total) by nebulization every 4 (four) hours as needed for wheezing. 75 mL 0 09/09/2015 at Unknown time  . atenolol-chlorthalidone (TENORETIC) 100-25 MG per tablet TAKE 1 TABLET BY MOUTH DAILY 30 tablet 5 09/10/2015 at Unknown time  . azithromycin (ZITHROMAX) 250 MG tablet Take 2 tablets now and then one tablet for 4 days. 6 each 0  09/09/2015 at Unknown time  . chlorpheniramine-HYDROcodone (TUSSIONEX) 10-8 MG/5ML SUER Take 5 mLs by mouth every 12 (twelve) hours as needed for cough (cough, will cause drowsiness.). 120 mL 0 09/09/2015 at Unknown time  . dextromethorphan-guaiFENesin (MUCINEX DM) 30-600 MG 12hr tablet Take 1 tablet by mouth 2 (two) times daily as needed for cough.   09/09/2015 at Unknown time  . Fish Oil OIL 2 capsules by Does not apply route daily.    Past Week at Unknown time  . gabapentin (NEURONTIN) 600 MG tablet TAKE 1 TABLET BY MOUTH THREE TIMES DAILY 270 tablet 0 09/10/2015 at Unknown time  . ipratropium-albuterol (DUONEB) 0.5-2.5 (3) MG/3ML SOLN Take 3 mLs by nebulization every 4 (four) hours as needed (wheezing/cough/sob). 360 mL 1 09/09/2015 at Unknown time  . lurasidone (LATUDA) 20 MG TABS tablet Take 1 tablet (20 mg total) by mouth daily. 30 tablet 2 09/09/2015 at Unknown time  . nystatin (MYCOSTATIN) 100000 UNIT/ML suspension Take 5 mLs (500,000 Units total) by mouth 4 (four) times daily. Swish in mouth and swallow. Use until asymptomatic from thrush for two days. 120 mL 1 Past Month at Unknown time  . pantoprazole (PROTONIX) 40 MG tablet TAKE 1 TABLET BY MOUTH DAILY 90 tablet 0 09/10/2015 at Unknown time  . potassium chloride SA (K-DUR,KLOR-CON) 20 MEQ tablet Take 1 tablet (20  mEq total) by mouth daily. 30 tablet 0 09/10/2015 at Unknown time  . promethazine (PHENERGAN) 25 MG tablet Take 1 tablet (25 mg total) by mouth every 6 (six) hours as needed for nausea or vomiting. 30 tablet 0 Past Month at Unknown time  . tiZANidine (ZANAFLEX) 4 MG tablet TAKE 1/2 TO 1 TABLET BY MOUTH EVERY 6 HOURS AS NEEDED FOR MUSCLE SPASMS 120 tablet 5 09/09/2015 at Unknown time  . topiramate (TOPAMAX) 50 MG tablet TAKE 1 TABLET BY MOUTH ONCE OR TWICE DAILY TO HELP PREVENT MIGRAINES 60 tablet 0 09/10/2015 at Unknown time  . valACYclovir (VALTREX) 500 MG tablet Take 1 tablet (500 mg total) by mouth 2 (two) times daily. (Patient taking  differently: Take 500 mg by mouth daily. ) 10 tablet 0 09/10/2015 at Unknown time  . cetirizine (ZYRTEC) 10 MG tablet Take 10 mg by mouth daily as needed for allergies.    More than a month at Unknown time  . Multiple Vitamin (MULTIVITAMIN) capsule Take 1 capsule by mouth daily.   Taking    Allergies  Allergen Reactions  . Adhesive [Tape] Other (See Comments)    Blisters, paper tape is worse  . Celecoxib Nausea And Vomiting  . Erythromycin Nausea And Vomiting  . Lyrica [Pregabalin]     Induced lactation  . Morphine And Related     Decrease bp  . Penicillins Hives    Has patient had a PCN reaction causing immediate rash, facial/tongue/throat swelling, SOB or lightheadedness with hypotension: Yes Has patient had a PCN reaction causing severe rash involving mucus membranes or skin necrosis: No Has patient had a PCN reaction that required hospitalization No Has patient had a PCN reaction occurring within the last 10 years: Yes If all of the above answers are "NO", then may proceed with Cephalosporin use.   . Latex Rash   Social History:   reports that she has been smoking Cigarettes.  She has a 20 pack-year smoking history. She has never used smokeless tobacco. She reports that she does not drink alcohol or use illicit drugs. Family History  Problem Relation Age of Onset  . Depression Father   . Depression Sister   . Heart disease Father   . Mitral valve prolapse Father   . Aneurysm Mother     brain  . Diabetes Father   . Hypertension Father   . Breast cancer Paternal Grandfather   . Breast cancer Paternal Aunt     Review of Systems: Noncontributory  PHYSICAL EXAM: Blood pressure 131/83, pulse 63, temperature 98.1 F (36.7 C), temperature source Oral, resp. rate 18, last menstrual period 07/28/2015, SpO2 98 %. General appearance - alert, well appearing, and in no distress Chest - clear to auscultation, no wheezes, rales or rhonchi, symmetric air entry Heart - normal rate and  regular rhythm Abdomen - soft, nontender, nondistended, no masses or organomegaly Pelvic - examination not indicated Extremities - peripheral pulses normal, no pedal edema, no clubbing or cyanosis  Labs: Results for orders placed or performed during the hospital encounter of 09/10/15 (from the past 336 hour(s))  Pregnancy, urine   Collection Time: 09/10/15  8:50 AM  Result Value Ref Range   Preg Test, Ur NEGATIVE NEGATIVE  Results for orders placed or performed during the hospital encounter of 09/03/15 (from the past 336 hour(s))  CBC   Collection Time: 09/03/15  1:45 PM  Result Value Ref Range   WBC 12.5 (H) 4.0 - 10.5 K/uL   RBC 5.04 3.87 -  5.11 MIL/uL   Hemoglobin 15.6 (H) 12.0 - 15.0 g/dL   HCT 44.4 36.0 - 46.0 %   MCV 88.1 78.0 - 100.0 fL   MCH 31.0 26.0 - 34.0 pg   MCHC 35.1 30.0 - 36.0 g/dL   RDW 14.1 11.5 - 15.5 %   Platelets 230 150 - 400 K/uL  Type and screen   Collection Time: 09/03/15  1:45 PM  Result Value Ref Range   ABO/RH(D) A NEG    Antibody Screen NEG    Sample Expiration 09/17/2015    Extend sample reason NO TRANSFUSIONS OR PREGNANCY IN THE PAST 3 MONTHS   ABO/Rh   Collection Time: 09/03/15  1:45 PM  Result Value Ref Range   ABO/RH(D) A NEG   Basic metabolic panel   Collection Time: 09/03/15  1:50 PM  Result Value Ref Range   Sodium 136 135 - 145 mmol/L   Potassium 3.0 (L) 3.5 - 5.1 mmol/L   Chloride 98 (L) 101 - 111 mmol/L   CO2 25 22 - 32 mmol/L   Glucose, Bld 99 65 - 99 mg/dL   BUN 14 6 - 20 mg/dL   Creatinine, Ser 0.86 0.44 - 1.00 mg/dL   Calcium 9.3 8.9 - 10.3 mg/dL   GFR calc non Af Amer >60 >60 mL/min   GFR calc Af Amer >60 >60 mL/min   Anion gap 13 5 - 15    Imaging Studies: 07/10/2015 CLINICAL DATA: History of menometrorrhagia, previous history of amenorrhea, history of polycystic ovarian syndrome, obesity. Onset of last normal menstrual period was May 28, 2015.  EXAM: TRANSABDOMINAL AND TRANSVAGINAL ULTRASOUND OF  PELVIS  TECHNIQUE: Both transabdominal and transvaginal ultrasound examinations of the pelvis were performed. Transabdominal technique was performed for global imaging of the pelvis including uterus, ovaries, adnexal regions, and pelvic cul-de-sac. It was necessary to proceed with endovaginal exam following the transabdominal exam to visualize the endometrium and ovaries.  COMPARISON: None in PACs  FINDINGS: Uterus  Measurements: 6.8 x 3.5 x 4.6 cm. No fibroids or other mass visualized.  Endometrium  Thickness: 14 mm. Within the endometrium there are multiple tiny anechoic cystic appearing regions.  Right ovary  Measurements: 3.1 x 2.4 x 2.8 cm. There is an exophytic right ovarian cyst measuring 1.6 cm. There are no findings suspicious for polycystic ovarian syndrome.  Left ovary  Measurements: 3.1 x 2.4 x 2.4 cm. Normal appearance/no adnexal mass.  Other findings  No free fluid.  IMPRESSION: 1. The uterus is normal in contour with no evidence of fibrosis. The endometrial stripe is mildly thickened. The patient is over to her menstrual period. There are tiny cystic areas present within the endometrium. There is no evidence of a gestational sac. Correlation with patient's beta HCG is needed. If bleeding remains unresponsive to hormonal or medical therapy, sonohysterogram should be considered for focal lesion work-up. (Ref: Radiological Reasoning: Algorithmic Workup of Abnormal Vaginal Bleeding with Endovaginal Sonography and Sonohysterography. AJR 2008; [id number]) 2. Exophytic 1.6 cm right ovarian cyst. The echotexture of both the right and left ovaries is otherwise unremarkable.  Assessment: Patient Active Problem List   Diagnosis Date Noted  . Generalized anxiety disorder 09/10/2015  . Abnormal uterine bleeding (AUB) 09/10/2015  . Ovarian cyst, right 07/15/2015  . Amenorrhea 07/15/2015  . 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 Other migraine without status migrainosus, not intractable 03/07/2015  . Bipolar I disorder, most recent episode depressed ([place]) 10/11/2014  . Hyperlipidemia 01/03/2014  . History of  nephrolithiasis 12/27/2013  . Asthma, chronic 12/27/2013  . Essential hypertension, benign 12/27/2013  . Fibromyalgia 12/27/2013  . Perforation of left tympanic membrane 12/27/2013  . Muscle spasm 12/27/2013  . GERD (gastroesophageal reflux disease) 12/27/2013  . Leukocytosis 12/27/2013  . History of cholecystectomy 09/11/2010    Plan: Patient will undergo surgical management with Robot assisted total laparoscopic hysterectomy with bilateral salpingectomy.   The risks of surgery were discussed in detail with the patient including but not limited to: bleeding which may require transfusion or reoperation; infection which may require antibiotics; injury to surrounding organs which may involve bowel, bladder, ureters ; need for additional procedures including laparoscopy or laparotomy; thromboembolic phenomenon, surgical site problems and other postoperative/anesthesia complications. Likelihood of success in alleviating the patient's condition was discussed. Routine postoperative instructions will be reviewed with the patient and her family in detail after surgery.  The patient concurred with the proposed plan, giving informed written consent for the surgery.  Patient has been NPO since last night she will remain NPO for procedure.  Anesthesia and OR aware.  Preoperative prophylactic antibiotics and SCDs ordered on call to the OR.  To OR when ready.  Rielle Schlauch L. Ihor Dow, M.D., [facility] 09/10/2015 9:53 AM

## 2015-09-10 NOTE — Brief Op Note (Signed)
09/10/2015  1:25 PM  PATIENT:  Margaret Lawson  35 y.o. female  PRE-OPERATIVE DIAGNOSIS:  Right ovarian cyst with abnormal uterine bleeding and simple hyperplasia  POST-OPERATIVE DIAGNOSIS:  Right ovarian cyst with abnormal uterine bleeding and simple hyperplasia  PROCEDURE:  Procedure(s): ROBOTIC ASSISTED TOTAL HYSTERECTOMY WITH BILATERAL SALPINGECTOMY (Bilateral)  SURGEON:  Surgeon(s) and Role:    * Lavonia Drafts, MD - Primary    * Donnamae Jude, MD - Assisting  ANESTHESIA:   general  EBL:  Total I/O In: 1000 [I.V.:1000] Out: 700 [Urine:600; Blood:100]  BLOOD ADMINISTERED:none  DRAINS: none   LOCAL MEDICATIONS USED:  MARCAINE     SPECIMEN:  Source of Specimen:  left fallopian tube with mass attached; Right fallopian tube with cyst and uterus and cervix  DISPOSITION OF SPECIMEN:  PATHOLOGY  COUNTS:  YES  TOURNIQUET:  * No tourniquets in log *  DICTATION: .Note written in EPIC  PLAN OF CARE: discharge to home after porlonged observation  PATIENT DISPOSITION:  PACU - hemodynamically stable.   Delay start of Pharmacological VTE agent (>24hrs) due to surgical blood loss or risk of bleeding: yes  Complications: none immediate

## 2015-09-10 NOTE — Progress Notes (Signed)
Patient discharged home with husband... Discharge instructions reviewed with patient and she verbalized udnerstanding.... Condition stable... No equipment... Ambulated to car with Santiago Bur, NT.

## 2015-09-10 NOTE — Transfer of Care (Signed)
Immediate Anesthesia Transfer of Care Note  Patient: Margaret Lawson  Procedure(s) Performed: Procedure(s): ROBOTIC ASSISTED TOTAL HYSTERECTOMY WITH BILATERAL SALPINGECTOMY (Bilateral)  Patient Location: PACU  Anesthesia Type:General  Level of Consciousness: awake and sedated  Airway & Oxygen Therapy: Patient Spontanous Breathing and Patient connected to nasal cannula oxygen  Post-op Assessment: Report given to RN and Post -op Vital signs reviewed and stable  Post vital signs: Reviewed and stable  Last Vitals:  Filed Vitals:   09/10/15 0912  BP: 131/83  Pulse: 63  Temp: 36.7 C  Resp: 18    Complications: No apparent anesthesia complications

## 2015-09-10 NOTE — Anesthesia Postprocedure Evaluation (Signed)
Anesthesia Post Note  Patient: Margaret Lawson  Procedure(s) Performed: Procedure(s) (LRB): ROBOTIC ASSISTED TOTAL HYSTERECTOMY WITH BILATERAL SALPINGECTOMY (Bilateral)  Patient location during evaluation: PACU Anesthesia Type: General Level of consciousness: sedated Pain management: satisfactory to patient Vital Signs Assessment: post-procedure vital signs reviewed and stable Respiratory status: spontaneous breathing Cardiovascular status: stable Anesthetic complications: no    Last Vitals:  Filed Vitals:   09/10/15 1345 09/10/15 1400  BP: 120/77 115/62  Pulse: 56 57  Temp:    Resp: 17 16    Last Pain:  Filed Vitals:   09/10/15 1408  PainSc: 3                  [name] EDWARD

## 2015-09-10 NOTE — Discharge Instructions (Signed)
Total Laparoscopic Hysterectomy, Care After °Refer to this sheet in the next few weeks. These instructions provide you with information on caring for yourself after your procedure. Your health care provider may also give you more specific instructions. Your treatment has been planned according to current medical practices, but problems sometimes occur. Call your health care provider if you have any problems or questions after your procedure. °WHAT TO EXPECT AFTER THE PROCEDURE °· Pain and bruising at the incision sites. You will be given pain medicine to control it. °· Menopausal symptoms such as hot flashes, night sweats, and insomnia if your ovaries were removed. °· Sore throat from the breathing tube that was inserted during surgery. °HOME CARE INSTRUCTIONS °· Only take over-the-counter or prescription medicines for pain, discomfort, or fever as directed by your health care provider.   °· Do not take aspirin. It can cause bleeding.   °· Do not drive when taking pain medicine.   °· Follow your health care provider's advice regarding diet, exercise, lifting, driving, and general activities.   °· Resume your usual diet as directed and allowed.   °· Get plenty of rest and sleep.   °· Do not douche, use tampons, or have sexual intercourse for at least 6 weeks, or until your health care provider gives you permission.   °· Change your bandages (dressings) as directed by your health care provider.   °· Monitor your temperature and notify your health care provider of a fever.   °· Take showers instead of baths for 2-3 weeks.   °· Do not drink alcohol until your health care provider gives you permission.   °· If you develop constipation, you may take a mild laxative with your health care provider's permission. Bran foods may help with constipation problems. Drinking enough fluids to keep your urine clear or pale yellow may help as well.   °· Try to have someone home with you for 1-2 weeks to help around the house.    °· Keep all of your follow-up appointments as directed by your health care provider.   °SEEK MEDICAL CARE IF: °· You have swelling, redness, or increasing pain around your incision sites.   °· You have pus coming from your incision.   °· You notice a bad smell coming from your incision.   °· Your incision breaks open.   °· You feel dizzy or lightheaded.   °· You have pain or bleeding when you urinate.   °· You have persistent diarrhea.   °· You have persistent nausea and vomiting.   °· You have abnormal vaginal discharge.   °· You have a rash.   °· You have any type of abnormal reaction or develop an allergy to your medicine.   °· You have poor pain control with your prescribed medicine.   °SEEK IMMEDIATE MEDICAL CARE IF: °· You have chest pain or shortness of breath. °· You have severe abdominal pain that is not relieved with pain medicine. °· You have pain or swelling in your legs. °MAKE SURE YOU: °· Understand these instructions. °· Will watch your condition. °· Will get help right away if you are not doing well or get worse. °  °This information is not intended to replace advice given to you by your health care provider. Make sure you discuss any questions you have with your health care provider. °  °Document Released: 05/03/2013 Document Revised: 07/18/2013 Document Reviewed: 05/03/2013 °Elsevier Interactive Patient Education ©2016 Elsevier Inc. ° °

## 2015-09-10 NOTE — Addendum Note (Signed)
Addendum  created 09/10/15 1704 by Lyndle Herrlich, MD   Modules edited: Anesthesia Events, Narrator   Narrator:  Narrator: Event Log Edited

## 2015-09-11 ENCOUNTER — Encounter (HOSPITAL_COMMUNITY): Payer: Self-pay | Admitting: Obstetrics & Gynecology

## 2015-09-11 ENCOUNTER — Other Ambulatory Visit: Payer: Self-pay | Admitting: Family Medicine

## 2015-09-11 ENCOUNTER — Telehealth: Payer: Self-pay | Admitting: Obstetrics & Gynecology

## 2015-09-11 MED FILL — Propofol IV Emul 200 MG/20ML (10 MG/ML): INTRAVENOUS | Qty: 20 | Status: AC

## 2015-09-11 NOTE — Telephone Encounter (Signed)
TC to pt to see how she was feeling post op.  She reports good pain control. Pt is only taking 1 Percocet at a time.  Pt ate with no nausea or emesis.  She is drinking lots of fluids.  She reports that she is walking around without difficulty.  She voiding without difficulty.  She reports that she cannot lie flat so is propped up and feels good.  Pt has taken Motrin without difficulty and was told that she could take that with food.  Pt will be called with an appt for 2 weeks.  She was encouraged to call us with questions.  Savanah Bayles L. Harraway-Smith, M.D., Cherlynn June

## 2015-09-16 ENCOUNTER — Other Ambulatory Visit: Payer: Self-pay | Admitting: Family Medicine

## 2015-09-16 ENCOUNTER — Telehealth: Payer: Self-pay | Admitting: *Deleted

## 2015-09-16 NOTE — Telephone Encounter (Signed)
Lizmarie called and left a message she had robotic hysterectomy and wants to know what she can take for constipation. Also states due to antibiotics she has a yeast infection and wants to take diflucan not any vaginal creams.

## 2015-09-17 ENCOUNTER — Encounter: Payer: Self-pay | Admitting: Anesthesiology

## 2015-09-17 ENCOUNTER — Telehealth: Payer: Self-pay | Admitting: *Deleted

## 2015-09-17 DIAGNOSIS — B379 Candidiasis, unspecified: Secondary | ICD-10-CM

## 2015-09-17 MED ORDER — PROPOFOL 500 MG/50ML IV EMUL
INTRAVENOUS | Status: DC | PRN
  Administered 2015-09-10: 20 mL via INTRAVENOUS

## 2015-09-17 MED ORDER — FLUCONAZOLE 150 MG PO TABS: ORAL_TABLET | ORAL | Status: DC

## 2015-09-17 NOTE — Addendum Note (Signed)
Addendum  created 09/17/15 2301 by Vernice Jefferson, CRNA   Modules edited: Anesthesia Medication Administration

## 2015-09-17 NOTE — Telephone Encounter (Signed)
Pt called wanting to know if she could take metamucil.  She states that she has taken it in past and works well.  She also states that she has a yeast infection with itching, burning and thick white d/c.  She requested Diflucan instead of OTC creams because she did not want to put anything into the vagina.

## 2015-09-18 ENCOUNTER — Encounter: Payer: Self-pay | Admitting: Hematology & Oncology

## 2015-09-20 ENCOUNTER — Encounter: Payer: Self-pay | Admitting: Family Medicine

## 2015-09-20 ENCOUNTER — Ambulatory Visit (INDEPENDENT_AMBULATORY_CARE_PROVIDER_SITE_OTHER): Payer: BLUE CROSS/BLUE SHIELD | Admitting: Family Medicine

## 2015-09-20 DIAGNOSIS — Z0289 Encounter for other administrative examinations: Secondary | ICD-10-CM

## 2015-09-20 NOTE — Progress Notes (Signed)
No show 09/20/2015

## 2015-09-27 ENCOUNTER — Other Ambulatory Visit: Payer: Self-pay | Admitting: Hematology & Oncology

## 2015-09-27 ENCOUNTER — Ambulatory Visit (INDEPENDENT_AMBULATORY_CARE_PROVIDER_SITE_OTHER): Payer: BLUE CROSS/BLUE SHIELD | Admitting: Family Medicine

## 2015-09-27 VITALS — BP 128/88 | HR 83 | Wt 226.0 lb

## 2015-09-27 DIAGNOSIS — I10 Essential (primary) hypertension: Secondary | ICD-10-CM

## 2015-09-27 DIAGNOSIS — E876 Hypokalemia: Secondary | ICD-10-CM | POA: Diagnosis not present

## 2015-09-27 DIAGNOSIS — B9689 Other specified bacterial agents as the cause of diseases classified elsewhere: Secondary | ICD-10-CM

## 2015-09-27 DIAGNOSIS — A499 Bacterial infection, unspecified: Secondary | ICD-10-CM | POA: Diagnosis not present

## 2015-09-27 DIAGNOSIS — J329 Chronic sinusitis, unspecified: Secondary | ICD-10-CM

## 2015-09-27 MED ORDER — TRIAMTERENE-HCTZ 50-25 MG PO CAPS: 1.0000 | ORAL_CAPSULE | ORAL | Status: DC

## 2015-09-27 MED ORDER — POTASSIUM CHLORIDE ER 10 MEQ PO CPCR: 10.0000 meq | ORAL_CAPSULE | Freq: Two times a day (BID) | ORAL | Status: DC

## 2015-09-27 MED ORDER — DOXYCYCLINE HYCLATE 100 MG PO TABS: ORAL_TABLET | ORAL | Status: AC

## 2015-09-27 MED ORDER — ATENOLOL 100 MG PO TABS: 100.0000 mg | ORAL_TABLET | Freq: Every day | ORAL | Status: DC

## 2015-09-30 ENCOUNTER — Encounter (HOSPITAL_COMMUNITY): Payer: Self-pay | Admitting: *Deleted

## 2015-09-30 ENCOUNTER — Encounter: Payer: Self-pay | Admitting: Obstetrics & Gynecology

## 2015-09-30 ENCOUNTER — Encounter: Payer: Self-pay | Admitting: Family Medicine

## 2015-09-30 ENCOUNTER — Ambulatory Visit (INDEPENDENT_AMBULATORY_CARE_PROVIDER_SITE_OTHER): Payer: BLUE CROSS/BLUE SHIELD | Admitting: Obstetrics & Gynecology

## 2015-09-30 VITALS — BP 130/66 | HR 74 | Ht 69.0 in | Wt 234.0 lb

## 2015-09-30 DIAGNOSIS — Z09 Encounter for follow-up examination after completed treatment for conditions other than malignant neoplasm: Secondary | ICD-10-CM

## 2015-09-30 NOTE — Patient Instructions (Signed)
Hormone Therapy At menopause, your body begins making less estrogen and progesterone hormones. This causes the body to stop having menstrual periods. This is because estrogen and progesterone hormones control your periods and menstrual cycle. A lack of estrogen may cause symptoms such as:  Hot flushes (or hot flashes).  Vaginal dryness.  Dry skin.  Loss of sex drive.  Risk of bone loss (osteoporosis). When this happens, you may choose to take hormone therapy to get back the estrogen lost during menopause. When the hormone estrogen is given alone, it is usually referred to as ET (Estrogen Therapy). When the hormone progestin is combined with estrogen, it is generally called HT (Hormone Therapy). This was formerly known as hormone replacement therapy (HRT). Your caregiver can help you make a decision on what will be best for you. The decision to use HT seems to change often as new studies are done. Many studies do not agree on the benefits of hormone replacement therapy. LIKELY BENEFITS OF HT INCLUDE PROTECTION FROM:  Hot Flushes (also called hot flashes) - A hot flush is a sudden feeling of heat that spreads over the face and body. The skin may redden like a blush. It is connected with sweats and sleep disturbance. Women going through menopause may have hot flushes a few times a month or several times per day depending on the woman.  Osteoporosis (bone loss) - Estrogen helps guard against bone loss. After menopause, a woman's bones slowly lose calcium and become weak and brittle. As a result, bones are more likely to break. The hip, wrist, and spine are affected most often. Hormone therapy can help slow bone loss after menopause. Weight bearing exercise and taking calcium with vitamin D also can help prevent bone loss. There are also medications that your caregiver can prescribe that can help prevent osteoporosis.  Vaginal dryness - Loss of estrogen causes changes in the vagina. Its lining may  become thin and dry. These changes can cause pain and bleeding during sexual intercourse. Dryness can also lead to infections. This can cause burning and itching. (Vaginal estrogen treatment can help relieve pain, itching, and dryness.)  Urinary tract infections are more common after menopause because of lack of estrogen. Some women also develop urinary incontinence because of low estrogen levels in the vagina and bladder.  Possible other benefits of estrogen include a positive effect on mood and short-term memory in women. RISKS AND COMPLICATIONS  Using estrogen alone without progesterone causes the lining of the uterus to grow. This increases the risk of lining of the uterus (endometrial) cancer. Your caregiver should give another hormone called progestin if you have a uterus.  Women who take combined (estrogen and progestin) HT appear to have an increased risk of breast cancer. The risk appears to be small, but increases throughout the time that HT is taken.  Combined therapy also makes the breast tissue slightly denser which makes it harder to read mammograms (breast X-rays).  Combined, estrogen and progesterone therapy can be taken together every day, in which case there may be spotting of blood. HT therapy can be taken cyclically in which case you will have menstrual periods. Cyclically means HT is taken for a set amount of days, then not taken, then this process is repeated.  HT may increase the risk of stroke, heart attack, breast cancer and forming blood clots in your leg.  Transdermal estrogen (estrogen that is absorbed through the skin with a patch or a cream) may have better results with:  Cholesterol.  Blood pressure.  Blood clots. Having the following conditions may indicate you should not have HT:  Endometrial cancer.  Liver disease.  Breast cancer.  Heart disease.  History of blood clots.  Stroke. TREATMENT   If you choose to take HT and have a uterus, usually  estrogen and progestin are prescribed.  Your caregiver will help you decide the best way to take the medications.  Possible ways to take estrogen include:  Pills.  Patches.  Gels.  Sprays.  Vaginal estrogen cream, rings and tablets.  It is best to take the lowest dose possible that will help your symptoms and take them for the shortest period of time that you can.  Hormone therapy can help relieve some of the problems (symptoms) that affect women at menopause. Before making a decision about HT, talk to your caregiver about what is best for you. Be well informed and comfortable with your decisions. HOME CARE INSTRUCTIONS   Follow your caregivers advice when taking the medications.  A Pap test is done to screen for cervical cancer.  The first Pap test should be done at age 34.  Between ages 80 and 52, Pap tests are repeated every 2 years.  Beginning at age 13, you are advised to have a Pap test every 3 years as long as the past 3 Pap tests have been normal.  Some women have medical problems that increase the chance of getting cervical cancer. Talk to your caregiver about these problems. It is especially important to talk to your caregiver if a new problem develops soon after your last Pap test. In these cases, your caregiver may recommend more frequent screening and Pap tests.  The above recommendations are the same for women who have or have not gotten the vaccine for HPV (human papillomavirus).  If you had a hysterectomy for a problem that was not a cancer or a condition that could lead to cancer, then you no longer need Pap tests. However, even if you no longer need a Pap test, a regular exam is a good idea to make sure no other problems are starting.  If you are between ages 20 and 60, and you have had normal Pap tests going back 10 years, you no longer need Pap tests. However, even if you no longer need a Pap test, a regular exam is a good idea to make sure no other problems  are starting.  If you have had past treatment for cervical cancer or a condition that could lead to cancer, you need Pap tests and screening for cancer for at least 20 years after your treatment.  If Pap tests have been discontinued, risk factors (such as a new sexual partner)need to be re-assessed to determine if screening should be resumed.  Some women may need screenings more often if they are at high risk for cervical cancer.  Get mammograms done as per the advice of your caregiver. SEEK IMMEDIATE MEDICAL CARE IF:  You develop abnormal vaginal bleeding.  You have pain or swelling in your legs, shortness of breath, or chest pain.  You develop dizziness or headaches.  You have lumps or changes in your breasts or armpits.  You have slurred speech.  You develop weakness or numbness of your arms or legs.  You have pain, burning, or bleeding when urinating.  You develop abdominal pain.   This information is not intended to replace advice given to you by your health care provider. Make sure you discuss any questions  you have with your health care provider.   Document Released: 04/11/2003 Document Revised: 11/27/2014 Document Reviewed: 01/14/2015 Elsevier Interactive Patient Education 2016 Elsevier Inc.  

## 2015-09-30 NOTE — Progress Notes (Signed)
Patient ID: Margaret Lawson, female   DOB: [birth year], 35 y.o.   MRN: [id number] History:  35 y.o. G0P0000 here today for 3 week post op check after [facility] with bilateral salpingectomy.  She reports dark red discharge in small amount. No odor.  Min pain.  She is voiding and passing stools without difficulty.   The following portions of the patient's history were reviewed and updated as appropriate: allergies, current medications, past family history, past medical history, past social history, past surgical history and problem list.  Review of Systems:  Pertinent items are noted in HPI.  Objective:  Physical Exam Blood pressure 130/66, pulse 74, height 5\' 9"  (1.753 m), weight 234 lb (106.142 kg). Gen: NAD Abd: Soft, nontender and nondistended; port sites well healed Pelvic: not done  Labs and Imaging 09/10/2015 Diagnosis Uterus and bilateral fallopian tubes, cervix, cyst on left tube, mass from right tube - UTERUS: -ENDOMETRIUM: SIMPLE AND COMPLEX HYPERPLASIA WITHOUT ATYPIA. NO CARCINOMA. -MYOMETRIUM: UNREMARKABLE. NO MALIGNANCY. -SEROSA: UNREMARKABLE. NO MALIGNANCY. - CERVIX: BENIGN SQUAMOUS AND ENDOCERVICAL MUCOSA. NO DYSPLASIA OR MALIGNANCY. - RIGHT FALLOPIAN TUBE: SEROUS CYSTADENOFIBROMA. NO CARCINOMA. - LEFT FALLOPIAN TUBE: SEROUS BORDERLINE TUMOR, 2.3 CM. NO INVASIVE CARCINOMA. SEE COMMENT. 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 The tumor appears limited to the inner cyst lining. There is no invasive carcinoma. Dr. Avis Epley  Assessment & Plan:  Pt is doing well post op from [facility] with bilateral salpingectomy.  Reviewed surg path and rec from [facility] to perform a bilateral oophorectomy.  Both pt and husband wish to proceed with oophorectomy.  All questions answered.   F/u in 3 weeks for 6 weeks post op check Nothing per vagina for 8 full weeks post op  Patient desires surgical management with laparoscopic bilateral oophorectomy.  The  risks of surgery were discussed in detail with the patient including but not limited to: bleeding which may require transfusion or reoperation; infection which may require prolonged hospitalization or re-hospitalization and antibiotic therapy; injury to bowel, bladder, ureters and major vessels or other surrounding organs; need for additional procedures including laparotomy; thromboembolic phenomenon, incisional problems and other postoperative or anesthesia complications.  Patient was told that the likelihood that her condition and symptoms will be treated effectively with this surgical management was very high; the postoperative expectations were also discussed in detail. The patient also understands the alternative treatment options which were discussed in full. All questions were answered.  She was told that she will be contacted by our surgical scheduler regarding the time and date of her surgery; routine preoperative instructions of having nothing to eat or drink after midnight on the day prior to surgery and also coming to the hospital 1 1/2 hours prior to her time of surgery were also emphasized.  She was told she may be called for a preoperative appointment about a week prior to surgery and will be given further preoperative instructions at that visit. Printed patient education handouts about the procedure were given to the patient to review at home.

## 2015-09-30 NOTE — Progress Notes (Signed)
CC: Margaret Lawson is a 35 y.o. female is here for potassium levels   Subjective: HPI:  Over the past month she's had 2 metabolic panels reflecting hypokalemia. After starting a Potassium supplement she's noticed that she's no longer experiencing some cramping that she was experiencing the lower extremities. She denies any irregular heartbeat or chest discomfort. No confusion or urinary complaints.  Facial pressure behind both cheeks. Accompanied by postnasal drip and nonproductive cough, symptoms of been present for the past 7-8 days. Symptoms are worsening. Denies fevers, chills, wheezing, chest discomfort. Symptoms are moderate in severity.   Review Of Systems Outlined In HPI  Past Medical History  Diagnosis Date  . Asthma, chronic 12/27/2013  . Essential hypertension, benign 12/27/2013  . Fibromyalgia 12/27/2013  . Leukocytosis 12/27/2013  . Perforation of left tympanic membrane 12/27/2013  . PCOS (polycystic ovarian syndrome)   . Bipolar 2 disorder ([place])   . Anxiety     severe  . GERD (gastroesophageal reflux disease)   . Headache     migraines, over stimulation  . Right leg pain   . Restless leg syndrome   . Low iron   . Teeth clenching   . Teeth grinding   . Poor dentition     right side upper and lower  . Weakness     when exposed to hot or cold temperatures    Past Surgical History  Procedure Laterality Date  . Cholecystectomy    . Wisdom tooth extraction    . Robotic assisted total hysterectomy with bilateral salpingo oopherectomy Bilateral 09/10/2015    Procedure: ROBOTIC ASSISTED TOTAL HYSTERECTOMY WITH BILATERAL SALPINGECTOMY;  Surgeon: Lavonia Drafts, MD;  Location: [facility];  Service: Gynecology;  Laterality: Bilateral;   Family History  Problem Relation Age of Onset  . Depression Father   . Depression Sister   . Heart disease Father   . Mitral valve prolapse Father   . Aneurysm Mother     brain  . Diabetes Father   . Hypertension Father   . Breast  cancer Paternal Grandfather   . Breast cancer Paternal Aunt     Social History   Social History  . Marital Status: Married    Spouse Name: N/A  . Number of Children: N/A  . Years of Education: N/A   Occupational History  . unemployed    Social History Main Topics  . Smoking status: Current Every Day Smoker -- 1.00 packs/day for 20 years    Types: Cigarettes  . Smokeless tobacco: Never Used     Comment: uses 6mg  vapes  . Alcohol Use: No  . Drug Use: No  . Sexual Activity:    Partners: Male    Birth Control/ Protection: None   Other Topics Concern  . Not on file   Social History Narrative     Objective: BP 128/88 mmHg  Pulse 83  Wt 226 lb (102.513 kg)  SpO2 94%  General: Alert and Oriented, No Acute Distress HEENT: Pupils equal, round, reactive to light. Conjunctivae clear.  External ears unremarkable, canals clear with intact TMs with appropriate landmarks.  Middle ear appears open without effusion. Pink inferior turbinates.  Moist mucous membranes, pharynx without inflammation nor lesions.  Neck supple without palpable lymphadenopathy nor abnormal masses. Lungs: Clear to auscultation bilaterally, no wheezing/ronchi/rales.  Comfortable work of breathing. Good air movement. Cardiac: Regular rate and rhythm. Normal S1/S2.  No murmurs, rubs, nor gallops.   Extremities: No peripheral edema.  Strong peripheral pulses.  Mental Status: No  depression, anxiety, nor agitation. Skin: Warm and dry.  Assessment & Plan: Margaret Lawson was seen today for potassium levels.  Diagnoses and all orders for this visit:  Essential hypertension, benign -     atenolol (TENORMIN) 100 MG tablet; Take 1 tablet (100 mg total) by mouth daily.  Hypokalemia -     triamterene-hydrochlorothiazide (DYAZIDE) 50-25 MG capsule; Take 1 capsule by mouth every morning. -     potassium chloride (MICRO-K) 10 MEQ CR capsule; Take 1 capsule (10 mEq total) by mouth 2 (two) times daily. -      Potassium  Bacterial sinusitis  Other orders -     doxycycline (VIBRA-TABS) 100 MG tablet; One by mouth twice a day for ten days.   Essential hypertension: Hypokalemia most likely secondary to chlorthalidone therefore stopping this medication and continue atenolol but starting Dyazide. Spectral sinusitis. Start doxycycline. Consider nasal saline washes.  25 minutes spent face-to-face during visit today of which at least 50% was counseling or coordinating care regarding: 1. Essential hypertension, benign   2. Hypokalemia   3. Bacterial sinusitis      Return for one week potassium check.

## 2015-10-01 ENCOUNTER — Other Ambulatory Visit: Payer: Self-pay | Admitting: Family Medicine

## 2015-10-02 ENCOUNTER — Telehealth: Payer: Self-pay | Admitting: Family Medicine

## 2015-10-02 MED ORDER — TRIAMTERENE-HCTZ 37.5-25 MG PO TABS: 1.0000 | ORAL_TABLET | Freq: Every day | ORAL | Status: DC

## 2015-10-02 NOTE — Telephone Encounter (Signed)
Will you please let patient patient know that her pharmacy does not carry the original form of her potassium sparing hydrochlorothiazide but they have offered a similar alternative that I just now sent in a Rx for.

## 2015-10-02 NOTE — Telephone Encounter (Signed)
Pt.notified

## 2015-10-04 ENCOUNTER — Telehealth: Payer: Self-pay

## 2015-10-04 MED ORDER — POTASSIUM CHLORIDE 20 MEQ PO PACK: 10.0000 meq | PACK | Freq: Two times a day (BID) | ORAL | Status: DC

## 2015-10-04 NOTE — Telephone Encounter (Signed)
Will you please let patient know about this and that I'll send a different formulation.

## 2015-10-04 NOTE — Telephone Encounter (Signed)
Pt.notified

## 2015-10-04 NOTE — Telephone Encounter (Signed)
Pharmacy would do not carry micro-k in a capsule form and would like a new Rx for it to be in tablet form.

## 2015-10-14 ENCOUNTER — Telehealth: Payer: Self-pay | Admitting: *Deleted

## 2015-10-14 NOTE — Telephone Encounter (Signed)
Patient states she was on doxycycline and she states it caused blurred vision. She has since stopped the abx it has been two weeks. I suggested that she make an appointment to be evaluated. I did speak with Dr. Ileene Rubens about this and he did recommend that she come in for a vision test. The patient states she has moved to [place] and would rather have something called in for her. I advised her that without seeing her the provider would not know what to call in. It would be best to rule out anything more serious. I did tell patient that there was an UC in [place]. The patient voiced understanding and states she will figure out a way to get here.

## 2015-10-17 ENCOUNTER — Encounter: Payer: Self-pay | Admitting: Family Medicine

## 2015-10-17 ENCOUNTER — Ambulatory Visit (INDEPENDENT_AMBULATORY_CARE_PROVIDER_SITE_OTHER): Payer: BLUE CROSS/BLUE SHIELD | Admitting: Family Medicine

## 2015-10-17 ENCOUNTER — Other Ambulatory Visit: Payer: Self-pay | Admitting: Family Medicine

## 2015-10-17 DIAGNOSIS — H532 Diplopia: Secondary | ICD-10-CM | POA: Diagnosis not present

## 2015-10-17 MED ORDER — ACETAZOLAMIDE 250 MG PO TABS: 500.0000 mg | ORAL_TABLET | Freq: Two times a day (BID) | ORAL | Status: DC

## 2015-10-17 NOTE — Progress Notes (Signed)
CC: Margaret Lawson is a 35 y.o. female is here for Diplopia   Subjective: HPI:  8 days after starting doxycycline she had sudden onset of double vision. Symptoms are occurring both with farsightedness and nearsightedness. Symptoms back on better or worse despite stopping the medication immediately when the issue arose. She thinks that she had this once when she was 35 years old for one week but does not remove the specifics. She denies blurry vision she's had some nausea when she is using both eyes her vision but this improves if she only uses one eye. She's also had a dull headache on the back of the crown which has been fluctuating in severity for the past 2 weeks. She denies any other motor or sensory disturbances. There's been no fever, chills, sore throat, cough, weakness, nor any sensory disturbances other than that described above. She denies any head trauma recently or remotely. There is no positional component to her symptoms of double vision.   Review Of Systems Outlined In HPI  Past Medical History  Diagnosis Date  . Asthma, chronic 12/27/2013  . Essential hypertension, benign 12/27/2013  . Fibromyalgia 12/27/2013  . Leukocytosis 12/27/2013  . Perforation of left tympanic membrane 12/27/2013  . PCOS (polycystic ovarian syndrome)   . Bipolar 2 disorder ([place])   . Anxiety     severe  . GERD (gastroesophageal reflux disease)   . Headache     migraines, over stimulation  . Right leg pain   . Restless leg syndrome   . Low iron   . Teeth clenching   . Teeth grinding   . Poor dentition     right side upper and lower  . Weakness     when exposed to hot or cold temperatures    Past Surgical History  Procedure Laterality Date  . Cholecystectomy    . Wisdom tooth extraction    . Robotic assisted total hysterectomy with bilateral salpingo oopherectomy Bilateral 09/10/2015    Procedure: ROBOTIC ASSISTED TOTAL HYSTERECTOMY WITH BILATERAL SALPINGECTOMY;  Surgeon: Lavonia Drafts,  MD;  Location: [facility];  Service: Gynecology;  Laterality: Bilateral;   Family History  Problem Relation Age of Onset  . Depression Father   . Depression Sister   . Heart disease Father   . Mitral valve prolapse Father   . Aneurysm Mother     brain  . Diabetes Father   . Hypertension Father   . Breast cancer Paternal Grandfather   . Breast cancer Paternal Aunt     Social History   Social History  . Marital Status: Married    Spouse Name: N/A  . Number of Children: N/A  . Years of Education: N/A   Occupational History  . unemployed    Social History Main Topics  . Smoking status: Current Every Day Smoker -- 1.00 packs/day for 20 years    Types: Cigarettes  . Smokeless tobacco: Never Used     Comment: uses 6mg  vapes  . Alcohol Use: No  . Drug Use: No  . Sexual Activity:    Partners: Male    Birth Control/ Protection: None   Other Topics Concern  . Not on file   Social History Narrative     Objective: BP 146/85 mmHg  Pulse 77  Wt 245 lb (111.131 kg)  General: Alert and Oriented, No Acute Distress HEENT: Pupils equal, round, reactive to light. Conjunctivae clear.  Moist mucous membranes pharynx unremarkable. Lungs: Clear to auscultation bilaterally, no wheezing/ronchi/rales.  Comfortable work of  breathing. Good air movement. Cardiac: Regular rate and rhythm. Normal S1/S2.  No murmurs, rubs, nor gallops.   Neuro: CN II-XII grossly intact, full strength/rom of all four extremities, C5/L4/S1 DTRs 2/4 bilaterally, gait normal, rapid alternating movements normal, heel-shin test normal, peripheral vision appears grossly intact. No nystagmus with lateral gaze. Extremities: No peripheral edema.  Strong peripheral pulses.  Mental Status: No depression, anxiety, nor agitation. Skin: Warm and dry.  Assessment & Plan: Margaret Lawson was seen today for diplopia.  Diagnoses and all orders for this visit:  Double vision -     acetaZOLAMIDE (DIAMOX) 250 MG tablet; Take 2 tablets  (500 mg total) by mouth 2 (two) times daily.   Symptoms began prior to taking Maxzide, so a recent change to her medications was doxycycline which does have a slight chance of increasing intracranial pressure therefore I encouraged her to start on acetazolamide and if no better in 1 week or worsening please let me know as soon as possible so I can schedule a ophthalmology visit for visualization of her optic nerve along with consideration of MRI of the brain  25 minutes spent face-to-face during visit today of which at least 50% was counseling or coordinating care regarding: 1. Double vision      Return if symptoms worsen or fail to improve.

## 2015-10-21 ENCOUNTER — Encounter: Payer: Self-pay | Admitting: Obstetrics & Gynecology

## 2015-10-21 ENCOUNTER — Ambulatory Visit (INDEPENDENT_AMBULATORY_CARE_PROVIDER_SITE_OTHER): Payer: BLUE CROSS/BLUE SHIELD | Admitting: Obstetrics & Gynecology

## 2015-10-21 VITALS — BP 128/77 | HR 58 | Wt 241.0 lb

## 2015-10-21 DIAGNOSIS — Z9889 Other specified postprocedural states: Secondary | ICD-10-CM

## 2015-10-21 NOTE — Patient Instructions (Signed)
Hormone Therapy At menopause, your body begins making less estrogen and progesterone hormones. This causes the body to stop having menstrual periods. This is because estrogen and progesterone hormones control your periods and menstrual cycle. A lack of estrogen may cause symptoms such as:  Hot flushes (or hot flashes).  Vaginal dryness.  Dry skin.  Loss of sex drive.  Risk of bone loss (osteoporosis). When this happens, you may choose to take hormone therapy to get back the estrogen lost during menopause. When the hormone estrogen is given alone, it is usually referred to as ET (Estrogen Therapy). When the hormone progestin is combined with estrogen, it is generally called HT (Hormone Therapy). This was formerly known as hormone replacement therapy (HRT). Your caregiver can help you make a decision on what will be best for you. The decision to use HT seems to change often as new studies are done. Many studies do not agree on the benefits of hormone replacement therapy. LIKELY BENEFITS OF HT INCLUDE PROTECTION FROM:  Hot Flushes (also called hot flashes) - A hot flush is a sudden feeling of heat that spreads over the face and body. The skin may redden like a blush. It is connected with sweats and sleep disturbance. Women going through menopause may have hot flushes a few times a month or several times per day depending on the woman.  Osteoporosis (bone loss) - Estrogen helps guard against bone loss. After menopause, a woman's bones slowly lose calcium and become weak and brittle. As a result, bones are more likely to break. The hip, wrist, and spine are affected most often. Hormone therapy can help slow bone loss after menopause. Weight bearing exercise and taking calcium with vitamin D also can help prevent bone loss. There are also medications that your caregiver can prescribe that can help prevent osteoporosis.  Vaginal dryness - Loss of estrogen causes changes in the vagina. Its lining may  become thin and dry. These changes can cause pain and bleeding during sexual intercourse. Dryness can also lead to infections. This can cause burning and itching. (Vaginal estrogen treatment can help relieve pain, itching, and dryness.)  Urinary tract infections are more common after menopause because of lack of estrogen. Some women also develop urinary incontinence because of low estrogen levels in the vagina and bladder.  Possible other benefits of estrogen include a positive effect on mood and short-term memory in women. RISKS AND COMPLICATIONS  Using estrogen alone without progesterone causes the lining of the uterus to grow. This increases the risk of lining of the uterus (endometrial) cancer. Your caregiver should give another hormone called progestin if you have a uterus.  Women who take combined (estrogen and progestin) HT appear to have an increased risk of breast cancer. The risk appears to be small, but increases throughout the time that HT is taken.  Combined therapy also makes the breast tissue slightly denser which makes it harder to read mammograms (breast X-rays).  Combined, estrogen and progesterone therapy can be taken together every day, in which case there may be spotting of blood. HT therapy can be taken cyclically in which case you will have menstrual periods. Cyclically means HT is taken for a set amount of days, then not taken, then this process is repeated.  HT may increase the risk of stroke, heart attack, breast cancer and forming blood clots in your leg.  Transdermal estrogen (estrogen that is absorbed through the skin with a patch or a cream) may have better results with:  Cholesterol.  Blood pressure.  Blood clots. Having the following conditions may indicate you should not have HT:  Endometrial cancer.  Liver disease.  Breast cancer.  Heart disease.  History of blood clots.  Stroke. TREATMENT   If you choose to take HT and have a uterus, usually  estrogen and progestin are prescribed.  Your caregiver will help you decide the best way to take the medications.  Possible ways to take estrogen include:  Pills.  Patches.  Gels.  Sprays.  Vaginal estrogen cream, rings and tablets.  It is best to take the lowest dose possible that will help your symptoms and take them for the shortest period of time that you can.  Hormone therapy can help relieve some of the problems (symptoms) that affect women at menopause. Before making a decision about HT, talk to your caregiver about what is best for you. Be well informed and comfortable with your decisions. HOME CARE INSTRUCTIONS   Follow your caregivers advice when taking the medications.  A Pap test is done to screen for cervical cancer.  The first Pap test should be done at age 34.  Between ages 80 and 52, Pap tests are repeated every 2 years.  Beginning at age 13, you are advised to have a Pap test every 3 years as long as the past 3 Pap tests have been normal.  Some women have medical problems that increase the chance of getting cervical cancer. Talk to your caregiver about these problems. It is especially important to talk to your caregiver if a new problem develops soon after your last Pap test. In these cases, your caregiver may recommend more frequent screening and Pap tests.  The above recommendations are the same for women who have or have not gotten the vaccine for HPV (human papillomavirus).  If you had a hysterectomy for a problem that was not a cancer or a condition that could lead to cancer, then you no longer need Pap tests. However, even if you no longer need a Pap test, a regular exam is a good idea to make sure no other problems are starting.  If you are between ages 20 and 60, and you have had normal Pap tests going back 10 years, you no longer need Pap tests. However, even if you no longer need a Pap test, a regular exam is a good idea to make sure no other problems  are starting.  If you have had past treatment for cervical cancer or a condition that could lead to cancer, you need Pap tests and screening for cancer for at least 20 years after your treatment.  If Pap tests have been discontinued, risk factors (such as a new sexual partner)need to be re-assessed to determine if screening should be resumed.  Some women may need screenings more often if they are at high risk for cervical cancer.  Get mammograms done as per the advice of your caregiver. SEEK IMMEDIATE MEDICAL CARE IF:  You develop abnormal vaginal bleeding.  You have pain or swelling in your legs, shortness of breath, or chest pain.  You develop dizziness or headaches.  You have lumps or changes in your breasts or armpits.  You have slurred speech.  You develop weakness or numbness of your arms or legs.  You have pain, burning, or bleeding when urinating.  You develop abdominal pain.   This information is not intended to replace advice given to you by your health care provider. Make sure you discuss any questions  you have with your health care provider.   Document Released: 04/11/2003 Document Revised: 11/27/2014 Document Reviewed: 01/14/2015 Elsevier Interactive Patient Education 2016 Elsevier Inc.  

## 2015-10-21 NOTE — Progress Notes (Signed)
Subjective:     Patient ID: Margaret Lawson, female   DOB: 07-08-1981, 35 y.o.   MRN: [id number]  HPI Pt reports no problems.  She is tol reg diet and voiding and passing stools without difficulty.  She has questions re ERT after oophorectomy.   Review of Systems     Objective:   Physical Exam BP 128/77 mmHg  Pulse 58  Wt 241 lb (109.317 kg) Pt in NAD Abd; obese, NT. ND.  Port sites well healed. GU: EGBUS: no lesions Vagina: no blood in vault;cuff well healed   Adnexa: no masses; non tender  09/10/2015 Diagnosis Uterus and bilateral fallopian tubes, cervix, cyst on left tube, mass from right tube - UTERUS: -ENDOMETRIUM: SIMPLE AND COMPLEX HYPERPLASIA WITHOUT ATYPIA. NO CARCINOMA. -MYOMETRIUM: UNREMARKABLE. NO MALIGNANCY. -SEROSA: UNREMARKABLE. NO MALIGNANCY. - CERVIX: BENIGN SQUAMOUS AND ENDOCERVICAL MUCOSA. NO DYSPLASIA OR MALIGNANCY. - RIGHT FALLOPIAN TUBE: SEROUS CYSTADENOFIBROMA. NO CARCINOMA. - LEFT FALLOPIAN TUBE: SEROUS BORDERLINE TUMOR, 2.3 CM. NO INVASIVE CARCINOMA. SEE COMMENT.     Assessment:     7 week post op check after [place] with bilateral salpingectomy.  Pt doing well.     Plan:     F/u for bilateral oophorectomy due to borderline tumor on right tube.   Discussed with pt ERT post procedure   Daisuke Bailey L. Harraway-Smith, M.D., Cherlynn June

## 2015-10-23 ENCOUNTER — Telehealth: Payer: Self-pay

## 2015-10-23 DIAGNOSIS — H538 Other visual disturbances: Secondary | ICD-10-CM

## 2015-10-23 NOTE — Telephone Encounter (Signed)
An urgent referral to an opthomologist will be placed today, she can expect to be contacted in the next 24 hours.

## 2015-10-23 NOTE — Telephone Encounter (Signed)
Pt notified and stated that she made an appointment with her eye dr..  Do you still want pt to take diamox?

## 2015-10-23 NOTE — Telephone Encounter (Signed)
Pt will be seeing Dr. Alois Cliche with [facility].

## 2015-10-23 NOTE — Telephone Encounter (Signed)
No she may as well stop it, it's not doing any help

## 2015-10-23 NOTE — Telephone Encounter (Signed)
Pt stated that there she has not had any improvement with blurred vision. Please advise.

## 2015-11-12 ENCOUNTER — Telehealth: Payer: Self-pay | Admitting: *Deleted

## 2015-11-12 NOTE — Telephone Encounter (Addendum)
Margaret Lawson called and left a message she is 9 weeks postop from robotic hysterectomy and is having a problem when she is close to empyting her bladder- has spasms for 3-4 days. States she noticed a little blood this am in her urine.  States not sure what to do , please call.  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

## 2015-11-13 ENCOUNTER — Other Ambulatory Visit: Payer: Self-pay | Admitting: Family Medicine

## 2015-11-13 NOTE — Telephone Encounter (Signed)
Patient calling because she is approx 9 weeks post op and complaining of urinary spasms after the end of urination. Patient notes it happens more in the morning. Patient states she has not noticed any blood in her urine since yesterday. Patient scheduled to come in for evaluation tomorrow with Dr. Ihor Dow. Patient agrees with plan. Kathrene Alu RN BSN

## 2015-11-14 ENCOUNTER — Encounter: Payer: Self-pay | Admitting: Obstetrics & Gynecology

## 2015-11-14 ENCOUNTER — Ambulatory Visit (INDEPENDENT_AMBULATORY_CARE_PROVIDER_SITE_OTHER): Payer: BLUE CROSS/BLUE SHIELD | Admitting: Obstetrics & Gynecology

## 2015-11-14 VITALS — BP 127/75 | HR 65 | Ht 69.0 in | Wt 249.0 lb

## 2015-11-14 DIAGNOSIS — N3289 Other specified disorders of bladder: Secondary | ICD-10-CM

## 2015-11-14 LAB — POCT URINALYSIS DIPSTICK
Ketones, UA: NEGATIVE
LEUKOCYTES UA: NEGATIVE
NITRITE UA: NEGATIVE
PH UA: 5
Protein, UA: NEGATIVE
RBC UA: NEGATIVE
Spec Grav, UA: <=1.005
UROBILINOGEN UA: NEGATIVE

## 2015-11-14 NOTE — Progress Notes (Signed)
Patient ID: Margaret Lawson, female   DOB: [birth year], 35 y.o.   MRN: [id number] History:  35 y.o. G0P0000 here today for eval for bladder spasms at the end of her urine stream.  Pt denies dysuria or urinary freq.  She reports pink urine x 1.  She reports improved sx since starting Kegel's and walking.   The following portions of the patient's history were reviewed and updated as appropriate: allergies, current medications, past family history, past medical history, past social history, past surgical history and problem list.  Review of Systems:  Pertinent items are noted in HPI.  Objective:  Physical Exam Blood pressure 127/75, pulse 65, height 5\' 9"  (1.753 m), weight 249 lb (112.946 kg). Gen: NAD Abd: Soft, nontender and nondistended Pelvic: Normal appearing external genitalia; normal appearing vaginal mucosa and cervix.  Normal discharge.  Small uterus, no other palpable masses, no uterine or adnexal tenderness  Labs:  UA: no blood; neg nitrates; neg leuk   Assessment & Plan:  Bladder spasms  Pt also worried about upcoming oophorectomy   She is not worried about the surgery but her post menopausal state and treatment.   Spent 15 min in face to face discussion.    Cont Kegel exercise Cont walking program  Yerachmiel Spinney L. Harraway-Smith, M.D., Cherlynn June

## 2015-11-14 NOTE — Patient Instructions (Signed)
Kegel Exercises  The goal of Kegel exercises is to isolate and exercise your pelvic floor muscles. These muscles act as a hammock that supports the rectum, vagina, small intestine, and uterus. As the muscles weaken, the hammock sags and these organs are displaced from their normal positions. Kegel exercises can strengthen your pelvic floor muscles and help you to improve bladder and bowel control, improve sexual response, and help reduce many problems and some discomfort during pregnancy. Kegel exercises can be done anywhere and at any time.  HOW TO PERFORM KEGEL EXERCISES  1. Locate your pelvic floor muscles. To do this, squeeze (contract) the muscles that you use when you try to stop the flow of urine. You will feel a tightness in the vaginal area (women) and a tight lift in the rectal area (men and women).  2. When you begin, contract your pelvic muscles tight for 2-5 seconds, then relax them for 2-5 seconds. This is one set. Do 4-5 sets with a short pause in between.  3. Contract your pelvic muscles for 8-10 seconds, then relax them for 8-10 seconds. Do 4-5 sets. If you cannot contract your pelvic muscles for 8-10 seconds, try 5-7 seconds and work your way up to 8-10 seconds. Your goal is 4-5 sets of 10 contractions each day.  Keep your stomach, buttocks, and legs relaxed during the exercises. Perform sets of both short and long contractions. Vary your positions. Perform these contractions 3-4 times per day. Perform sets while you are:    · Lying in bed in the morning.  · Standing at lunch.  · Sitting in the late afternoon.  · Lying in bed at night.   You should do 40-50 contractions per day. Do not perform more Kegel exercises per day than recommended. Overexercising can cause muscle fatigue. Continue these exercises for for at least 15-20 weeks or as directed by your caregiver.     This information is not intended to replace advice given to you by your health care provider. Make sure you discuss any questions  you have with your health care provider.     Document Released: 06/29/2012 Document Revised: 08/03/2014 Document Reviewed: 06/29/2012  Elsevier Interactive Patient Education ©2016 [employer] Inc[employer]

## 2015-11-18 NOTE — Patient Instructions (Signed)
Your procedure is scheduled on:  Tuesday, Dec 10, 2015  Enter through the Main Entrance of [facility] at:  11:30 AM  Pick up the phone at the desk and dial [phone].  Call this number if you have problems the morning of surgery: [phone].  Remember:  Do NOT eat food:  After Midnight Monday, Dec 09, 2015  Do NOT drink clear liquids after:  9:00 AM day of surgery  Take these medicines the morning of surgery with a SIP OF WATER:  Atenolol, Triamterene, Gabapentin, Protonix, Potassium  Bring Asthma Inhaler day of surgery.  Do NOT wear jewelry (body piercing), metal hair clips/bobby pins, make-up, or nail polish. Do NOT wear lotions, powders, or perfumes.  You may wear deodorant. Do NOT shave for 48 hours prior to surgery. Do NOT bring valuables to the hospital. Contacts, dentures, or bridgework may not be worn into surgery.  Have a responsible adult drive you home and stay with you for 24 hours after your procedure

## 2015-11-19 ENCOUNTER — Encounter (HOSPITAL_COMMUNITY): Payer: Self-pay

## 2015-11-19 ENCOUNTER — Encounter (HOSPITAL_COMMUNITY)
Admission: RE | Admit: 2015-11-19 | Discharge: 2015-11-19 | Disposition: A | Discharge status: Home / Self Care | Payer: BLUE CROSS/BLUE SHIELD | Source: Ambulatory Visit | Attending: Obstetrics & Gynecology | Admitting: Obstetrics & Gynecology

## 2015-11-19 DIAGNOSIS — N83202 Unspecified ovarian cyst, left side: Secondary | ICD-10-CM | POA: Insufficient documentation

## 2015-11-19 DIAGNOSIS — J45909 Unspecified asthma, uncomplicated: Secondary | ICD-10-CM | POA: Diagnosis not present

## 2015-11-19 DIAGNOSIS — I10 Essential (primary) hypertension: Secondary | ICD-10-CM | POA: Insufficient documentation

## 2015-11-19 DIAGNOSIS — K219 Gastro-esophageal reflux disease without esophagitis: Secondary | ICD-10-CM | POA: Diagnosis not present

## 2015-11-19 DIAGNOSIS — Z01812 Encounter for preprocedural laboratory examination: Secondary | ICD-10-CM | POA: Diagnosis not present

## 2015-11-19 DIAGNOSIS — M797 Fibromyalgia: Secondary | ICD-10-CM | POA: Diagnosis not present

## 2015-11-19 DIAGNOSIS — F319 Bipolar disorder, unspecified: Secondary | ICD-10-CM | POA: Diagnosis not present

## 2015-11-19 DIAGNOSIS — F419 Anxiety disorder, unspecified: Secondary | ICD-10-CM | POA: Insufficient documentation

## 2015-11-19 LAB — CBC
HEMATOCRIT: 43 % (ref 36.0–46.0)
Hemoglobin: 14.7 g/dL (ref 12.0–15.0)
MCH: 30.4 pg (ref 26.0–34.0)
MCHC: 34.2 g/dL (ref 30.0–36.0)
MCV: 89 fL (ref 78.0–100.0)
PLATELETS: 253 10*3/uL (ref 150–400)
RBC: 4.83 MIL/uL (ref 3.87–5.11)
RDW: 14.1 % (ref 11.5–15.5)
WBC: 11.5 10*3/uL — ABNORMAL HIGH (ref 4.0–10.5)

## 2015-11-19 LAB — BASIC METABOLIC PANEL
Anion gap: 10 (ref 5–15)
BUN: 13 mg/dL (ref 6–20)
CALCIUM: 9.8 mg/dL (ref 8.9–10.3)
CO2: 25 mmol/L (ref 22–32)
CREATININE: 0.89 mg/dL (ref 0.44–1.00)
Chloride: 104 mmol/L (ref 101–111)
GLUCOSE: 101 mg/dL — AB (ref 65–99)
Potassium: 4.2 mmol/L (ref 3.5–5.1)
Sodium: 139 mmol/L (ref 135–145)

## 2015-12-04 ENCOUNTER — Ambulatory Visit (INDEPENDENT_AMBULATORY_CARE_PROVIDER_SITE_OTHER): Payer: BLUE CROSS/BLUE SHIELD | Admitting: Family Medicine

## 2015-12-04 ENCOUNTER — Encounter: Payer: Self-pay | Admitting: Family Medicine

## 2015-12-04 VITALS — BP 133/87 | HR 111 | Temp 98.3°F | Wt 237.0 lb

## 2015-12-04 DIAGNOSIS — J329 Chronic sinusitis, unspecified: Secondary | ICD-10-CM | POA: Diagnosis not present

## 2015-12-04 DIAGNOSIS — A499 Bacterial infection, unspecified: Secondary | ICD-10-CM

## 2015-12-04 DIAGNOSIS — B9689 Other specified bacterial agents as the cause of diseases classified elsewhere: Secondary | ICD-10-CM

## 2015-12-04 DIAGNOSIS — B001 Herpesviral vesicular dermatitis: Secondary | ICD-10-CM

## 2015-12-04 MED ORDER — CEFDINIR 300 MG PO CAPS: 300.0000 mg | ORAL_CAPSULE | Freq: Two times a day (BID) | ORAL | Status: AC

## 2015-12-04 MED ORDER — TRIAMCINOLONE ACETONIDE 0.1 % MT PSTE: PASTE | OROMUCOSAL | Status: DC

## 2015-12-04 NOTE — Progress Notes (Signed)
CC: Margaret Lawson is a 35 y.o. female is here for Sinusitis   Subjective: HPI:  Facial pressure, ear fullness on the right. Tenderness of the cheeks. Accompanied By subjective fevers and chills. She developed a nonproductive cough last night. The symptoms overall have been present ever since the middle of last week. No benefit from cough and cold medication, Nasacort, and rest. Symptoms seem to slowly be worsening. She denies any wheezing, blood in sputum, no chest discomfort.   Review Of Systems Outlined In HPI  Past Medical History  Diagnosis Date  . Asthma, chronic 12/27/2013  . Essential hypertension, benign 12/27/2013  . Fibromyalgia 12/27/2013  . Leukocytosis 12/27/2013  . Perforation of left tympanic membrane 12/27/2013  . PCOS (polycystic ovarian syndrome)   . Bipolar 2 disorder ([place])   . Anxiety     severe  . GERD (gastroesophageal reflux disease)   . Headache     migraines, over stimulation  . Right leg pain   . Restless leg syndrome   . Low iron   . Teeth clenching   . Teeth grinding   . Poor dentition     right side upper and lower  . Weakness     when exposed to hot or cold temperatures    Past Surgical History  Procedure Laterality Date  . Cholecystectomy    . Wisdom tooth extraction    . Robotic assisted total hysterectomy with bilateral salpingo oopherectomy Bilateral 09/10/2015    Procedure: ROBOTIC ASSISTED TOTAL HYSTERECTOMY WITH BILATERAL SALPINGECTOMY;  Surgeon: Lavonia Drafts, MD;  Location: [facility];  Service: Gynecology;  Laterality: Bilateral;   Family History  Problem Relation Age of Onset  . Depression Father   . Depression Sister   . Heart disease Father   . Mitral valve prolapse Father   . Aneurysm Mother     brain  . Diabetes Father   . Hypertension Father   . Breast cancer Paternal Grandfather   . Breast cancer Paternal Aunt     Social History   Social History  . Marital Status: Married    Spouse Name: N/A  . Number of  Children: N/A  . Years of Education: N/A   Occupational History  . unemployed    Social History Main Topics  . Smoking status: Current Every Day Smoker -- 1.00 packs/day for 20 years    Types: Cigarettes  . Smokeless tobacco: Never Used     Comment: uses 6mg  vapes  . Alcohol Use: No  . Drug Use: No  . Sexual Activity:    Partners: Male    Birth Control/ Protection: None   Other Topics Concern  . Not on file   Social History Narrative     Objective: BP 133/87 mmHg  Pulse 111  Temp(Src) 98.3 F (36.8 C) (Oral)  Wt 237 lb (107.502 kg)  SpO2 94%  LMP 07/28/2015  General: Alert and Oriented, No Acute Distress HEENT: Pupils equal, round, reactive to light. Conjunctivae clear.  External ears unremarkable, canals clear with intact TMs with appropriate landmarks.  Right middle ear with a serous effusion.. Pink inferior turbinates.  Moist mucous membranes, pharynx without inflammation nor lesions.  Neck supple without palpable lymphadenopathy nor abnormal masses. Lungs: Clear to auscultation bilaterally, no wheezing/ronchi/rales.  Comfortable work of breathing. Good air movement. Extremities: No peripheral edema.  Strong peripheral pulses.  Mental Status: No depression, anxiety, nor agitation. Skin: Warm and dry. Cold sore in the corner of the right lips  Assessment & Plan: Madalena was  seen today for sinusitis.  Diagnoses and all orders for this visit:  Bacterial sinusitis -     cefdinir (OMNICEF) 300 MG capsule; Take 1 capsule (300 mg total) by mouth 2 (two) times daily.  Cold sore -     triamcinolone (KENALOG) 0.1 % paste; Apply to cold sore twice a day for up to two weeks.   Bacterial sinusitis: Start Omnicef and consider nasal saline washes and Afrin. Call on Friday if no signs of improvement  Return if symptoms worsen or fail to improve.

## 2015-12-10 ENCOUNTER — Ambulatory Visit (HOSPITAL_COMMUNITY): Payer: BLUE CROSS/BLUE SHIELD | Admitting: Registered Nurse

## 2015-12-10 ENCOUNTER — Ambulatory Visit (HOSPITAL_COMMUNITY)
Admission: RE | Admit: 2015-12-10 | Discharge: 2015-12-10 | Disposition: A | Discharge status: Home / Self Care | Payer: BLUE CROSS/BLUE SHIELD | Source: Ambulatory Visit | Attending: Obstetrics & Gynecology | Admitting: Obstetrics & Gynecology

## 2015-12-10 ENCOUNTER — Encounter (HOSPITAL_COMMUNITY)
Admission: RE | Disposition: A | Discharge status: Home / Self Care | Payer: Self-pay | Source: Ambulatory Visit | Attending: Obstetrics & Gynecology

## 2015-12-10 ENCOUNTER — Encounter (HOSPITAL_COMMUNITY): Payer: Self-pay | Admitting: Emergency Medicine

## 2015-12-10 DIAGNOSIS — I10 Essential (primary) hypertension: Secondary | ICD-10-CM | POA: Diagnosis not present

## 2015-12-10 DIAGNOSIS — F419 Anxiety disorder, unspecified: Secondary | ICD-10-CM | POA: Diagnosis not present

## 2015-12-10 DIAGNOSIS — K219 Gastro-esophageal reflux disease without esophagitis: Secondary | ICD-10-CM | POA: Diagnosis not present

## 2015-12-10 DIAGNOSIS — E282 Polycystic ovarian syndrome: Secondary | ICD-10-CM | POA: Insufficient documentation

## 2015-12-10 DIAGNOSIS — F17210 Nicotine dependence, cigarettes, uncomplicated: Secondary | ICD-10-CM | POA: Diagnosis not present

## 2015-12-10 DIAGNOSIS — E785 Hyperlipidemia, unspecified: Secondary | ICD-10-CM | POA: Diagnosis not present

## 2015-12-10 DIAGNOSIS — Z881 Allergy status to other antibiotic agents status: Secondary | ICD-10-CM | POA: Insufficient documentation

## 2015-12-10 DIAGNOSIS — F3181 Bipolar II disorder: Secondary | ICD-10-CM | POA: Insufficient documentation

## 2015-12-10 DIAGNOSIS — N9489 Other specified conditions associated with female genital organs and menstrual cycle: Secondary | ICD-10-CM | POA: Diagnosis not present

## 2015-12-10 DIAGNOSIS — E669 Obesity, unspecified: Secondary | ICD-10-CM | POA: Insufficient documentation

## 2015-12-10 DIAGNOSIS — Z91040 Latex allergy status: Secondary | ICD-10-CM | POA: Diagnosis not present

## 2015-12-10 DIAGNOSIS — N8302 Follicular cyst of left ovary: Secondary | ICD-10-CM | POA: Diagnosis not present

## 2015-12-10 DIAGNOSIS — Z888 Allergy status to other drugs, medicaments and biological substances status: Secondary | ICD-10-CM | POA: Diagnosis not present

## 2015-12-10 DIAGNOSIS — G43909 Migraine, unspecified, not intractable, without status migrainosus: Secondary | ICD-10-CM | POA: Diagnosis not present

## 2015-12-10 DIAGNOSIS — G2581 Restless legs syndrome: Secondary | ICD-10-CM | POA: Insufficient documentation

## 2015-12-10 DIAGNOSIS — D367 Benign neoplasm of other specified sites: Secondary | ICD-10-CM | POA: Diagnosis not present

## 2015-12-10 DIAGNOSIS — Z880 Allergy status to penicillin: Secondary | ICD-10-CM | POA: Insufficient documentation

## 2015-12-10 DIAGNOSIS — B009 Herpesviral infection, unspecified: Secondary | ICD-10-CM | POA: Insufficient documentation

## 2015-12-10 DIAGNOSIS — Z885 Allergy status to narcotic agent status: Secondary | ICD-10-CM | POA: Insufficient documentation

## 2015-12-10 DIAGNOSIS — Z6835 Body mass index (BMI) 35.0-35.9, adult: Secondary | ICD-10-CM | POA: Diagnosis not present

## 2015-12-10 DIAGNOSIS — J45909 Unspecified asthma, uncomplicated: Secondary | ICD-10-CM | POA: Insufficient documentation

## 2015-12-10 DIAGNOSIS — N8301 Follicular cyst of right ovary: Secondary | ICD-10-CM | POA: Insufficient documentation

## 2015-12-10 DIAGNOSIS — M797 Fibromyalgia: Secondary | ICD-10-CM | POA: Diagnosis not present

## 2015-12-10 DIAGNOSIS — N838 Other noninflammatory disorders of ovary, fallopian tube and broad ligament: Secondary | ICD-10-CM | POA: Diagnosis present

## 2015-12-10 SURGERY — OOPHORECTOMY, LAPAROSCOPIC
Anesthesia: General | Site: Abdomen | Laterality: Bilateral

## 2015-12-10 MED ORDER — GLYCOPYRROLATE 0.2 MG/ML IJ SOLN
INTRAMUSCULAR | Status: DC | PRN
  Administered 2015-12-10: 0.2 mg via INTRAVENOUS

## 2015-12-10 MED ORDER — LACTATED RINGERS IV SOLN
INTRAVENOUS | Status: DC
  Administered 2015-12-10 (×2): via INTRAVENOUS

## 2015-12-10 MED ORDER — MIDAZOLAM HCL 5 MG/5ML IJ SOLN
INTRAMUSCULAR | Status: DC | PRN
  Administered 2015-12-10: 2 mg via INTRAVENOUS

## 2015-12-10 MED ORDER — OXYCODONE-ACETAMINOPHEN 5-325 MG PO TABS: 1.0000 | ORAL_TABLET | Freq: Four times a day (QID) | ORAL | Status: DC | PRN

## 2015-12-10 MED ORDER — FENTANYL CITRATE (PF) 100 MCG/2ML IJ SOLN
INTRAMUSCULAR | Status: AC
  Administered 2015-12-10: 25 ug via INTRAVENOUS
  Filled 2015-12-10: qty 2

## 2015-12-10 MED ORDER — SUGAMMADEX SODIUM 200 MG/2ML IV SOLN
INTRAVENOUS | Status: DC | PRN
  Administered 2015-12-10: 200 mg via INTRAVENOUS

## 2015-12-10 MED ORDER — LIDOCAINE HCL (CARDIAC) 20 MG/ML IV SOLN
INTRAVENOUS | Status: AC
  Filled 2015-12-10: qty 5

## 2015-12-10 MED ORDER — ONDANSETRON HCL 4 MG/2ML IJ SOLN
INTRAMUSCULAR | Status: DC | PRN
  Administered 2015-12-10: 4 mg via INTRAVENOUS

## 2015-12-10 MED ORDER — DEXAMETHASONE SODIUM PHOSPHATE 4 MG/ML IJ SOLN
INTRAMUSCULAR | Status: AC
Start: 2015-12-10 — End: 2015-12-10
  Filled 2015-12-10: qty 1

## 2015-12-10 MED ORDER — ESTRADIOL 0.05 MG/24HR TD PTWK: 0.0500 mg | MEDICATED_PATCH | TRANSDERMAL | Status: DC

## 2015-12-10 MED ORDER — MEPERIDINE HCL 25 MG/ML IJ SOLN
INTRAMUSCULAR | Status: AC
  Filled 2015-12-10: qty 1

## 2015-12-10 MED ORDER — SUGAMMADEX SODIUM 200 MG/2ML IV SOLN
INTRAVENOUS | Status: AC
  Filled 2015-12-10: qty 2

## 2015-12-10 MED ORDER — SCOPOLAMINE 1 MG/3DAYS TD PT72
1.0000 | MEDICATED_PATCH | Freq: Once | TRANSDERMAL | Status: DC
  Administered 2015-12-10: 1.5 mg via TRANSDERMAL

## 2015-12-10 MED ORDER — BUPIVACAINE HCL (PF) 0.5 % IJ SOLN
INTRAMUSCULAR | Status: DC | PRN
  Administered 2015-12-10: 30 mL

## 2015-12-10 MED ORDER — ESTRADIOL 0.05 MG/24HR TD PTTW: 1.0000 | MEDICATED_PATCH | TRANSDERMAL | Status: DC

## 2015-12-10 MED ORDER — DEXAMETHASONE SODIUM PHOSPHATE 4 MG/ML IJ SOLN
INTRAMUSCULAR | Status: DC | PRN
  Administered 2015-12-10: 4 mg via INTRAVENOUS

## 2015-12-10 MED ORDER — PROPOFOL 10 MG/ML IV BOLUS
INTRAVENOUS | Status: DC | PRN
  Administered 2015-12-10: 200 mg via INTRAVENOUS

## 2015-12-10 MED ORDER — BUPIVACAINE HCL (PF) 0.5 % IJ SOLN
INTRAMUSCULAR | Status: AC
  Filled 2015-12-10: qty 30

## 2015-12-10 MED ORDER — FENTANYL CITRATE (PF) 100 MCG/2ML IJ SOLN
INTRAMUSCULAR | Status: AC
  Filled 2015-12-10: qty 2

## 2015-12-10 MED ORDER — LACTATED RINGERS IV SOLN: INTRAVENOUS | Status: DC

## 2015-12-10 MED ORDER — SODIUM CHLORIDE 0.9 % IJ SOLN
INTRAMUSCULAR | Status: AC
  Filled 2015-12-10: qty 10

## 2015-12-10 MED ORDER — FENTANYL CITRATE (PF) 100 MCG/2ML IJ SOLN
25.0000 ug | INTRAMUSCULAR | Status: DC | PRN
  Administered 2015-12-10: 25 ug via INTRAVENOUS
  Administered 2015-12-10 (×2): 50 ug via INTRAVENOUS
  Administered 2015-12-10: 25 ug via INTRAVENOUS

## 2015-12-10 MED ORDER — PROPOFOL 10 MG/ML IV BOLUS
INTRAVENOUS | Status: AC
  Filled 2015-12-10: qty 20

## 2015-12-10 MED ORDER — MIDAZOLAM HCL 2 MG/2ML IJ SOLN
INTRAMUSCULAR | Status: AC
  Filled 2015-12-10: qty 2

## 2015-12-10 MED ORDER — ROCURONIUM BROMIDE 100 MG/10ML IV SOLN
INTRAVENOUS | Status: AC
  Filled 2015-12-10: qty 1

## 2015-12-10 MED ORDER — ESTRADIOL 0.05 MG/24HR TD PTWK
0.0500 mg | MEDICATED_PATCH | TRANSDERMAL | Status: DC
  Administered 2015-12-10: 0.05 mg via TRANSDERMAL
  Filled 2015-12-10: qty 1

## 2015-12-10 MED ORDER — OXYCODONE-ACETAMINOPHEN 5-325 MG PO TABS
ORAL_TABLET | ORAL | Status: AC
  Filled 2015-12-10: qty 1

## 2015-12-10 MED ORDER — GLYCOPYRROLATE 0.2 MG/ML IJ SOLN
INTRAMUSCULAR | Status: AC
  Filled 2015-12-10: qty 1

## 2015-12-10 MED ORDER — METOCLOPRAMIDE HCL 5 MG/ML IJ SOLN: 10.0000 mg | Freq: Once | INTRAMUSCULAR | Status: DC | PRN

## 2015-12-10 MED ORDER — ROCURONIUM BROMIDE 100 MG/10ML IV SOLN
INTRAVENOUS | Status: DC | PRN
  Administered 2015-12-10: 50 mg via INTRAVENOUS

## 2015-12-10 MED ORDER — FENTANYL CITRATE (PF) 250 MCG/5ML IJ SOLN
INTRAMUSCULAR | Status: AC
  Filled 2015-12-10: qty 5

## 2015-12-10 MED ORDER — SCOPOLAMINE 1 MG/3DAYS TD PT72
MEDICATED_PATCH | TRANSDERMAL | Status: AC
  Administered 2015-12-10: 1.5 mg via TRANSDERMAL
  Filled 2015-12-10: qty 1

## 2015-12-10 MED ORDER — FENTANYL CITRATE (PF) 100 MCG/2ML IJ SOLN
INTRAMUSCULAR | Status: DC | PRN
  Administered 2015-12-10: 100 ug via INTRAVENOUS
  Administered 2015-12-10 (×3): 50 ug via INTRAVENOUS

## 2015-12-10 MED ORDER — OXYCODONE-ACETAMINOPHEN 5-325 MG PO TABS
1.0000 | ORAL_TABLET | ORAL | Status: DC | PRN
  Administered 2015-12-10: 1 via ORAL

## 2015-12-10 MED ORDER — ONDANSETRON HCL 4 MG/2ML IJ SOLN
INTRAMUSCULAR | Status: AC
  Filled 2015-12-10: qty 2

## 2015-12-10 MED ORDER — LIDOCAINE 2% (20 MG/ML) 5 ML SYRINGE
INTRAMUSCULAR | Status: DC | PRN
  Administered 2015-12-10: 80 mg via INTRAVENOUS

## 2015-12-10 MED ORDER — MEPERIDINE HCL 25 MG/ML IJ SOLN
6.2500 mg | INTRAMUSCULAR | Status: DC | PRN
  Administered 2015-12-10: 12.5 mg via INTRAVENOUS

## 2015-12-10 SURGICAL SUPPLY — 30 items
CABLE HIGH FREQUENCY MONO STRZ (ELECTRODE) IMPLANT
CATH ROBINSON RED A/P 16FR (CATHETERS) IMPLANT
CHLORAPREP W/TINT 26ML (MISCELLANEOUS) ×3 IMPLANT
CLOTH BEACON ORANGE TIMEOUT ST (SAFETY) ×3 IMPLANT
DRSG COVADERM PLUS 2X2 (GAUZE/BANDAGES/DRESSINGS) ×6 IMPLANT
DRSG OPSITE POSTOP 3X4 (GAUZE/BANDAGES/DRESSINGS) ×3 IMPLANT
GLOVE BIO SURGEON STRL SZ7 (GLOVE) ×6 IMPLANT
GLOVE BIOGEL PI IND STRL 7.0 (GLOVE) ×2 IMPLANT
GLOVE BIOGEL PI INDICATOR 7.0 (GLOVE) ×4
GOWN STRL REUS W/TWL LRG LVL3 (GOWN DISPOSABLE) ×6 IMPLANT
GOWN STRL REUS W/TWL XL LVL3 (GOWN DISPOSABLE) ×3 IMPLANT
MANIPULATOR UTERINE 4.5 ZUMI (MISCELLANEOUS) ×3 IMPLANT
NEEDLE INSUFFLATION 120MM (ENDOMECHANICALS) ×3 IMPLANT
NS IRRIG 1000ML POUR BTL (IV SOLUTION) ×3 IMPLANT
PACK LAPAROSCOPY BASIN (CUSTOM PROCEDURE TRAY) ×3 IMPLANT
PAD TRENDELENBURG POSITION (MISCELLANEOUS) ×3 IMPLANT
POUCH SPECIMEN RETRIEVAL 10MM (ENDOMECHANICALS) ×3 IMPLANT
SET IRRIG TUBING LAPAROSCOPIC (IRRIGATION / IRRIGATOR) IMPLANT
SHEARS HARMONIC ACE PLUS 36CM (ENDOMECHANICALS) ×3 IMPLANT
SLEEVE XCEL OPT CAN 5 100 (ENDOMECHANICALS) IMPLANT
SUT VICRYL 0 UR6 27IN ABS (SUTURE) ×3 IMPLANT
SUT VICRYL 4-0 PS2 18IN ABS (SUTURE) ×6 IMPLANT
SYR 5ML LL (SYRINGE) ×3 IMPLANT
TOWEL OR 17X24 6PK STRL BLUE (TOWEL DISPOSABLE) ×6 IMPLANT
TRAY FOLEY CATH SILVER 14FR (SET/KITS/TRAYS/PACK) ×3 IMPLANT
TROCAR 5M 150ML BLDLS (TROCAR) ×3 IMPLANT
TROCAR OPTI TIP 5M 100M (ENDOMECHANICALS) IMPLANT
TROCAR XCEL DIL TIP R 11M (ENDOMECHANICALS) ×3 IMPLANT
TROCAR XCEL NON-BLD 5MMX100MML (ENDOMECHANICALS) ×3 IMPLANT
WARMER LAPAROSCOPE (MISCELLANEOUS) ×3 IMPLANT

## 2015-12-10 NOTE — OR Nursing (Signed)
Dressing to right lower abdomen with moderate blood drainage. Dr. Cecille Po comes to assess site. She removes old dressing and applies new steri strips and pressure dressing and tapes securely. Darin Engels rn

## 2015-12-10 NOTE — Anesthesia Procedure Notes (Signed)
Procedure Name: Intubation Date/Time: 12/10/2015 12:20 PM Performed by: Talbot Grumbling Pre-anesthesia Checklist: Patient identified, Emergency Drugs available, Suction available and Patient being monitored Patient Re-evaluated:Patient Re-evaluated prior to inductionOxygen Delivery Method: Circle system utilized Preoxygenation: Pre-oxygenation with 100% oxygen Intubation Type: IV induction Ventilation: Mask ventilation without difficulty Laryngoscope Size: Miller and 2 Grade View: Grade I Tube type: Oral Tube size: 7.0 mm Number of attempts: 1 Airway Equipment and Method: Stylet Placement Confirmation: ETT inserted through vocal cords under direct vision,  positive ETCO2 and breath sounds checked- equal and bilateral Secured at: 21 cm Tube secured with: Tape Dental Injury: Teeth and Oropharynx as per pre-operative assessment

## 2015-12-10 NOTE — Anesthesia Preprocedure Evaluation (Signed)
Anesthesia Evaluation  Patient identified by MRN, date of birth, ID band Patient awake    Reviewed: Allergy & Precautions, H&P , NPO status , Patient's Chart, lab work & pertinent test results  Airway Mallampati: I  TM Distance: >3 FB Neck ROM: full    Dental no notable dental hx. (+) Teeth Intact   Pulmonary Current Smoker,    + rhonchi        Cardiovascular hypertension, Pt. on medications negative cardio ROS Normal cardiovascular exam     Neuro/Psych    GI/Hepatic Neg liver ROS, GERD  Medicated and Controlled,  Endo/Other  negative endocrine ROS  Renal/GU negative Renal ROS     Musculoskeletal   Abdominal (+) + obese,   Peds  Hematology negative hematology ROS (+)   Anesthesia Other Findings   Reproductive/Obstetrics negative OB ROS                             Anesthesia Physical  Anesthesia Plan  ASA: III  Anesthesia Plan: General   Post-op Pain Management:    Induction: Intravenous  Airway Management Planned: Oral ETT  Additional Equipment:   Intra-op Plan:   Post-operative Plan: Extubation in OR  Informed Consent: I have reviewed the patients History and Physical, chart, labs and discussed the procedure including the risks, benefits and alternatives for the proposed anesthesia with the patient or authorized representative who has indicated his/her understanding and acceptance.   Dental advisory given  Plan Discussed with: CRNA and Surgeon  Anesthesia Plan Comments:         Anesthesia Quick Evaluation

## 2015-12-10 NOTE — Transfer of Care (Signed)
Immediate Anesthesia Transfer of Care Note  Patient: Margaret Lawson  Procedure(s) Performed: Procedure(s): LAPAROSCOPIC BILATERAL OOPHORECTOMY (Bilateral)  Patient Location: PACU  Anesthesia Type:General  Level of Consciousness: awake, alert  and oriented  Airway & Oxygen Therapy: Patient Spontanous Breathing and Patient connected to nasal cannula oxygen  Post-op Assessment: Report given to RN  Post vital signs: Reviewed  Last Vitals:  Filed Vitals:   12/10/15 1124  BP: 137/79  Pulse: 62  Temp: 36.8 C  Resp: 16    Last Pain: There were no vitals filed for this visit.    Patients Stated Pain Goal: 5 ([id number])  Complications: No apparent anesthesia complications

## 2015-12-10 NOTE — Op Note (Signed)
12/10/2015  1:36 PM  PATIENT:  Margaret Lawson  35 y.o. 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  PROCEDURE:  Procedure(s): LAPAROSCOPIC BILATERAL OOPHORECTOMY (Bilateral)  SURGEON:  Surgeon(s) and Role:    * Lavonia Drafts, MD - Primary    * Osborne Oman, MD - Assisting  ANESTHESIA:   general  EBL:  Total I/O In: 1000 [I.V.:1000] Out: 175 [Urine:150; Blood:25]  BLOOD ADMINISTERED:none  DRAINS: none   LOCAL MEDICATIONS USED:  MARCAINE     SPECIMEN:  Source of Specimen:  bilateral ovaries  DISPOSITION OF SPECIMEN:  PATHOLOGY  COUNTS:  YES  TOURNIQUET:  * No tourniquets in log *  DICTATION: .Note written in EPIC  PLAN OF CARE: Discharge to home after PACU  PATIENT DISPOSITION:  PACU - hemodynamically stable.   Delay start of Pharmacological VTE agent (>24hrs) due to surgical blood loss or risk of bleeding: not applicable  Complications : none immediate  Findings: surgically absent uterus.  Some filmy adhesion of bowel in the cul de sac.  Bilateral ovaries normal in appearance.  PROCEDURE IN DETAIL:  The patient had sequential compression devices applied to her lower extremities while in the preoperative area.  She was then taken to the operating room where general anesthesia was administered and was found to be adequate.  She was placed in the dorsal lithotomy position, and was prepped and draped in a sterile manner.  A Foley catheter was inserted into her bladder and attached to constant drainage and a uterine manipulator was then advanced into the uterus .  After an adequate timeout was performed, attention was then turned to the patient's abdomen where a 59mm skin incision was made in the umbilical fold.  An Optiview trocar was carefully introduced into the peritoneal cavity through the abdominal wall there was not good  visualization so the trocar was removed.  A 2mm trocar with camera was then placed in the left upper quadrant and good visualization was noted.  A pneumoperitoneum was obtained using CO2 gas.  A 11-mm trocar and sleeve were then advanced without difficulty into the abdomen in th eright lower quadrant and a 22mm trocar was placed in the left lower quadrant both under direct visualization. A survey of the patient's pelvis and abdomen revealed the findings above.  The right then left infundibulopelvic ligament was clamped and transected allowing for bilateral oophorectomy.  Excellent hemostasis was noted. The specimens were removed from the abdomen through the 11-mm port using an Endocatch bag, under direct visualization.  The operative site was surveyed, and it was found to be hemostatic.  No intraoperative injury to other surrounding organs was noted.  The right lower quadrant trocar was removed and the fascia was closed with 0 vicryl under direct visualization.  The abdomen was desufflated and all instruments were then removed from the patient's abdomen. The skin of the right lower port site was closed with 3-0 vicryl in a subcuticular stitches.  The other port sites were closed using Dermabond.   The patient will be discharged to home as per PACU criteria.  Routine postoperative instructions given.  She was prescribed Percocet and a Climara patch was placed prior to her discharge from the PACU.   She will follow up in the office in 2 weeks for postoperative evaluation.  Calah Gershman L. Harraway-Smith, M.D., Cherlynn June

## 2015-12-10 NOTE — H&P (Signed)
Preoperative History and Physical  Margaret Lawson is a 35 y.o. G0P0000 here for surgical management of serous borderline tumor.  Proposed surgery:  bilateral ophorectomy.   Past Medical History  Diagnosis Date  . Asthma, chronic 12/27/2013  . Essential hypertension, benign 12/27/2013  . Fibromyalgia 12/27/2013  . Leukocytosis 12/27/2013  . Perforation of left tympanic membrane 12/27/2013  . PCOS (polycystic ovarian syndrome)   . Bipolar 2 disorder ([place])   . Anxiety     severe  . GERD (gastroesophageal reflux disease)   . Headache     migraines, over stimulation  . Right leg pain   . Restless leg syndrome   . Low iron   . Teeth clenching   . Teeth grinding   . Poor dentition     right side upper and lower  . Weakness     when exposed to hot or cold temperatures   Past Surgical History  Procedure Laterality Date  . Cholecystectomy    . Wisdom tooth extraction    . Robotic assisted total hysterectomy with bilateral salpingo oopherectomy Bilateral 09/10/2015    Procedure: ROBOTIC ASSISTED TOTAL HYSTERECTOMY WITH BILATERAL SALPINGECTOMY;  Surgeon: Lavonia Drafts, MD;  Location: [facility];  Service: Gynecology;  Laterality: Bilateral;   OB History    Gravida Para Term Preterm AB TAB SAB Ectopic Multiple Living   0 0 0 0 0 0 0 0 0 0      Patient denies any cervical dysplasia or STIs. Prescriptions prior to admission  Medication Sig Dispense Refill Last Dose  . acetaminophen (TYLENOL) 500 MG tablet Take 500 mg by mouth every 6 (six) hours as needed for mild pain or moderate pain.   Taking  . albuterol (PROVENTIL HFA;VENTOLIN HFA) 108 (90 Base) MCG/ACT inhaler Inhale 2 puffs into the lungs every 4 (four) hours as needed for wheezing. 18 g 4 12/09/2015 at Unknown time  . atenolol (TENORMIN) 100 MG tablet Take 1 tablet (100 mg total) by mouth daily. 90 tablet 3 12/10/2015 at Unknown time  . cefdinir (OMNICEF) 300 MG capsule Take 1 capsule (300 mg total) by mouth 2 (two) times daily.  20 capsule 0 12/10/2015 at Unknown time  . cetirizine (ZYRTEC) 10 MG tablet Take 10 mg by mouth daily.   Taking  . gabapentin (NEURONTIN) 600 MG tablet TAKE 1 TABLET BY MOUTH THREE TIMES DAILY 270 tablet 0 12/10/2015 at Unknown time  . ipratropium-albuterol (DUONEB) 0.5-2.5 (3) MG/3ML SOLN Take 3 mLs by nebulization every 4 (four) hours as needed (wheezing/cough/sob). 360 mL 1 Past Week at Unknown time  . pantoprazole (PROTONIX) 40 MG tablet TAKE 1 TABLET BY MOUTH DAILY 90 tablet 0 12/10/2015 at Unknown time  . potassium chloride (KLOR-CON) 20 MEQ packet Take 10 mEq by mouth 2 (two) times daily. 50 packet 2 12/10/2015 at Unknown time  . tiZANidine (ZANAFLEX) 4 MG tablet TAKE 1/2- 1 TABLET BY MOUTH EVERY 6 HOURS AS NEEDED FOR MUSCLE SPASMS 120 tablet 0 12/09/2015 at Unknown time  . triamterene-hydrochlorothiazide (MAXZIDE-25) 37.5-25 MG tablet Take 1 tablet by mouth daily. 90 tablet 3 12/10/2015 at Unknown time  . albuterol (PROVENTIL) (2.5 MG/3ML) 0.083% nebulizer solution Take 3 mLs (2.5 mg total) by nebulization every 4 (four) hours as needed for wheezing. 75 mL 0 Taking  . dextromethorphan-guaiFENesin (MUCINEX DM) 30-600 MG 12hr tablet Take 1 tablet by mouth 2 (two) times daily as needed for cough.   Taking  . Multiple Vitamin (MULTIVITAMIN) capsule Take 1 capsule by mouth daily.   Taking  .  promethazine (PHENERGAN) 25 MG tablet Take 1 tablet (25 mg total) by mouth every 6 (six) hours as needed for nausea or vomiting. 30 tablet 0 Taking  . triamcinolone (KENALOG) 0.1 % paste Apply to cold sore twice a day for up to two weeks. 5 g 12   . valACYclovir (VALTREX) 500 MG tablet Take 1 tablet (500 mg total) by mouth 2 (two) times daily. (Patient taking differently: Take 500 mg by mouth daily. ) 10 tablet 0 Taking    Allergies  Allergen Reactions  . Adhesive [Tape] Other (See Comments)    Blisters, paper tape is worse  . Celecoxib Nausea And Vomiting  . Doxycycline     Double vision   . Erythromycin  Nausea And Vomiting  . Lyrica [Pregabalin]     Induced lactation  . Morphine And Related     Decrease bp  . Penicillins Hives    Has patient had a PCN reaction causing immediate rash, facial/tongue/throat swelling, SOB or lightheadedness with hypotension: Yes Has patient had a PCN reaction causing severe rash involving mucus membranes or skin necrosis: No Has patient had a PCN reaction that required hospitalization No Has patient had a PCN reaction occurring within the last 10 years: Yes If all of the above answers are "NO", then may proceed with Cephalosporin use.   . Latex Rash   Social History:   reports that she has been smoking Cigarettes.  She has a 20 pack-year smoking history. She has never used smokeless tobacco. She reports that she does not drink alcohol or use illicit drugs. Family History  Problem Relation Age of Onset  . Depression Father   . Depression Sister   . Heart disease Father   . Mitral valve prolapse Father   . Aneurysm Mother     brain  . Diabetes Father   . Hypertension Father   . Breast cancer Paternal Grandfather   . Breast cancer Paternal Aunt     Review of Systems: Noncontributory  PHYSICAL EXAM: Blood pressure 137/79, pulse 62, temperature 98.3 F (36.8 C), temperature source Oral, resp. rate 16, SpO2 99 %. General appearance - alert, well appearing, and in no distress Chest - clear to auscultation, no wheezes, rales or rhonchi, symmetric air entry Heart - normal rate and regular rhythm Abdomen - soft, nontender, nondistended, no masses or organomegaly Pelvic - examination not indicated Extremities - peripheral pulses normal, no pedal edema, no clubbing or cyanosis  Labs: 09/10/2015 Diagnosis Uterus and bilateral fallopian tubes, cervix, cyst on left tube, mass from right tube - UTERUS: -ENDOMETRIUM: SIMPLE AND COMPLEX HYPERPLASIA WITHOUT ATYPIA. NO CARCINOMA. -MYOMETRIUM: UNREMARKABLE. NO MALIGNANCY. -SEROSA: UNREMARKABLE. NO  MALIGNANCY. - CERVIX: BENIGN SQUAMOUS AND ENDOCERVICAL MUCOSA. NO DYSPLASIA OR MALIGNANCY. - RIGHT FALLOPIAN TUBE: SEROUS CYSTADENOFIBROMA. NO CARCINOMA. - LEFT FALLOPIAN TUBE: SEROUS BORDERLINE TUMOR, 2.3 CM. NO INVASIVE CARCINOMA. SEE COMMENT  CBC    Component Value Date/Time   WBC 11.5* 11/19/2015 1035   WBC 13.1* 08/21/2015 1347   RBC 4.83 11/19/2015 1035   RBC 5.32 08/21/2015 1347   HGB 14.7 11/19/2015 1035   HGB 16.2* 08/21/2015 1347   HCT 43.0 11/19/2015 1035   HCT 45.7 08/21/2015 1347   PLT 253 11/19/2015 1035   PLT 241 08/21/2015 1347   MCV 89.0 11/19/2015 1035   MCV 86 08/21/2015 1347   MCH 30.4 11/19/2015 1035   MCH 30.5 08/21/2015 1347   MCHC 34.2 11/19/2015 1035   MCHC 35.4 08/21/2015 1347   RDW 14.1 11/19/2015 1035  RDW 13.7 08/21/2015 1347   LYMPHSABS 4.7* 08/21/2015 1347   LYMPHSABS 4.4* 06/25/2015 1447   MONOABS 0.9 06/25/2015 1447   EOSABS 0.1 08/21/2015 1347   EOSABS 0.1 06/25/2015 1447   BASOSABS 0.1 08/21/2015 1347   BASOSABS 0.0 06/25/2015 1447     Imaging Studies: No results found.  Assessment: Patient Active Problem List   Diagnosis Date Noted  . Hypokalemia 09/27/2015  . Generalized anxiety disorder 09/10/2015  . Abnormal uterine bleeding (AUB) 09/10/2015  . Post-operative state 09/10/2015  . Ovarian cyst, right 07/15/2015  . Amenorrhea 07/15/2015  . 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 Other migraine without status migrainosus, not intractable 03/07/2015  . Bipolar I disorder, most recent episode depressed ([place]) 10/11/2014  . Hyperlipidemia 01/03/2014  . History of nephrolithiasis 12/27/2013  . Asthma, chronic 12/27/2013  . Essential hypertension, benign 12/27/2013  . Fibromyalgia 12/27/2013  . Perforation of left tympanic membrane 12/27/2013  . Muscle spasm 12/27/2013  . GERD (gastroesophageal reflux disease) 12/27/2013  . Leukocytosis 12/27/2013  . History of cholecystectomy 09/11/2010    Plan: Patient  will undergo surgical management with bilateral ophorectomy.   The risks of surgery were discussed in detail with the patient including but not limited to: bleeding which may require transfusion or reoperation; infection which may require antibiotics; injury to surrounding organs which may involve bowel, bladder, ureters ; need for additional procedures including laparoscopy or laparotomy; thromboembolic phenomenon, surgical site problems and other postoperative/anesthesia complications. Likelihood of success in alleviating the patient's condition was discussed. Routine postoperative instructions will be reviewed with the patient and her family in detail after surgery.  The patient concurred with the proposed plan, giving informed written consent for the surgery.  Patient has been NPO since last night she will remain NPO for procedure.  Anesthesia and OR aware.  Preoperative prophylactic antibiotics and SCDs ordered on call to the OR.  To OR when ready.  Jaliana Medellin L. Harraway-Smith, M.D., [place] 12/10/2015 12:00 PM

## 2015-12-10 NOTE — Discharge Instructions (Signed)
Bilateral Salpingo-Oophorectomy, Care After Refer to this sheet in the next few weeks. These instructions provide you with information on caring for yourself after your procedure. Your health care provider may also give you more specific instructions. Your treatment has been planned according to current medical practices, but problems sometimes occur. Call your health care provider if you have any problems or questions after your procedure. WHAT TO EXPECT AFTER THE PROCEDURE After your procedure, it is typical to have the following:   Abdominal pain that can be controlled with medicine.  Vaginal spotting.  Constipation.  Menopausal symptoms such as hot flashes, vaginal dryness, and mood swings. HOME CARE INSTRUCTIONS   Get plenty of rest and sleep.  Only take over-the-counter or prescription medicines as directed by your health care provider. Do not take aspirin. It can cause bleeding.  Keep incision areas clean and dry. Remove or change bandages (dressings) only as directed by your health care provider.  Take showers instead of baths for a few weeks as directed by your health care provider.  Limit exercise and activities as directed by your health care provider. Do not lift anything heavier than 5 pounds (2.3 kg) until your health care provider approves.  Do not drive until your health care provider approves.  Follow your health care provider's advice regarding diet. You may be able to resume your usual diet right away.  Drink enough fluids to keep your urine clear or pale yellow.  Do not douche, use tampons, or have sexual intercourse for 6 weeks after the procedure.  Do not drink alcohol until your health care provider says it is okay.  Take your temperature twice a day and write it down.  If you become constipated, you may:  Ask your health care provider about taking a mild laxative.  Add more fruit and bran to your diet.  Drink more fluids.  Follow up with your health  care provider as directed. SEEK MEDICAL CARE IF:   You have swelling, redness, or increasing pain in the incision area.  You see pus coming from the incision area.  You notice a bad smell coming from the wound or dressing.  You have pain, redness, or swelling where the IV access tube was placed.  Your incision is breaking open (the edges are not staying together).  You feel dizzy or feel like fainting.  You develop pain or bleeding when you urinate.  You develop diarrhea.  You develop nausea and vomiting.  You develop abnormal vaginal discharge.  You develop a rash.  You have pain that is not controlled with medicine. SEEK IMMEDIATE MEDICAL CARE IF:   You develop a fever.  You develop abdominal pain.  You have chest pain.  You develop shortness of breath.  You pass out.  You develop pain, swelling, or redness in your leg.  You develop heavy vaginal bleeding with or without blood clots.   This information is not intended to replace advice given to you by your health care provider. Make sure you discuss any questions you have with your health care provider.   Document Released: 07/13/2005 Document Revised: 03/15/2013 Document Reviewed: 01/04/2013 Elsevier Interactive Patient Education 2016 [place] INSTRUCTIONS: Laparoscopy  The following instructions have been prepared to help you care for yourself upon your return home today.  Wound care:  Do not get the incision wet for the first 24 hours. The incision should be kept clean and dry.  The Band-Aids or dressings may be removed the day after  surgery.  Should the incision become sore, red, and swollen after the first week, check with your doctor.  Personal hygiene:  Shower the day after your procedure.  Activity and limitations:  Do NOT drive or operate any equipment today.  Do NOT lift anything more than 15 pounds for 2-3 weeks after surgery.  Do NOT rest in bed all day.  Walking is  encouraged. Walk each day, starting slowly with 5-minute walks 3 or 4 times a day. Slowly increase the length of your walks.  Walk up and down stairs slowly.  Do NOT do strenuous activities, such as golfing, playing tennis, bowling, running, biking, weight lifting, gardening, mowing, or vacuuming for 2-4 weeks. Ask your doctor when it is okay to start.  Diet: Eat a light meal as desired this evening. You may resume your usual diet tomorrow.  Return to work: This is dependent on the type of work you do. For the most part you can return to a desk job within a week of surgery. If you are more active at work, please discuss this with your doctor.  What to expect after your surgery: You may have a slight burning sensation when you urinate on the first day. You may have a very small amount of blood in the urine. Expect to have a small amount of vaginal discharge/light bleeding for 1-2 weeks. It is not unusual to have abdominal soreness and bruising for up to 2 weeks. You may be tired and need more rest for about 1 week. You may experience shoulder pain for 24-72 hours. Lying flat in bed may relieve it.  Call your doctor for any of the following:  Develop a fever of 100.4 or greater  Inability to urinate 6 hours after discharge from hospital  Severe pain not relieved by pain medications  Persistent of heavy bleeding at incision site  Redness or swelling around incision site after a week  Increasing nausea or vomiting  Patient Signature________________________________________ Nurse Signature_________________________________________

## 2015-12-10 NOTE — Brief Op Note (Signed)
12/10/2015  1:36 PM  PATIENT:  Margaret Lawson  35 y.o. 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  PROCEDURE:  Procedure(s): LAPAROSCOPIC BILATERAL OOPHORECTOMY (Bilateral)  SURGEON:  Surgeon(s) and Role:    * Lavonia Drafts, MD - Primary    * Osborne Oman, MD - Assisting  ANESTHESIA:   general  EBL:  Total I/O In: 1000 [I.V.:1000] Out: 175 [Urine:150; Blood:25]  BLOOD ADMINISTERED:none  DRAINS: none   LOCAL MEDICATIONS USED:  MARCAINE     SPECIMEN:  Source of Specimen:  bilateral ovaries  DISPOSITION OF SPECIMEN:  PATHOLOGY  COUNTS:  YES  TOURNIQUET:  * No tourniquets in log *  DICTATION: .Note written in EPIC  PLAN OF CARE: Discharge to home after PACU  PATIENT DISPOSITION:  PACU - hemodynamically stable.   Delay start of Pharmacological VTE agent (>24hrs) due to surgical blood loss or risk of bleeding: not applicable  Complications : none immediate  Margaret Lawson Margaret Lawson, M.D., Margaret Lawson

## 2015-12-11 NOTE — Anesthesia Postprocedure Evaluation (Signed)
Anesthesia Post Note  Patient: Margaret Lawson  Procedure(s) Performed: Procedure(s) (LRB): LAPAROSCOPIC BILATERAL OOPHORECTOMY (Bilateral)  Patient location during evaluation: PACU Anesthesia Type: General Level of consciousness: awake and alert Pain management: pain level controlled Vital Signs Assessment: post-procedure vital signs reviewed and stable Respiratory status: spontaneous breathing, nonlabored ventilation, respiratory function stable and patient connected to nasal cannula oxygen Cardiovascular status: blood pressure returned to baseline and stable Postop Assessment: no signs of nausea or vomiting Anesthetic complications: no     Last Vitals:  Filed Vitals:   12/10/15 1600 12/10/15 1650  BP:  120/65  Pulse: 69 58  Temp:  36.8 C  Resp: 14 18    Last Pain:  Filed Vitals:   12/11/15 1231  PainSc: 3    Pain Goal: Patients Stated Pain Goal: 5 (12/10/15 1415)               Montez Hageman

## 2015-12-12 ENCOUNTER — Telehealth: Payer: Self-pay

## 2015-12-12 NOTE — Telephone Encounter (Signed)
-----   Message from Lavonia Drafts, MD sent at 12/11/2015  4:56 PM EDT ----- Please call pt and notify that her surg path was normal.  clh-S

## 2015-12-12 NOTE — Telephone Encounter (Signed)
Left message for patient to return call to office for pathology results. Kathrene Alu RN BSN

## 2015-12-16 ENCOUNTER — Other Ambulatory Visit: Payer: Self-pay | Admitting: Family Medicine

## 2015-12-17 ENCOUNTER — Ambulatory Visit (INDEPENDENT_AMBULATORY_CARE_PROVIDER_SITE_OTHER): Payer: BLUE CROSS/BLUE SHIELD | Admitting: Physician Assistant

## 2015-12-17 ENCOUNTER — Encounter: Payer: Self-pay | Admitting: Physician Assistant

## 2015-12-17 VITALS — BP 143/66 | HR 68 | Ht 69.0 in | Wt 238.0 lb

## 2015-12-17 DIAGNOSIS — J038 Acute tonsillitis due to other specified organisms: Secondary | ICD-10-CM

## 2015-12-17 DIAGNOSIS — H6591 Unspecified nonsuppurative otitis media, right ear: Secondary | ICD-10-CM | POA: Diagnosis not present

## 2015-12-17 DIAGNOSIS — R05 Cough: Secondary | ICD-10-CM | POA: Diagnosis not present

## 2015-12-17 DIAGNOSIS — R059 Cough, unspecified: Secondary | ICD-10-CM

## 2015-12-17 MED ORDER — HYDROCOD POLST-CPM POLST ER 10-8 MG/5ML PO SUER: 5.0000 mL | Freq: Two times a day (BID) | ORAL | Status: DC | PRN

## 2015-12-17 MED ORDER — PREDNISONE 20 MG PO TABS: ORAL_TABLET | ORAL | Status: DC

## 2015-12-17 MED ORDER — LEVOFLOXACIN 500 MG PO TABS: 500.0000 mg | ORAL_TABLET | Freq: Every day | ORAL | Status: DC

## 2015-12-17 MED ORDER — METHYLPREDNISOLONE SODIUM SUCC 125 MG IJ SOLR
125.0000 mg | Freq: Once | INTRAMUSCULAR | Status: AC
  Administered 2015-12-17: 125 mg via INTRAMUSCULAR

## 2015-12-17 MED ORDER — FLUCONAZOLE 150 MG PO TABS: 150.0000 mg | ORAL_TABLET | Freq: Once | ORAL | Status: DC

## 2015-12-17 NOTE — Progress Notes (Signed)
   Subjective:    Patient ID: Margaret Lawson, female    DOB: 1981-02-08, 35 y.o.   MRN: [id number]  HPI  Pt is a 35 yo female who presents to the clinic with ST, sinsus pressure, right ear pain for 2 weeks.  She had her ovaries removed last Tuesday a week ago. She was given omnicef for sinus infection but feels worse after completing it. She denies any fever. She is hot and cold off and on but did just have her ovaries removed due to pre-cancer tissue found in uterus. She is having problems swallowing. No problems breathing. No appetite. She does have a productive cough. Cough is causing pain over surgery incisions. She does not have any narcotics for pain.   Review of Systems  All other systems reviewed and are negative.      Objective:   Physical Exam  Constitutional: She is oriented to person, place, and time. She appears well-developed and well-nourished.  HENT:  Head: Normocephalic and atraumatic.  Right Ear: External ear normal.  Left Ear: External ear normal.  Nose: Nose normal.  Bilateral enlarged tonsils 2+ hypertrophy with exudate.  Tenderness over maxillary and frontal tenderness to palpation.  Right TM bulging and erythematous without pus. No light reflex.  Left TM normal.   Eyes: Conjunctivae are normal.  Neck: Normal range of motion. Neck supple.  Bilateral tender adenopathy anterior cervical.   Cardiovascular: Normal rate, regular rhythm and normal heart sounds.   Pulmonary/Chest: Effort normal and breath sounds normal.  Lymphadenopathy:    She has cervical adenopathy.  Neurological: She is alert and oriented to person, place, and time.  Psychiatric: She has a normal mood and affect. Her behavior is normal.          Assessment & Plan:  Acute tonsillitis/right otitis media/cough- solumedrol 125mg  IM given in office today. Start prednisone for 5 days. levaquin for 10 days. tussinonex for cough. Gargle with salt water. Ibuprofen for pain. Follow up if not  improving in next 2 days. Any difficultly breathing follow up.

## 2015-12-17 NOTE — Patient Instructions (Signed)

## 2015-12-17 NOTE — Addendum Note (Signed)
Addended by: Beatris Ship L on: 12/17/2015 01:49 PM   Modules accepted: Orders

## 2015-12-20 ENCOUNTER — Other Ambulatory Visit: Payer: Self-pay | Admitting: Family Medicine

## 2015-12-24 ENCOUNTER — Other Ambulatory Visit: Payer: Self-pay

## 2015-12-24 MED ORDER — PANTOPRAZOLE SODIUM 40 MG PO TBEC: 40.0000 mg | DELAYED_RELEASE_TABLET | Freq: Every day | ORAL | Status: DC

## 2015-12-26 ENCOUNTER — Encounter: Payer: Self-pay | Admitting: Obstetrics & Gynecology

## 2015-12-26 ENCOUNTER — Ambulatory Visit: Payer: Self-pay | Admitting: Obstetrics & Gynecology

## 2016-01-09 ENCOUNTER — Encounter: Payer: Self-pay | Admitting: Obstetrics & Gynecology

## 2016-01-09 ENCOUNTER — Ambulatory Visit (INDEPENDENT_AMBULATORY_CARE_PROVIDER_SITE_OTHER): Payer: BLUE CROSS/BLUE SHIELD | Admitting: Obstetrics & Gynecology

## 2016-01-09 VITALS — BP 131/71 | HR 62 | Ht 69.0 in | Wt 240.0 lb

## 2016-01-09 DIAGNOSIS — Z9889 Other specified postprocedural states: Secondary | ICD-10-CM

## 2016-01-09 DIAGNOSIS — Z5181 Encounter for therapeutic drug level monitoring: Secondary | ICD-10-CM | POA: Diagnosis not present

## 2016-01-09 DIAGNOSIS — Z79890 Hormone replacement therapy (postmenopausal): Secondary | ICD-10-CM | POA: Diagnosis not present

## 2016-01-09 DIAGNOSIS — N760 Acute vaginitis: Secondary | ICD-10-CM | POA: Diagnosis not present

## 2016-01-09 DIAGNOSIS — A499 Bacterial infection, unspecified: Secondary | ICD-10-CM

## 2016-01-09 DIAGNOSIS — B9689 Other specified bacterial agents as the cause of diseases classified elsewhere: Secondary | ICD-10-CM

## 2016-01-09 MED ORDER — METRONIDAZOLE 500 MG PO TABS: 500.0000 mg | ORAL_TABLET | Freq: Two times a day (BID) | ORAL | Status: AC

## 2016-01-09 MED ORDER — ESTRADIOL 1 MG PO TABS: 1.0000 mg | ORAL_TABLET | Freq: Every day | ORAL | Status: DC

## 2016-01-09 NOTE — Progress Notes (Signed)
Patient ID: Margaret Lawson, female   DOB: [birth year], 35 y.o.   MRN: [id number] History:  35 y.o. G0P0000 here today for post op check and f/u of ERT after surgical menopause.  Pt reports decreased sex drive since surgery.  She reports hot flushes that occur 2 days before  its time to change her patch.  She does not find the hot flushes too bothersome. She also c/o increased mood changes.  She reports a h/o depression and anxiety and feels that it has gotten worse since the oophorectomy.  Pt reports a vaginal odor with no abnormal discharge.  The following portions of the patient's history were reviewed and updated as appropriate: allergies, current medications, past family history, past medical history, past social history, past surgical history and problem list.  Review of Systems:  Pertinent items are noted in HPI.  Objective:  Physical Exam Blood pressure 131/71, pulse 62, height 5\' 9"  (1.753 m), weight 240 lb (108.863 kg), last menstrual period 07/28/2015. Gen: NAD Abd: Soft, nontender and nondistended. Port sites well healed Pelvic:deferred  Labs and Imaging 12/10/2015 Diagnosis Ovaries, bilateral excision - BILATERAL OVARIES WITH BENIGN FOLLICULAR CYSTS AND FOCAL ENDOSALPINGIOSIS. - NO BORDERLINE TUMOR OR MALIGNANCY IDENTIFIED.  Assessment & Plan:  4 week post op check ERT management BV  Flagyl 500mg  bid x 7 days D/c Climara patch Estrace 0.1 mg po q day F/u in 3 months or sooner prn Pt counseled to make appt with behavioral health for counseling- given an info sheet for providers in the area  [place] L. Harraway-Smith, M.D., [place]

## 2016-01-22 ENCOUNTER — Telehealth: Payer: Self-pay

## 2016-01-22 MED ORDER — FLUCONAZOLE 150 MG PO TABS: 150.0000 mg | ORAL_TABLET | Freq: Once | ORAL | Status: DC

## 2016-01-22 NOTE — Telephone Encounter (Signed)
Patient called complaining of yeast infection (recent Flagyl use) with white discharge and itching. paitent given diflucan and if symptoms don't resolve she must call back to come in for swab. Patient states understanding.  Patient also states that she would like to switch back to the patch she was using due to mood. Patient states her mood was better on the patch than it is on the pill now. Patient would like to change the patch twice a week instead of once since at the last visit she noted that at end of week her mood was increased. Patient made aware that I will route to provider for input. Kathrene Alu RNBSN

## 2016-01-23 ENCOUNTER — Other Ambulatory Visit: Payer: Self-pay | Admitting: Obstetrics & Gynecology

## 2016-01-23 DIAGNOSIS — E8940 Asymptomatic postprocedural ovarian failure: Secondary | ICD-10-CM

## 2016-01-23 MED ORDER — ESTRADIOL 0.05 MG/24HR TD PTWK: 0.0500 mg | MEDICATED_PATCH | TRANSDERMAL | Status: DC

## 2016-01-23 NOTE — Telephone Encounter (Signed)
Called patient and made her aware that Dr. Maylene Roes- Tamala Julian only wants her to use the Climara patch once a week. Patient made aware DR. Harraway-smith did up the dosage of it and has sent in the new prescription to the pharmacy. Patient states understanding and plans to come back for follow up in 3 months. Kathrene Alu RN BSN

## 2016-03-23 ENCOUNTER — Other Ambulatory Visit: Payer: Self-pay | Admitting: Family Medicine

## 2016-04-06 ENCOUNTER — Ambulatory Visit: Payer: Self-pay | Admitting: Physician Assistant

## 2016-04-06 ENCOUNTER — Other Ambulatory Visit: Payer: Self-pay | Admitting: Family Medicine

## 2016-04-07 ENCOUNTER — Other Ambulatory Visit: Payer: Self-pay | Admitting: Family Medicine

## 2016-04-09 ENCOUNTER — Ambulatory Visit: Payer: Self-pay | Admitting: Family Medicine

## 2016-04-20 ENCOUNTER — Other Ambulatory Visit: Payer: Self-pay | Admitting: Family Medicine

## 2016-04-20 ENCOUNTER — Ambulatory Visit (INDEPENDENT_AMBULATORY_CARE_PROVIDER_SITE_OTHER): Payer: BLUE CROSS/BLUE SHIELD | Admitting: Obstetrics & Gynecology

## 2016-04-20 ENCOUNTER — Encounter: Payer: Self-pay | Admitting: Obstetrics & Gynecology

## 2016-04-20 VITALS — BP 135/70 | HR 60 | Ht 69.0 in | Wt 258.0 lb

## 2016-04-20 DIAGNOSIS — Z5181 Encounter for therapeutic drug level monitoring: Secondary | ICD-10-CM | POA: Diagnosis not present

## 2016-04-20 DIAGNOSIS — Z90710 Acquired absence of both cervix and uterus: Secondary | ICD-10-CM

## 2016-04-20 DIAGNOSIS — Z9889 Other specified postprocedural states: Secondary | ICD-10-CM

## 2016-04-20 DIAGNOSIS — N951 Menopausal and female climacteric states: Secondary | ICD-10-CM | POA: Diagnosis not present

## 2016-04-20 DIAGNOSIS — Z79890 Hormone replacement therapy (postmenopausal): Secondary | ICD-10-CM

## 2016-04-20 DIAGNOSIS — E876 Hypokalemia: Secondary | ICD-10-CM

## 2016-04-20 NOTE — Progress Notes (Signed)
   Subjective:    Patient ID: Margaret Lawson, female    DOB: 01-Aug-1980, 35 y.o.   MRN: [id number]  HPI 35 yo MW G0 here to discuss her post op menopausal symptoms. She has all her uterus, tubes, and ovaries removed (due to a borderline tumor). She has tried the estrogen patch and estrogen pills. She has developed what she thinks is a skin reaction to the adhesive and has tried several different places for the patch. She has not had the patch on for the last 4 days and thinks that her hot flashes are unchanged. She does think that her fibromyalgia and mood changes and hunger level have all worsened since not having the patch on.     Review of Systems She has seen many different therapists over the last 20 years and "has trust issues" with therapists.  She says that she has gained about 20# since her surgery.    Objective:   Physical Exam Obese loquacious WF NAD, breathing and ambulating normally       Assessment & Plan:  We spent approximately 20 minutes discussing her symptoms. At this time she does not want any new prescriptions. She will do research on estrogen injections. RTC 1 month

## 2016-04-23 ENCOUNTER — Ambulatory Visit (INDEPENDENT_AMBULATORY_CARE_PROVIDER_SITE_OTHER): Payer: BLUE CROSS/BLUE SHIELD

## 2016-04-23 ENCOUNTER — Encounter: Payer: Self-pay | Admitting: Sports Medicine

## 2016-04-23 ENCOUNTER — Ambulatory Visit (INDEPENDENT_AMBULATORY_CARE_PROVIDER_SITE_OTHER): Payer: BLUE CROSS/BLUE SHIELD | Admitting: Sports Medicine

## 2016-04-23 DIAGNOSIS — N958 Other specified menopausal and perimenopausal disorders: Secondary | ICD-10-CM | POA: Diagnosis not present

## 2016-04-23 DIAGNOSIS — M47812 Spondylosis without myelopathy or radiculopathy, cervical region: Secondary | ICD-10-CM

## 2016-04-23 DIAGNOSIS — M62838 Other muscle spasm: Secondary | ICD-10-CM

## 2016-04-23 DIAGNOSIS — J039 Acute tonsillitis, unspecified: Secondary | ICD-10-CM | POA: Insufficient documentation

## 2016-04-23 DIAGNOSIS — E8940 Asymptomatic postprocedural ovarian failure: Secondary | ICD-10-CM | POA: Insufficient documentation

## 2016-04-23 LAB — POCT RAPID STREP A (OFFICE): Rapid Strep A Screen: NEGATIVE

## 2016-04-23 MED ORDER — AMITRIPTYLINE HCL 25 MG PO TABS: 25.0000 mg | ORAL_TABLET | Freq: Every day | ORAL | 0 refills | Status: DC

## 2016-04-23 MED ORDER — PREDNISONE 50 MG PO TABS: 50.0000 mg | ORAL_TABLET | Freq: Every day | ORAL | 0 refills | Status: DC

## 2016-04-23 MED ORDER — CEFTRIAXONE SODIUM 1 G IJ SOLR
1.0000 g | Freq: Once | INTRAMUSCULAR | Status: AC
  Administered 2016-04-23: 1 g via INTRAMUSCULAR

## 2016-04-23 MED ORDER — ALBUTEROL SULFATE HFA 108 (90 BASE) MCG/ACT IN AERS: 2.0000 | INHALATION_SPRAY | RESPIRATORY_TRACT | 4 refills | Status: DC | PRN

## 2016-04-23 MED ORDER — AZITHROMYCIN 250 MG PO TABS: ORAL_TABLET | ORAL | 0 refills | Status: DC

## 2016-04-23 MED ORDER — POTASSIUM CHLORIDE 20 MEQ PO PACK: 10.0000 meq | PACK | Freq: Two times a day (BID) | ORAL | 2 refills | Status: DC

## 2016-04-23 NOTE — Assessment & Plan Note (Signed)
Rocephin 1 g, prednisone, azithromycin.

## 2016-04-23 NOTE — Assessment & Plan Note (Signed)
Has not had good responses to oral or topical estrogen. Currently taking gabapentin which is appropriate for vasomotor instability, she does desire that we add Elavil, if this doesn't work after an appropriate up titration of dose and would recommend we switch to an SSRI like Zoloft.

## 2016-04-23 NOTE — Assessment & Plan Note (Signed)
X-rays, rehabilitation exercises.  Return in one month, MRI for interventional planning if no better.

## 2016-04-23 NOTE — Progress Notes (Signed)
  Subjective:    CC: Sore throat  HPI: This is a pleasant 35 year old female with a history of tonsillitis, for a few days she's had a recurrence of pain that she localizes on her throat, she has swelling and erythema of both tonsils, no cough, subjective fevers and chills, tender cervical lymphadenopathy.  Neck pain: Would like me to work on this, pain is moderate, persistent, localized in the back of the neck without radicular symptoms.  Surgical menopause: Has not had good responses with oral or topical estrogen, currently on gabapentin, declines starting an SSRI but would like to rather start with amitriptyline.  Past medical history:  Negative.  See flowsheet/record as well for more information.  Surgical history: Negative.  See flowsheet/record as well for more information.  Family history: Negative.  See flowsheet/record as well for more information.  Social history: Negative.  See flowsheet/record as well for more information.  Allergies, and medications have been entered into the medical record, reviewed, and no changes needed.   Review of Systems: No fevers, chills, night sweats, weight loss, chest pain, or shortness of breath.   Objective:    General: Well Developed, well nourished, and in no acute distress.  Neuro: Alert and oriented x3, extra-ocular muscles intact, sensation grossly intact.  HEENT: Normocephalic, atraumatic, pupils equal round reactive to light, neck supple, no masses, no lymphadenopathy, thyroid nonpalpable. Oropharynx shows significant tonsillar erythema with bilateral exudates, no hot potato voice. Nasopharynx and ear canals unremarkable. Skin: Warm and dry, no rashes. Cardiac: Regular rate and rhythm, no murmurs rubs or gallops, no lower extremity edema.  Respiratory: Clear to auscultation bilaterally. Not using accessory muscles, speaking in full sentences.  Impression and Recommendations:    Acute tonsillitis Rocephin 1 g, prednisone,  azithromycin.  Surgical menopause Has not had good responses to oral or topical estrogen. Currently taking gabapentin which is appropriate for vasomotor instability, she does desire that we add Elavil, if this doesn't work after an appropriate up titration of dose and would recommend we switch to an SSRI like Zoloft.  Cervical spondylosis without myelopathy X-rays, rehabilitation exercises.  Return in one month, MRI for interventional planning if no better.

## 2016-05-04 ENCOUNTER — Telehealth: Payer: Self-pay

## 2016-05-04 MED ORDER — POTASSIUM CHLORIDE CRYS ER 20 MEQ PO TBCR: 20.0000 meq | EXTENDED_RELEASE_TABLET | Freq: Two times a day (BID) | ORAL | 0 refills | Status: DC

## 2016-05-04 NOTE — Telephone Encounter (Signed)
Pt needs a refill on her potassium med.  She is asking that it be sent in pill form instead of powder. Please advise.

## 2016-05-04 NOTE — Telephone Encounter (Signed)
Filled for one month. Her last labs were 6 months ago. She is due for CMP and lipids. Please order and have her go for labs.

## 2016-05-05 NOTE — Telephone Encounter (Signed)
Results and recommendations left on vm

## 2016-05-25 ENCOUNTER — Ambulatory Visit: Payer: Self-pay | Admitting: Sports Medicine

## 2016-06-26 ENCOUNTER — Ambulatory Visit: Payer: Self-pay | Admitting: Physician Assistant

## 2016-06-26 ENCOUNTER — Other Ambulatory Visit: Payer: Self-pay | Admitting: *Deleted

## 2016-06-26 MED ORDER — PANTOPRAZOLE SODIUM 40 MG PO TBEC: DELAYED_RELEASE_TABLET | ORAL | 0 refills | Status: DC

## 2016-07-07 ENCOUNTER — Encounter: Payer: Self-pay | Admitting: Family Medicine

## 2016-07-07 ENCOUNTER — Ambulatory Visit (INDEPENDENT_AMBULATORY_CARE_PROVIDER_SITE_OTHER): Payer: BLUE CROSS/BLUE SHIELD | Admitting: Family Medicine

## 2016-07-07 VITALS — BP 139/76 | HR 71 | Temp 98.1°F | Wt 262.0 lb

## 2016-07-07 DIAGNOSIS — H7292 Unspecified perforation of tympanic membrane, left ear: Secondary | ICD-10-CM | POA: Diagnosis not present

## 2016-07-07 DIAGNOSIS — J0100 Acute maxillary sinusitis, unspecified: Secondary | ICD-10-CM | POA: Diagnosis not present

## 2016-07-07 DIAGNOSIS — J4541 Moderate persistent asthma with (acute) exacerbation: Secondary | ICD-10-CM

## 2016-07-07 DIAGNOSIS — H6501 Acute serous otitis media, right ear: Secondary | ICD-10-CM | POA: Diagnosis not present

## 2016-07-07 MED ORDER — CEFDINIR 300 MG PO CAPS: 300.0000 mg | ORAL_CAPSULE | Freq: Two times a day (BID) | ORAL | 0 refills | Status: DC

## 2016-07-07 MED ORDER — PREDNISONE 20 MG PO TABS: 40.0000 mg | ORAL_TABLET | Freq: Every day | ORAL | 0 refills | Status: DC

## 2016-07-07 MED ORDER — IPRATROPIUM-ALBUTEROL 0.5-2.5 (3) MG/3ML IN SOLN: 3.0000 mL | RESPIRATORY_TRACT | 1 refills | Status: DC | PRN

## 2016-07-07 NOTE — Progress Notes (Signed)
Subjective:    Patient ID: Margaret Lawson, female    DOB: 1980-11-25, 35 y.o.   MRN: [id number]  HPI 35 year old female comes in today complaining of sinus congestion 1 month. She's now getting ear pain, particularly in the right ear. She said that initially started with sinus congestion and tonsillitis. She mostly did salt water gargles and it eventually got a little better but now she is having a lot of sinus congestion and pressure. Particularly over the right maxillary sinus and into her right ear. She said initially her ear was popping like there was a lot of fluid and pressure but now she just feels like she can't hear out of it at all. She's been taking Robitussin, Claritin-D, and Nasonex which was recommended by a physician on call at work. She denies any recent fevers that she is actually measured but she did feel subjectively like she had a low-grade temperature this morning.   Review of Systems  BP 139/76   Pulse 71   Temp 98.1 F (36.7 C) (Oral)   Wt 262 lb (118.8 kg)   LMP 07/28/2015   BMI 38.69 kg/m     Allergies  Allergen Reactions  . Adhesive [Tape] Other (See Comments)    Blisters, paper tape is worse  . Celecoxib Nausea And Vomiting  . Doxycycline     Double vision   . Erythromycin Nausea And Vomiting  . Lyrica [Pregabalin]     Induced lactation  . Morphine And Related     Decrease bp  . Penicillins Hives    Has patient had a PCN reaction causing immediate rash, facial/tongue/throat swelling, SOB or lightheadedness with hypotension: Yes Has patient had a PCN reaction causing severe rash involving mucus membranes or skin necrosis: No Has patient had a PCN reaction that required hospitalization No Has patient had a PCN reaction occurring within the last 10 years: Yes If all of the above answers are "NO", then may proceed with Cephalosporin use.   . Latex Rash    Past Medical History:  Diagnosis Date  . Anxiety    severe  . Asthma, chronic 12/27/2013   . Bipolar 2 disorder ([place])   . Essential hypertension, benign 12/27/2013  . Fibromyalgia 12/27/2013  . GERD (gastroesophageal reflux disease)   . Headache    migraines, over stimulation  . Leukocytosis 12/27/2013  . Low iron   . PCOS (polycystic ovarian syndrome)   . Perforation of left tympanic membrane 12/27/2013  . Poor dentition    right side upper and lower  . Restless leg syndrome   . Right leg pain   . Teeth clenching   . Teeth grinding   . Weakness    when exposed to hot or cold temperatures    Past Surgical History:  Procedure Laterality Date  . CHOLECYSTECTOMY    . ROBOTIC ASSISTED TOTAL HYSTERECTOMY WITH BILATERAL SALPINGO OOPHERECTOMY Bilateral 09/10/2015   Procedure: ROBOTIC ASSISTED TOTAL HYSTERECTOMY WITH BILATERAL SALPINGECTOMY;  Surgeon: Lavonia Drafts, MD;  Location: [facility];  Service: Gynecology;  Laterality: Bilateral;  . WISDOM TOOTH EXTRACTION      Social History   Social History  . Marital status: Married    Spouse name: N/A  . Number of children: N/A  . Years of education: N/A   Occupational History  . unemployed    Social History Main Topics  . Smoking status: Current Every Day Smoker    Packs/day: 1.00    Years: 20.00    Types: Cigarettes  .  Smokeless tobacco: Never Used     Comment: uses 6mg  vapes  . Alcohol use No  . Drug use: No  . Sexual activity: Yes    Partners: Male    Birth control/ protection: None   Other Topics Concern  . Not on file   Social History Narrative  . No narrative on file    Family History  Problem Relation Age of Onset  . Aneurysm Mother     brain  . Depression Father   . Heart disease Father   . Mitral valve prolapse Father   . Diabetes Father   . Hypertension Father   . Depression Sister   . Breast cancer Paternal Grandfather   . Breast cancer Paternal Aunt     Outpatient Encounter Prescriptions as of 07/07/2016  Medication Sig  . acetaminophen (TYLENOL) 500 MG tablet Take 500 mg by mouth  every 6 (six) hours as needed for mild pain or moderate pain.  Marland Kitchen albuterol (PROVENTIL HFA;VENTOLIN HFA) 108 (90 Base) MCG/ACT inhaler Inhale 2 puffs into the lungs every 4 (four) hours as needed for wheezing.  Marland Kitchen albuterol (PROVENTIL) (2.5 MG/3ML) 0.083% nebulizer solution Take 3 mLs (2.5 mg total) by nebulization every 4 (four) hours as needed for wheezing.  Marland Kitchen atenolol (TENORMIN) 100 MG tablet Take 1 tablet (100 mg total) by mouth daily.  . calcium carbonate (OSCAL) 1500 (600 Ca) MG TABS tablet Take by mouth 2 (two) times daily with a meal.  . ipratropium-albuterol (DUONEB) 0.5-2.5 (3) MG/3ML SOLN Take 3 mLs by nebulization every 4 (four) hours as needed (wheezing/cough/sob).  . Misc Natural Products (ESTROVEN ENERGY PO) Take by mouth.  . Multiple Vitamin (MULTIVITAMIN) capsule Take 1 capsule by mouth daily.  . pantoprazole (PROTONIX) 40 MG tablet TAKE 1 TABLET(40 MG) BY MOUTH DAILY  . potassium chloride SA (K-DUR,KLOR-CON) 20 MEQ tablet Take 1 tablet (20 mEq total) by mouth 2 (two) times daily.  . promethazine (PHENERGAN) 25 MG tablet Take 1 tablet (25 mg total) by mouth every 6 (six) hours as needed for nausea or vomiting.  Marland Kitchen tiZANidine (ZANAFLEX) 4 MG tablet Take 1/2 to 1 tablet by mouth every 6 hours as needed for muscle spasms.  NEED FOLLOW UP APPOINTMENT FOR MORE REFILLS  . triamterene-hydrochlorothiazide (MAXZIDE-25) 37.5-25 MG tablet Take 1 tablet by mouth daily.  . valACYclovir (VALTREX) 500 MG tablet Take 1 tablet (500 mg total) by mouth 2 (two) times daily. (Patient taking differently: Take 500 mg by mouth daily. )  . [DISCONTINUED] ipratropium-albuterol (DUONEB) 0.5-2.5 (3) MG/3ML SOLN Take 3 mLs by nebulization every 4 (four) hours as needed (wheezing/cough/sob).  . cefdinir (OMNICEF) 300 MG capsule Take 1 capsule (300 mg total) by mouth 2 (two) times daily.  . predniSONE (DELTASONE) 20 MG tablet Take 2 tablets (40 mg total) by mouth daily.  . [DISCONTINUED] amitriptyline (ELAVIL) 25 MG  tablet Take 1 tablet (25 mg total) by mouth at bedtime. (Patient not taking: Reported on 07/07/2016)  . [DISCONTINUED] azithromycin (ZITHROMAX Z-PAK) 250 MG tablet Take 2 tablets (500 mg) on  Day 1,  followed by 1 tablet (250 mg) once daily on Days 2 through 5.  . [DISCONTINUED] cetirizine (ZYRTEC) 10 MG tablet Take 10 mg by mouth daily.  . [DISCONTINUED] dextromethorphan-guaiFENesin (MUCINEX DM) 30-600 MG 12hr tablet Take 1 tablet by mouth 2 (two) times daily as needed for cough.  . [DISCONTINUED] gabapentin (NEURONTIN) 600 MG tablet TAKE 1 TABLET BY MOUTH THREE TIMES DAILY (Patient not taking: Reported on 07/07/2016)  . [  DISCONTINUED] predniSONE (DELTASONE) 50 MG tablet Take 1 tablet (50 mg total) by mouth daily.   No facility-administered encounter medications on file as of 07/07/2016.          Objective:   Physical Exam  Constitutional: She is oriented to person, place, and time. She appears well-developed and well-nourished.  HENT:  Head: Normocephalic and atraumatic.  Right Ear: External ear normal.  Left Ear: External ear normal.  Nose: Nose normal.  Mouth/Throat: Oropharynx is clear and moist.  Left canal is clear. She does have a chronic perforation at the 10:00 position on the left tympanic membrane. On the right tympanic membrane is bulging with increased opacification and distortion of the light reflex and significant erythema of the left half of the eardrum.  Eyes: Conjunctivae and EOM are normal. Pupils are equal, round, and reactive to light.  Neck: Neck supple. No thyromegaly present.  Cardiovascular: Normal rate, regular rhythm and normal heart sounds.   Pulmonary/Chest: Effort normal and breath sounds normal. She has no wheezes.  Lymphadenopathy:    She has no cervical adenopathy.  Neurological: She is alert and oriented to person, place, and time.  Skin: Skin is warm and dry.  Psychiatric: She has a normal mood and affect.        Assessment & Plan:  Acute  sinusitis, maxillary-we'll treat with Omnicef since she did have azithromycin about 2 months ago and she does have multiple drug allergies. I did encourage her to stop the Claritin-D. She can continue the Robitussin and the Nasonex today. I will also send her prescription for prednisone if she's not starting to get some relief with her ear after couple of days on the antibiotic. Call if not significantly better in one week.  Acute otitis media, right ear-we'll go ahead and treat with Omnicef as above. She has penicillin allergy.  Asthma-she has had a little bit more wheezing and shortness of breath than usual but doesn't feel like it's an exacerbation. She would like a refill on her DuoNeb today. Lungs are clear on exam today.

## 2016-07-07 NOTE — Patient Instructions (Signed)
Please stop the Claritin-D. Continue the Nasonex and Robitussin. I'll if not significantly better within 1 week.

## 2016-07-15 ENCOUNTER — Telehealth: Payer: Self-pay

## 2016-07-15 MED ORDER — FLUCONAZOLE 150 MG PO TABS: 150.0000 mg | ORAL_TABLET | Freq: Once | ORAL | 0 refills | Status: AC

## 2016-07-15 NOTE — Telephone Encounter (Signed)
Medication sent to pharmacy and patient is aware  

## 2016-07-15 NOTE — Telephone Encounter (Signed)
Margaret Lawson called. She was seen by Dr Madilyn Fireman on 07/07/2016 and was treated with an antibiotic. She now has vaginal itching. Denies fever, chills, sweats or pelvic pain. She would like medication sent in for a yeast infection. Please advise.

## 2016-07-15 NOTE — Telephone Encounter (Signed)
Ok for diflucan 150mg  once and repeat in 48 to 72 hours if symptoms persist. #2 NRF.

## 2016-07-31 ENCOUNTER — Ambulatory Visit (INDEPENDENT_AMBULATORY_CARE_PROVIDER_SITE_OTHER): Payer: BLUE CROSS/BLUE SHIELD | Admitting: Physician Assistant

## 2016-07-31 ENCOUNTER — Encounter: Payer: Self-pay | Admitting: Physician Assistant

## 2016-07-31 VITALS — BP 109/73 | HR 60 | Ht 69.0 in | Wt 260.0 lb

## 2016-07-31 DIAGNOSIS — E876 Hypokalemia: Secondary | ICD-10-CM | POA: Diagnosis not present

## 2016-07-31 DIAGNOSIS — K644 Residual hemorrhoidal skin tags: Secondary | ICD-10-CM

## 2016-07-31 DIAGNOSIS — H6592 Unspecified nonsuppurative otitis media, left ear: Secondary | ICD-10-CM

## 2016-07-31 DIAGNOSIS — B379 Candidiasis, unspecified: Secondary | ICD-10-CM

## 2016-07-31 DIAGNOSIS — N6452 Nipple discharge: Secondary | ICD-10-CM | POA: Diagnosis not present

## 2016-07-31 DIAGNOSIS — R195 Other fecal abnormalities: Secondary | ICD-10-CM

## 2016-07-31 MED ORDER — FLUCONAZOLE 150 MG PO TABS: 150.0000 mg | ORAL_TABLET | Freq: Once | ORAL | 0 refills | Status: AC

## 2016-07-31 MED ORDER — AZITHROMYCIN 250 MG PO TABS: ORAL_TABLET | ORAL | 0 refills | Status: DC

## 2016-07-31 NOTE — Patient Instructions (Signed)
Otitis Media, Adult Otitis media is redness, soreness, and puffiness (swelling) in the space just behind your eardrum (middle ear). It may be caused by allergies or infection. It often happens along with a cold. Follow these instructions at home:  Take your medicine as told. Finish it even if you start to feel better.  Only take over-the-counter or prescription medicines for pain, discomfort, or fever as told by your doctor.  Follow up with your doctor as told. Contact a doctor if:  You have otitis media only in one ear, or bleeding from your nose, or both.  You notice a lump on your neck.  You are not getting better in 3-5 days.  You feel worse instead of better. Get help right away if:  You have pain that is not helped with medicine.  You have puffiness, redness, or pain around your ear.  You get a stiff neck.  You cannot move part of your face (paralysis).  You notice that the bone behind your ear hurts when you touch it. This information is not intended to replace advice given to you by your health care provider. Make sure you discuss any questions you have with your health care provider. Document Released: 12/30/2007 Document Revised: 12/19/2015 Document Reviewed: 02/07/2013 Elsevier Interactive Patient Education  2017 Elsevier Inc.  

## 2016-07-31 NOTE — Progress Notes (Addendum)
Subjective:    Patient ID: Margaret Lawson, female    DOB: 1981/06/08, 36 y.o.   MRN: [id number]  HPI  Pt is a 36 yo female who presents to the clinic with multiple concerns.   She is having loose stools on a regular basis since her gallbladder was taken out in 2012. She has seen GI and told her she had IBS. pepcid actually helped but stopped when she started protonix for GERD.   She has a small mass around her anus that she would like removed. It has been there for 2 plus years. It is not painful but it makes her self conscious.   Pt is having some left breast only nipple intermittent discharge for 1-2 years that is "rust colored" for last month.no know breast trauma. Denies any masses but admits to fibrous breast. No fam hx of breast cancer.    Still having some right ear popping 75 percent better. Her left ear has perforation. Seen by another provider in office and given abx for ear infection.   Pt is not taking potasssium tablets and wonders if she should.      Review of Systems See HPI.     Objective:   Physical Exam  Constitutional: She is oriented to person, place, and time. She appears well-developed and well-nourished.  HENT:  Head: Normocephalic and atraumatic.  Right Ear: External ear normal.  Nose: Nose normal.  Mouth/Throat: Oropharynx is clear and moist. No oropharyngeal exudate.  Left TM perforated with mucoid effusion behind TM.    Eyes: Conjunctivae are normal. Right eye exhibits no discharge. Left eye exhibits no discharge.  Neck: Normal range of motion. Neck supple.  Cardiovascular: Normal rate, regular rhythm and normal heart sounds.   Pulmonary/Chest: Effort normal and breath sounds normal.    Abdominal: Soft. Bowel sounds are normal. She exhibits no distension and no mass. There is no tenderness. There is no rebound and no guarding.  Genitourinary:     Lymphadenopathy:    She has no cervical adenopathy.  Neurological: She is alert and oriented  to person, place, and time.  Psychiatric: She has a normal mood and affect. Her behavior is normal.          Assessment & Plan:  Marland KitchenMarland KitchenDiagnoses and all orders for this visit:  Anorectal skin tags  Hypokalemia -     Basic metabolic panel  Mucoid otitis media of left ear with effusion -     azithromycin (ZITHROMAX) 250 MG tablet; Take 2 tablets now and then one tablet for 4 days.  Nipple discharge in female -     MM DIAG BREAST TOMO BILATERAL; Future -     US BREAST LTD UNI LEFT INC AXILLA; Future -     MM DIAG BREAST TOMO BILATERAL -     US BREAST LTD UNI LEFT INC AXILLA  Yeast infection -     fluconazole (DIFLUCAN) 150 MG tablet; Take 1 tablet (150 mg total) by mouth once. Repeat if symptoms persist in 48 hours.  Loose stools  Skin Tag Removal Procedure Note  Pre-operative Diagnosis: Classic skin tags (acrochordon)  Post-operative Diagnosis: Classic skin tags (acrochordon)  Locations:anorectal area between vagina and anus  Indications: irriration  Anesthesia: ethyl chloride  Procedure Details  The risks (including bleeding and infection) and benefits of the procedure and Verbal informed consent obtained. Using sterile iris scissors, multiple skin tags were snipped off at their bases after cleansing with Betadine.  Bleeding was controlled by pressure.  Findings: Pathognomonic benign lesions  not sent for pathological exam.  Condition: Stable  Complications: none.  Plan: 1. Instructed to keep the wounds dry and covered for 24-48h and clean thereafter. 2. Warning signs of infection were reviewed.   3. Recommended that the patient use OTC acetaminophen as needed for pain.  4. Return as needed.  Follow up for loose stool discussion could consider bile acid sequestrant. Consider trying pepcid again if helped before can take with protonix.

## 2016-08-01 LAB — BASIC METABOLIC PANEL
BUN: 18 mg/dL (ref 7–25)
CHLORIDE: 102 mmol/L (ref 98–110)
CO2: 28 mmol/L (ref 20–31)
CREATININE: 0.82 mg/dL (ref 0.50–1.10)
Calcium: 9.1 mg/dL (ref 8.6–10.2)
GLUCOSE: 90 mg/dL (ref 65–99)
Potassium: 4.2 mmol/L (ref 3.5–5.3)
Sodium: 140 mmol/L (ref 135–146)

## 2016-08-03 DIAGNOSIS — K644 Residual hemorrhoidal skin tags: Secondary | ICD-10-CM | POA: Insufficient documentation

## 2016-08-03 DIAGNOSIS — N6452 Nipple discharge: Secondary | ICD-10-CM | POA: Insufficient documentation

## 2016-08-03 DIAGNOSIS — R195 Other fecal abnormalities: Secondary | ICD-10-CM | POA: Insufficient documentation

## 2016-08-11 ENCOUNTER — Ambulatory Visit
Admission: RE | Admit: 2016-08-11 | Discharge: 2016-08-11 | Disposition: A | Discharge status: Home / Self Care | Payer: BLUE CROSS/BLUE SHIELD | Source: Ambulatory Visit | Attending: Physician Assistant | Admitting: Physician Assistant

## 2016-08-11 NOTE — Addendum Note (Signed)
Addended by: Donella Stade on: 08/11/2016 01:11 PM   Modules accepted: Orders

## 2016-08-21 ENCOUNTER — Ambulatory Visit (HOSPITAL_COMMUNITY): Admission: RE | Admit: 2016-08-21 | Payer: BLUE CROSS/BLUE SHIELD | Source: Ambulatory Visit

## 2016-09-11 ENCOUNTER — Ambulatory Visit (HOSPITAL_COMMUNITY): Payer: BLUE CROSS/BLUE SHIELD

## 2016-09-22 ENCOUNTER — Other Ambulatory Visit: Payer: Self-pay | Admitting: Sports Medicine

## 2016-10-05 ENCOUNTER — Telehealth: Payer: Self-pay

## 2016-10-05 MED ORDER — TRIAMTERENE-HCTZ 37.5-25 MG PO TABS: 1.0000 | ORAL_TABLET | Freq: Every day | ORAL | 1 refills | Status: DC

## 2016-10-05 NOTE — Telephone Encounter (Signed)
Received a fax from [facility] for a refill for Triamterene-HCTZ 37.5-25 mg once daily, 90 day supply. Last prescribed by Dr Ileene Rubens.

## 2016-10-08 ENCOUNTER — Other Ambulatory Visit: Payer: Self-pay

## 2016-10-08 DIAGNOSIS — I10 Essential (primary) hypertension: Secondary | ICD-10-CM

## 2016-10-08 MED ORDER — ATENOLOL 100 MG PO TABS: 100.0000 mg | ORAL_TABLET | Freq: Every day | ORAL | 3 refills | Status: DC

## 2016-11-13 ENCOUNTER — Ambulatory Visit (INDEPENDENT_AMBULATORY_CARE_PROVIDER_SITE_OTHER): Payer: BLUE CROSS/BLUE SHIELD | Admitting: Physician Assistant

## 2016-11-13 ENCOUNTER — Encounter: Payer: Self-pay | Admitting: Physician Assistant

## 2016-11-13 VITALS — BP 135/80 | HR 74 | Ht 69.0 in | Wt 258.0 lb

## 2016-11-13 DIAGNOSIS — R51 Headache: Secondary | ICD-10-CM

## 2016-11-13 DIAGNOSIS — E8940 Asymptomatic postprocedural ovarian failure: Secondary | ICD-10-CM | POA: Diagnosis not present

## 2016-11-13 DIAGNOSIS — R5383 Other fatigue: Secondary | ICD-10-CM

## 2016-11-13 DIAGNOSIS — I10 Essential (primary) hypertension: Secondary | ICD-10-CM

## 2016-11-13 DIAGNOSIS — F3181 Bipolar II disorder: Secondary | ICD-10-CM

## 2016-11-13 DIAGNOSIS — Z8249 Family history of ischemic heart disease and other diseases of the circulatory system: Secondary | ICD-10-CM | POA: Diagnosis not present

## 2016-11-13 DIAGNOSIS — R519 Headache, unspecified: Secondary | ICD-10-CM

## 2016-11-13 MED ORDER — ARIPIPRAZOLE 5 MG PO TABS: ORAL_TABLET | ORAL | 0 refills | Status: DC
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
## 2016-11-14 LAB — VITAMIN D 25 HYDROXY (VIT D DEFICIENCY, FRACTURES): Vit D, 25-Hydroxy: 35 ng/mL (ref 30–100)

## 2016-11-14 LAB — COMPREHENSIVE METABOLIC PANEL
ALK PHOS: 66 U/L (ref 33–115)
ALT: 20 U/L (ref 6–29)
AST: 18 U/L (ref 10–30)
Albumin: 4.2 g/dL (ref 3.6–5.1)
BUN: 15 mg/dL (ref 7–25)
CO2: 24 mmol/L (ref 20–31)
CREATININE: 0.78 mg/dL (ref 0.50–1.10)
Calcium: 9.9 mg/dL (ref 8.6–10.2)
Chloride: 104 mmol/L (ref 98–110)
GLUCOSE: 93 mg/dL (ref 65–99)
POTASSIUM: 4.2 mmol/L (ref 3.5–5.3)
SODIUM: 139 mmol/L (ref 135–146)
Total Bilirubin: 0.5 mg/dL (ref 0.2–1.2)
Total Protein: 6.9 g/dL (ref 6.1–8.1)

## 2016-11-14 LAB — CBC
HCT: 44 % (ref 35.0–45.0)
Hemoglobin: 14.9 g/dL (ref 11.7–15.5)
MCH: 30.2 pg (ref 27.0–33.0)
MCHC: 33.9 g/dL (ref 32.0–36.0)
MCV: 89.1 fL (ref 80.0–100.0)
MPV: 11.5 fL (ref 7.5–12.5)
PLATELETS: 241 10*3/uL (ref 140–400)
RBC: 4.94 MIL/uL (ref 3.80–5.10)
RDW: 13.9 % (ref 11.0–15.0)
WBC: 13.5 10*3/uL — ABNORMAL HIGH (ref 3.8–10.8)

## 2016-11-14 LAB — SEDIMENTATION RATE: Sed Rate: 18 mm/hr (ref 0–20)

## 2016-11-14 LAB — FERRITIN: Ferritin: 99 ng/mL (ref 10–154)

## 2016-11-14 LAB — VITAMIN B12: Vitamin B-12: 598 pg/mL (ref 200–1100)

## 2016-11-14 LAB — TSH: TSH: 1.29 mIU/L

## 2016-11-15 ENCOUNTER — Encounter: Payer: Self-pay | Admitting: Physician Assistant

## 2016-11-15 DIAGNOSIS — R519 Headache, unspecified: Secondary | ICD-10-CM | POA: Insufficient documentation

## 2016-11-15 DIAGNOSIS — R51 Headache: Secondary | ICD-10-CM

## 2016-11-15 DIAGNOSIS — Z8249 Family history of ischemic heart disease and other diseases of the circulatory system: Secondary | ICD-10-CM | POA: Insufficient documentation

## 2016-11-16 ENCOUNTER — Encounter: Payer: Self-pay | Admitting: Physician Assistant

## 2016-11-16 DIAGNOSIS — F3181 Bipolar II disorder: Secondary | ICD-10-CM

## 2016-11-16 LAB — C-REACTIVE PROTEIN: CRP: 24 mg/L — ABNORMAL HIGH (ref <8.0)

## 2016-11-16 MED ORDER — ARIPIPRAZOLE 5 MG PO TABS
ORAL_TABLET | ORAL | 0 refills | Status: DC
Start: 2016-11-16 — End: 2016-12-22

## 2016-11-23 ENCOUNTER — Ambulatory Visit (INDEPENDENT_AMBULATORY_CARE_PROVIDER_SITE_OTHER): Payer: BLUE CROSS/BLUE SHIELD

## 2016-11-23 DIAGNOSIS — G43909 Migraine, unspecified, not intractable, without status migrainosus: Secondary | ICD-10-CM

## 2016-11-23 DIAGNOSIS — Z8249 Family history of ischemic heart disease and other diseases of the circulatory system: Secondary | ICD-10-CM | POA: Diagnosis not present

## 2016-11-23 MED ORDER — GADOBENATE DIMEGLUMINE 529 MG/ML IV SOLN
20.0000 mL | Freq: Once | INTRAVENOUS | Status: AC | PRN
  Administered 2016-11-23: 20 mL via INTRAVENOUS

## 2016-11-24 ENCOUNTER — Encounter: Payer: Self-pay | Admitting: Physician Assistant

## 2016-11-26 ENCOUNTER — Ambulatory Visit (INDEPENDENT_AMBULATORY_CARE_PROVIDER_SITE_OTHER): Payer: BLUE CROSS/BLUE SHIELD | Admitting: Physician Assistant

## 2016-11-26 VITALS — BP 123/85 | HR 78 | Wt 255.0 lb

## 2016-11-26 DIAGNOSIS — J4541 Moderate persistent asthma with (acute) exacerbation: Secondary | ICD-10-CM

## 2016-11-26 MED ORDER — IPRATROPIUM-ALBUTEROL 0.5-2.5 (3) MG/3ML IN SOLN: 3.0000 mL | RESPIRATORY_TRACT | 1 refills | Status: DC | PRN

## 2016-11-26 MED ORDER — MONTELUKAST SODIUM 10 MG PO TABS: 10.0000 mg | ORAL_TABLET | Freq: Every day | ORAL | 3 refills | Status: DC

## 2016-11-26 NOTE — Progress Notes (Signed)
HPI:                                                                Racquelle Hyser is a 36 y.o. female who presents to [facility] today for cough and congestion  Cough  This is a new problem. The current episode started in the past 7 days. The problem has been gradually worsening. The problem occurs every few minutes. The cough is productive of sputum. Associated symptoms include nasal congestion, shortness of breath and wheezing. Pertinent negatives include no fever or rash. Risk factors for lung disease include smoking/tobacco exposure. She has tried a beta-agonist inhaler (Tylenol, Claritin-D, Flonase, Robitussin-DM) for the symptoms. Improvement on treatment: Has needed rescue inhaler 3-4 times per day. Her past medical history is significant for asthma.    Past Medical History:  Diagnosis Date  . Anxiety    severe  . Asthma, chronic 12/27/2013  . Bipolar 2 disorder ([place])   . Essential hypertension, benign 12/27/2013  . Fibromyalgia 12/27/2013  . GERD (gastroesophageal reflux disease)   . Headache    migraines, over stimulation  . Leukocytosis 12/27/2013  . Low iron   . PCOS (polycystic ovarian syndrome)   . Perforation of left tympanic membrane 12/27/2013  . Poor dentition    right side upper and lower  . Restless leg syndrome   . Right leg pain   . Teeth clenching   . Teeth grinding   . Weakness    when exposed to hot or cold temperatures   Past Surgical History:  Procedure Laterality Date  . CHOLECYSTECTOMY    . ROBOTIC ASSISTED TOTAL HYSTERECTOMY WITH BILATERAL SALPINGO OOPHERECTOMY Bilateral 09/10/2015   Procedure: ROBOTIC ASSISTED TOTAL HYSTERECTOMY WITH BILATERAL SALPINGECTOMY;  Surgeon: Lavonia Drafts, MD;  Location: [facility];  Service: Gynecology;  Laterality: Bilateral;  . WISDOM TOOTH EXTRACTION     Social History  Substance Use Topics  . Smoking status: Current Every Day Smoker    Packs/day: 1.00    Years:  20.00    Types: Cigarettes  . Smokeless tobacco: Never Used     Comment: uses 6mg  vapes  . Alcohol use No   family history includes Aneurysm in her mother; Breast cancer in her paternal aunt and paternal grandfather; Depression in her father and sister; Diabetes in her father; Heart disease in her father; Hypertension in her father; Mitral valve prolapse in her father.  ROS: negative except as noted in the HPI  Medications: Current Outpatient Prescriptions  Medication Sig Dispense Refill  . acetaminophen (TYLENOL) 500 MG tablet Take 500 mg by mouth every 6 (six) hours as needed for mild pain or moderate pain.    Marland Kitchen albuterol (PROVENTIL HFA;VENTOLIN HFA) 108 (90 Base) MCG/ACT inhaler Inhale 2 puffs into the lungs every 4 (four) hours as needed for wheezing. 18 g 4  . albuterol (PROVENTIL) (2.5 MG/3ML) 0.083% nebulizer solution Take 3 mLs (2.5 mg total) by nebulization every 4 (four) hours as needed for wheezing. 75 mL 0  . ARIPiprazole (ABILIFY) 5 MG tablet Take 1/2 tablet for 5 days then increase to one tablet daily. 30 tablet 0  . atenolol (TENORMIN) 100 MG tablet Take 1 tablet (100 mg total) by mouth daily. 90 tablet 3  .  CALCIUM-VITAMIN D PO Take by mouth.    Marland Kitchen ipratropium-albuterol (DUONEB) 0.5-2.5 (3) MG/3ML SOLN Take 3 mLs by nebulization every 4 (four) hours as needed (wheezing/cough/sob). 360 mL 1  . MELATONIN PO Take by mouth.    . Misc Natural Products (ESTROVEN ENERGY PO) Take by mouth.    . Multiple Vitamin (MULTIVITAMIN) capsule Take 1 capsule by mouth daily.    . pantoprazole (PROTONIX) 40 MG tablet TAKE 1 TABLET(40 MG) BY MOUTH DAILY 90 tablet 0  . promethazine (PHENERGAN) 25 MG tablet Take 1 tablet (25 mg total) by mouth every 6 (six) hours as needed for nausea or vomiting. 30 tablet 0  . tiZANidine (ZANAFLEX) 4 MG tablet Take 1/2 to 1 tablet by mouth every 6 hours as needed for muscle spasms.  NEED FOLLOW UP APPOINTMENT FOR MORE REFILLS 60 tablet 0  .  triamterene-hydrochlorothiazide (MAXZIDE-25) 37.5-25 MG tablet Take 1 tablet by mouth daily. 90 tablet 1  . valACYclovir (VALTREX) 500 MG tablet Take 1 tablet (500 mg total) by mouth 2 (two) times daily. (Patient taking differently: Take 500 mg by mouth daily. ) 10 tablet 0   No current facility-administered medications for this visit.    Allergies  Allergen Reactions  . Adhesive [Tape] Other (See Comments)    Blisters, paper tape is worse  . Celecoxib Nausea And Vomiting  . Doxycycline     Double vision   . Erythromycin Nausea And Vomiting  . Lyrica [Pregabalin]     Induced lactation  . Morphine And Related     Decrease bp  . Penicillins Hives    Has patient had a PCN reaction causing immediate rash, facial/tongue/throat swelling, SOB or lightheadedness with hypotension: Yes Has patient had a PCN reaction causing severe rash involving mucus membranes or skin necrosis: No Has patient had a PCN reaction that required hospitalization No Has patient had a PCN reaction occurring within the last 10 years: Yes If all of the above answers are "NO", then may proceed with Cephalosporin use.   . Latex Rash       Objective:  BP 123/85   Pulse 78   Wt 255 lb (115.7 kg)   LMP 07/28/2015   SpO2 96%   BMI 37.66 kg/m  Gen: well-groomed, cooperative, not ill-appearing, no distress HEENT: normal conjunctiva, left TM with scarring, right TM clear, nasal mucosa edematous, oropharynx clear, no tonsillar exudates, uvula midline, moist mucus membranes, no frontal or maxillary sinus tenderness, neck supple, trachea midline Pulm: Normal work of breathing, normal phonation, clear to auscultation bilaterally, no wheezes, rales or rhonchi CV: Normal rate, regular rhythm, s1 and s2 distinct, no murmurs, clicks or rubs  Neuro: alert and oriented x 3, EOM's intact, no tremor MSK: moving all extremities, normal gait and station, no peripheral edema Lymph: no cervical or tonsillar adenopathy Skin: warm,  dry, intact; no rashes or lesions on exposed skin, no cyanosis   No results found for this or any previous visit (from the past 72 hour(s)). Mr Jeri Cos Wo Contrast  Result Date: 11/23/2016 CLINICAL DATA:  36 y/o F; 15 years of chronic migraines and family history of brain aneurysm. EXAM: MRI HEAD WITHOUT AND WITH CONTRAST TECHNIQUE: Multiplanar, multiecho pulse sequences of the brain and surrounding structures were obtained without and with intravenous contrast. CONTRAST:  57mL MULTIHANCE GADOBENATE DIMEGLUMINE 529 MG/ML IV SOLN COMPARISON:  None. FINDINGS: Brain: No acute infarction, hemorrhage, hydrocephalus, extra-axial collection or mass lesion. No abnormal enhancement of the brain. Vascular: Persistent central flow voids.  Skull and upper cervical spine: Normal marrow signal. Sinuses/Orbits: No abnormal signal of paranasal sinuses or orbits. Small right mastoid air cell effusion. Other: None. IMPRESSION: 1. Normal MRI of the brain with and without contrast. 2. Small right mastoid effusion. 3. MRA of the head without contrast or CTA of the head with contrast can be obtained to evaluate for intracranial aneurysm as clinically indicated. Electronically Signed   By: Kristine Garbe M.D.   On: 11/23/2016 14:46      Assessment and Plan: 36 y.o. female with   1. Moderate persistent chronic asthma with acute exacerbation - no appreciable wheezes on exam today, but patient has been using rescue inhaler frequently - refilled Duoneb and started Singulair - cont antihistamine daily - encouraged smoking cessation - ipratropium-albuterol (DUONEB) 0.5-2.5 (3) MG/3ML SOLN; Take 3 mLs by nebulization every 4 (four) hours as needed (wheezing/cough/sob).  Dispense: 360 mL; Refill: 1 - montelukast (SINGULAIR) 10 MG tablet; Take 1 tablet (10 mg total) by mouth at bedtime.  Dispense: 90 tablet; Refill: 3  Patient education and anticipatory guidance given Patient agrees with treatment plan Follow-up  with PCP in 2 weeks or sooner as needed if symptoms worsen or fail to improve  Darlyne Russian PA-C

## 2016-11-26 NOTE — Patient Instructions (Addendum)
- Duoneb every 6 hours as needed for wheezing. Contains same active ingredient as your rescue inhaler, so must use one or the other or dose them 4-6 hours apart.  - Start Singulair 1 tablet nightly - Avoid smoking and secondhand smoke - Continue your antihistamine daily - Follow-up with PCP in 2 weeks   Bronchospasm, Adult Bronchospasm is a tightening of the airways going into the lungs. During an episode, it may be harder to breathe. You may cough, and you may make a whistling sound when you breathe (wheeze). This condition often affects people with asthma. What are the causes? This condition is caused by swelling and irritation in the airways. It can be triggered by:  An infection (common).  Seasonal allergies.  An allergic reaction.  Exercise.  Irritants. These include pollution, cigarette smoke, strong odors, aerosol sprays, and paint fumes.  Weather changes. Winds increase molds and pollens in the air. Cold air may cause swelling.  Stress and emotional upset. What are the signs or symptoms? Symptoms of this condition include:  Wheezing. If the episode was triggered by an allergy, wheezing may start right away or hours later.  Nighttime coughing.  Frequent or severe coughing with a simple cold.  Chest tightness.  Shortness of breath.  Decreased ability to exercise. How is this diagnosed? This condition is usually diagnosed with a review of your medical history and a physical exam. Tests, such as lung function tests, are sometimes done to look for other conditions. The need for a chest X-ray depends on where the wheezing occurs and whether it is the first time you have wheezed. How is this treated? This condition may be treated with:  Inhaled medicines. These open up the airways and help you breathe. They can be taken with an inhaler or a nebulizer device.  Corticosteroid medicines. These may be given for severe bronchospasm, usually when it is associated with  asthma.  Avoiding triggers, such as irritants, infection, or allergies. Follow these instructions at home: Medicines   Take over-the-counter and prescription medicines only as told by your health care provider.  If you need to use an inhaler or nebulizer to take your medicine, ask your health care provider to explain how to use it correctly. If you were given a spacer, always use it with your inhaler. Lifestyle   Reduce the number of triggers in your home. To do this:  Change your heating and air conditioning filter at least once a month.  Limit your use of fireplaces and wood stoves.  Do not smoke. Do not allow smoking in your home.  Avoid using perfumes and fragrances.  Get rid of pests, such as roaches and mice, and their droppings.  Remove any mold from your home.  Keep your house clean and dust free. Use unscented cleaning products.  Replace carpet with wood, tile, or vinyl flooring. Carpet can trap dander and dust.  Use allergy-proof pillows, mattress covers, and box spring covers.  Wash bed sheets and blankets every week in hot water. Dry them in a dryer.  Use blankets that are made of polyester or cotton.  Wash your hands often.  Do not allow pets in your bedroom.  Avoid breathing in cold air when you exercise. General instructions   Have a plan for seeking medical care. Know when to call your health care provider and local emergency services, and where to get emergency care.  Stay up to date on your immunizations.  When you have an episode of bronchospasm, stay  calm. Try to relax and breathe more slowly.  If you have asthma, make sure you have an asthma action plan.  Keep all follow-up visits as told by your health care provider. This is important. Contact a health care provider if:  You have muscle aches.  You have chest pain.  The mucus that you cough up (sputum) changes from clear or white to yellow, green, gray, or bloody.  You have a  fever.  Your sputum gets thicker. Get help right away if:  Your wheezing and coughing get worse, even after you take your prescribed medicines.  It gets even harder to breathe.  You develop severe chest pain. Summary  Bronchospasm is a tightening of the airways going into the lungs.  During an episode of bronchospasm, you may have a harder time breathing. You may cough and make a whistling sound when you breathe (wheeze).  Avoid exposure to triggers such as smoke, dust, mold, animal dander, and fragrances.  When you have an episode of bronchospasm, stay calm. Try to relax and breathe more slowly. This information is not intended to replace advice given to you by your health care provider. Make sure you discuss any questions you have with your health care provider. Document Released: 07/16/2003 Document Revised: 07/09/2016 Document Reviewed: 07/09/2016 Elsevier Interactive Patient Education  2017 [employer].

## 2016-12-01 ENCOUNTER — Ambulatory Visit (INDEPENDENT_AMBULATORY_CARE_PROVIDER_SITE_OTHER): Payer: BLUE CROSS/BLUE SHIELD | Admitting: Physician Assistant

## 2016-12-01 ENCOUNTER — Encounter: Payer: Self-pay | Admitting: Physician Assistant

## 2016-12-01 VITALS — BP 125/82 | HR 79 | Ht 69.0 in | Wt 257.0 lb

## 2016-12-01 DIAGNOSIS — B379 Candidiasis, unspecified: Secondary | ICD-10-CM

## 2016-12-01 DIAGNOSIS — J0100 Acute maxillary sinusitis, unspecified: Secondary | ICD-10-CM | POA: Diagnosis not present

## 2016-12-01 DIAGNOSIS — K591 Functional diarrhea: Secondary | ICD-10-CM

## 2016-12-01 DIAGNOSIS — J4540 Moderate persistent asthma, uncomplicated: Secondary | ICD-10-CM | POA: Diagnosis not present

## 2016-12-01 MED ORDER — BENZONATATE 200 MG PO CAPS: 200.0000 mg | ORAL_CAPSULE | Freq: Two times a day (BID) | ORAL | 0 refills | Status: DC | PRN

## 2016-12-01 MED ORDER — FLUCONAZOLE 150 MG PO TABS: 150.0000 mg | ORAL_TABLET | Freq: Every day | ORAL | 0 refills | Status: DC

## 2016-12-01 MED ORDER — DICYCLOMINE HCL 10 MG PO CAPS: 10.0000 mg | ORAL_CAPSULE | Freq: Three times a day (TID) | ORAL | 1 refills | Status: DC

## 2016-12-01 MED ORDER — CEFDINIR 300 MG PO CAPS: 300.0000 mg | ORAL_CAPSULE | Freq: Two times a day (BID) | ORAL | 0 refills | Status: DC

## 2016-12-01 MED ORDER — METHYLPREDNISOLONE SODIUM SUCC 125 MG IJ SOLR
125.0000 mg | Freq: Once | INTRAMUSCULAR | Status: AC
  Administered 2016-12-01: 125 mg via INTRAMUSCULAR

## 2016-12-01 NOTE — Progress Notes (Signed)
Subjective:    Patient ID: Margaret Lawson, female    DOB: 1980-09-18, 36 y.o.   MRN: [id number]  HPI  Pt is a 36 yo female with a hx of asthma that presents to the clinic with residual SOB, wheezing, sinus pressure, cough, ear pain, ST for 2 to 3 weeks. She was seen a few weeks ago by Nelson Chimes PA and told to start singular, given atrovent, and duoneb solultion. She did both and helped some but she continues to have symptoms. She is using duoneb 1 to 2 times a day. She does continue to smoke. She now has green to yellow discharge coming out of nose. She is having a lot of sinus pressure.    She also complains of diarrhea off and on. She noticed certain foods do make better and worse. She can have up to 4 loose stools a day but not every day. She does have some abdominal pain and cramping but usually relieved by bowel movement. Denies any melena or hematochezia. She has had for years. She was given at one point but cannot remember what it was.   Review of Systems See HPI.     Objective:   Physical Exam  Constitutional: She is oriented to person, place, and time. She appears well-developed and well-nourished.  HENT:  Head: Normocephalic and atraumatic.  Right Ear: External ear normal.  Left Ear: External ear normal.  Nose: Nose normal.  Mouth/Throat: Oropharynx is clear and moist.  Right TM fluid behind TM>  Left TM clear.  Tenderness over maxillary and frontal sinuses.  Swollen and red turbinates.   Eyes: Conjunctivae are normal. Right eye exhibits no discharge. Left eye exhibits no discharge.  Neck: Normal range of motion. Neck supple.  Cardiovascular: Normal rate, regular rhythm and normal heart sounds.   Pulmonary/Chest: Effort normal.  Inspiratory wheezing more left than right. No rhonchi.   Abdominal: Soft. Bowel sounds are normal. She exhibits no distension and no mass. There is no tenderness. There is no rebound and no guarding.  Lymphadenopathy:    She has  cervical adenopathy.  Neurological: She is alert and oriented to person, place, and time.  Psychiatric: She has a normal mood and affect. Her behavior is normal.          Assessment & Plan:  Marland KitchenMarland KitchenJohnnae was seen today for cough and asthma.  Diagnoses and all orders for this visit:  Acute non-recurrent maxillary sinusitis -     benzonatate (TESSALON) 200 MG capsule; Take 1 capsule (200 mg total) by mouth 2 (two) times daily as needed for cough. -     cefdinir (OMNICEF) 300 MG capsule; Take 1 capsule (300 mg total) by mouth 2 (two) times daily. For 10 days -     methylPREDNISolone sodium succinate (SOLU-MEDROL) 125 mg/2 mL injection 125 mg; Inject 2 mLs (125 mg total) into the muscle once.  Functional diarrhea -     dicyclomine (BENTYL) 10 MG capsule; Take 1 capsule (10 mg total) by mouth 3 (three) times daily before meals.  Moderate persistent asthma without complication -     methylPREDNISolone sodium succinate (SOLU-MEDROL) 125 mg/2 mL injection 125 mg; Inject 2 mLs (125 mg total) into the muscle once.  Yeast infection -     fluconazole (DIFLUCAN) 150 MG tablet; Take 1 tablet (150 mg total) by mouth daily. Repeat in 48-72 hours as needed.   Asthma exacerbation has turned into to a sinus infection. Diflucan given due to yeast infections after abx use.  Some wheezing heard on lung exam. Solumedrol 125mg  was given.  Suspect IBS. Bentyl given to try. Follow up if no improvement. Food triggers to be avoided. Consider priobiotic daily.

## 2016-12-01 NOTE — Patient Instructions (Signed)
Viberzi  Eluxadoline oral tablets What is this medicine? ELUXADOLINE (ee LUX ah dol ine) is an intestinal disorder drug. It is used to treat irritable bowel syndrome with diarrhea. This medicine may be used for other purposes; ask your health care provider or pharmacist if you have questions. COMMON BRAND NAME(S): Viberzi What should I tell my health care provider before I take this medicine? They need to know if you have any of these conditions: -drink more than 3 alcohol-containing drinks per day -gallbladder disease -have no gallbladder -history of constipation -history of drug or alcohol abuse problems -history of pancreatitis or pancreatic disease -liver disease -stomach or intestine problems -an unusual or allergic reaction to eluxadoline, other medicines, foods, dyes, or preservatives -pregnant or trying to get pregnant -breast-feeding How should I use this medicine? Take this medicine by mouth with a glass of water. Follow the directions on the prescription label. Take this medicine with food. Take your medicine at regular intervals. Do not take it more often than directed. Do not stop taking except on your doctor's advice. Talk to your pediatrician regarding the use of this medicine in children. Special care may be needed. Overdosage: If you think you have taken too much of this medicine contact a poison control center or emergency room at once. NOTE: This medicine is only for you. Do not share this medicine with others. What if I miss a dose? If you miss a dose, take it as soon as you can. If it is almost time for your next dose, take only that dose. Do not take double or extra doses. What may interact with this medicine? This medicine may interact with the following medications: -alosetron -anticholinergics -bupropion -certain antibiotics like ciprofloxacin, clarithromycin, rifampin -cyclosporine -eltrombopag -ergot alkaloids like dihydroergotamine, ergonovine,  ergotamine, methylergonovine -fluconazole -gemfibrozil -loperamide -opiate agonist -paroxetine -pimozide -probenecid -quinidine -rosuvastatin -sirolimus -tacrolimus This list may not describe all possible interactions. Give your health care provider a list of all the medicines, herbs, non-prescription drugs, or dietary supplements you use. Also tell them if you smoke, drink alcohol, or use illegal drugs. Some items may interact with your medicine. What should I watch for while using this medicine? Tell your doctor or healthcare professional if your symptoms do not start to get better or if they get worse. This medicine may cause constipation. If you do not have a bowel movement, call your doctor or healthcare professional. You may get drowsy or dizzy. Do not drive, use machinery, or do anything that needs mental alertness until you know how this medicine affects you. Do not stand or sit up quickly, especially if you are an older patient. This reduces the risk of dizzy or fainting spells. Alcohol may interfere with the effect of this medicine. Avoid alcoholic drinks. What side effects may I notice from receiving this medicine? Side effects that you should report to your doctor or health care professional as soon as possible: -allergic reactions like skin rash, itching or hives, swelling of the face, lips, or tongue -breathing problems -constipation -right upper belly pain Side effects that usually do not require medical attention (report to your doctor or health care professional if they continue or are bothersome): -dizziness -nausea, vomiting -tiredness This list may not describe all possible side effects. Call your doctor for medical advice about side effects. You may report side effects to FDA at 1-800-FDA-1088. Where should I keep my medicine? Keep out of the reach of children. Store at room temperature between 20 and 25 degrees C (  68 and 77 degrees F). Throw away any unused  medicine after the expiration date. NOTE: This sheet is a summary. It may not cover all possible information. If you have questions about this medicine, talk to your doctor, pharmacist, or health care provider.  2018 Elsevier/Gold Standard (2015-10-10 11:03:55)

## 2016-12-03 ENCOUNTER — Encounter: Payer: Self-pay | Admitting: Physician Assistant

## 2016-12-03 DIAGNOSIS — J0100 Acute maxillary sinusitis, unspecified: Secondary | ICD-10-CM | POA: Insufficient documentation

## 2016-12-03 DIAGNOSIS — K591 Functional diarrhea: Secondary | ICD-10-CM | POA: Insufficient documentation

## 2016-12-03 DIAGNOSIS — J4540 Moderate persistent asthma, uncomplicated: Secondary | ICD-10-CM | POA: Insufficient documentation

## 2016-12-07 IMAGING — US US TRANSVAGINAL NON-OB
1 series · 15 of 25 positions shown · non-contrast
Comparison: None in PACs

CLINICAL DATA: History of menometrorrhagia, previous history of
amenorrhea, history of polycystic ovarian syndrome, obesity. Onset
of last normal menstrual period was May 28, 2015.

EXAM:
TRANSABDOMINAL AND TRANSVAGINAL ULTRASOUND OF PELVIS
TECHNIQUE: Both transabdominal and transvaginal ultrasound examinations of the
pelvis were performed. Transabdominal technique was performed for
global imaging of the pelvis including uterus, ovaries, adnexal
regions, and pelvic cul-de-sac. It was necessary to proceed with
endovaginal exam following the transabdominal exam to visualize the
endometrium and ovaries..

[Series 1: us transvaginal non-ob · 15 of 40 slices shown]
[im 1/40]
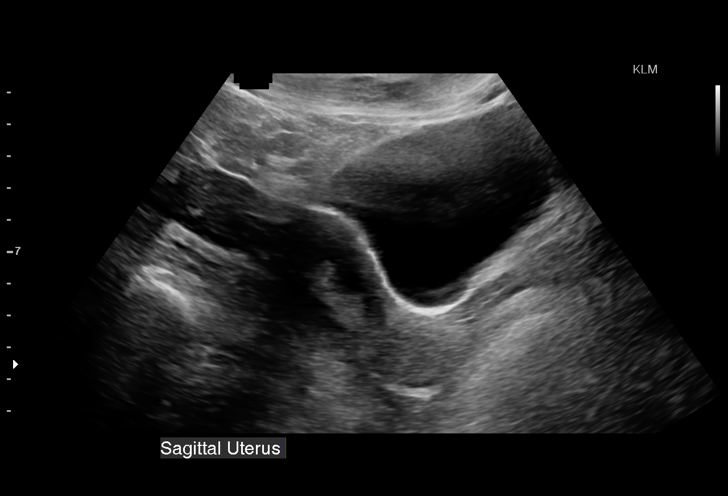
[im 4/40]
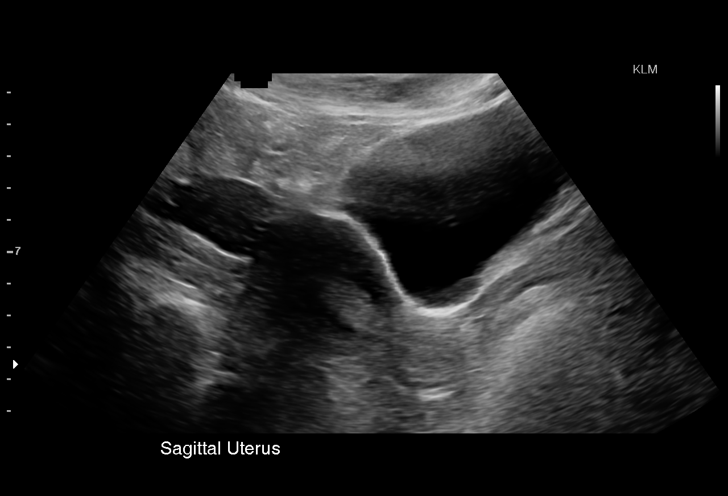
[im 7/40]
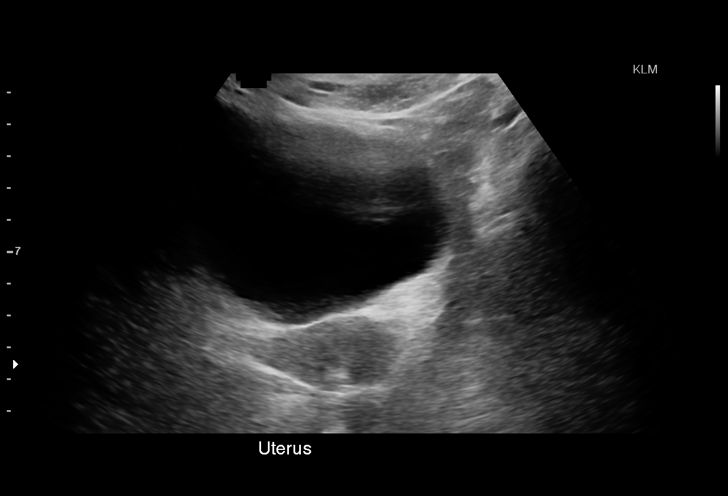
[im 9/40]
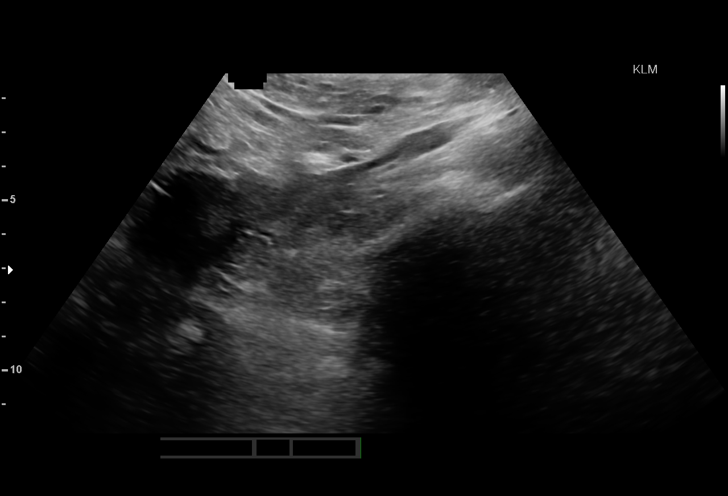
[im 12/40]
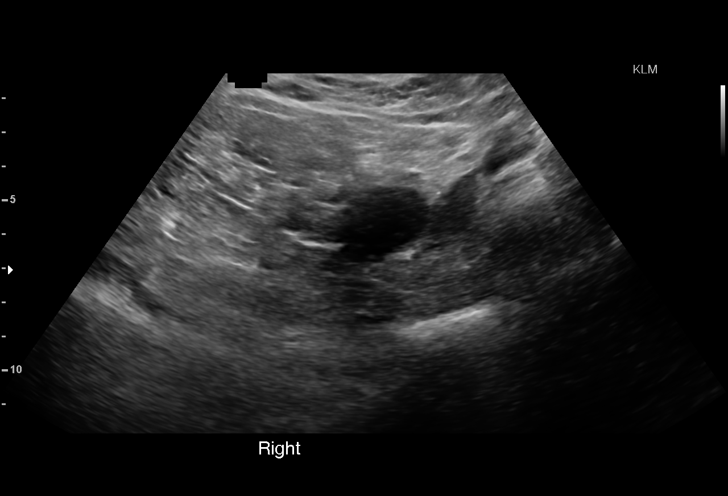
[im 15/40]
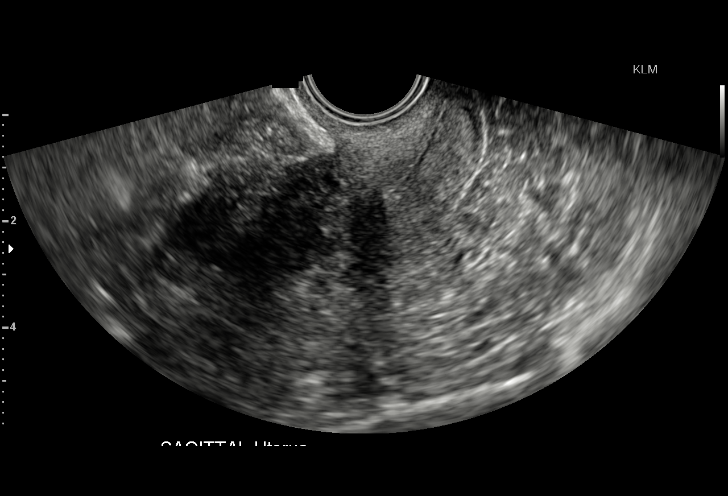
[im 17/40]
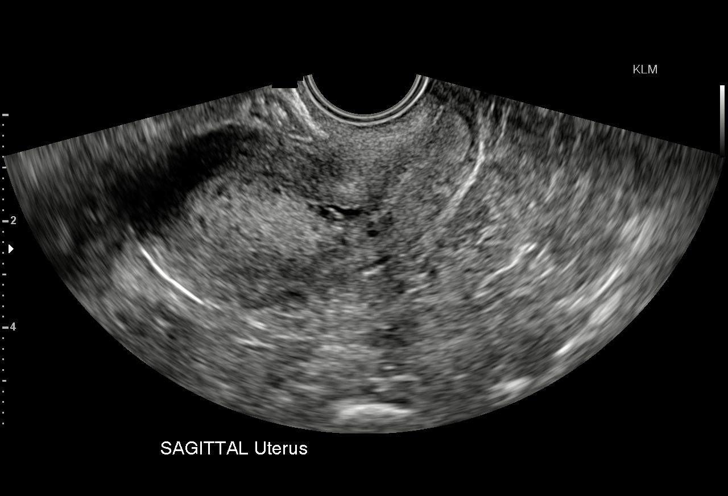
[im 20/40]
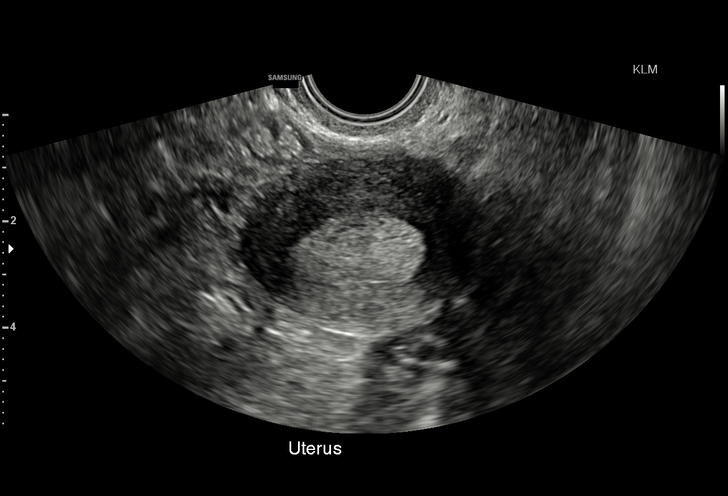
[im 23/40]
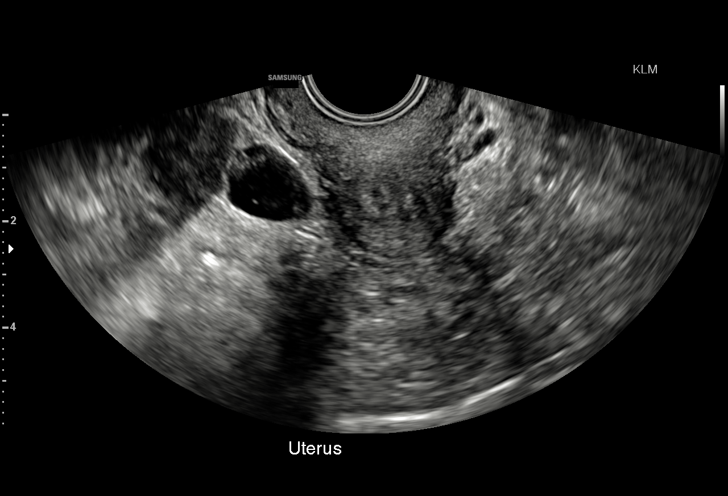
[im 25/40]
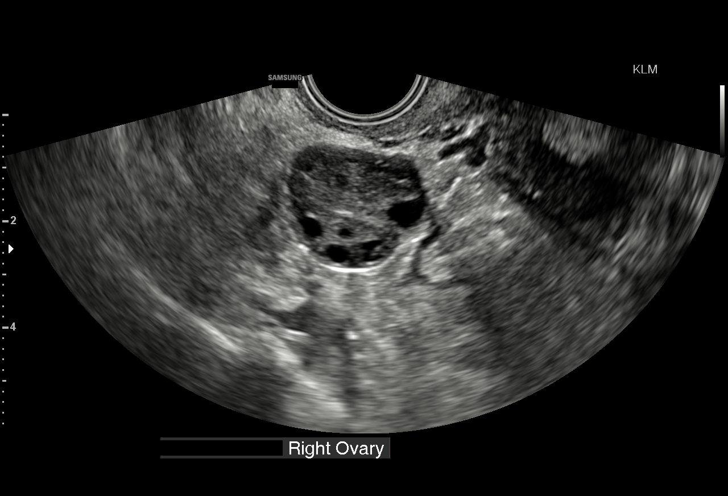
[im 28/40]
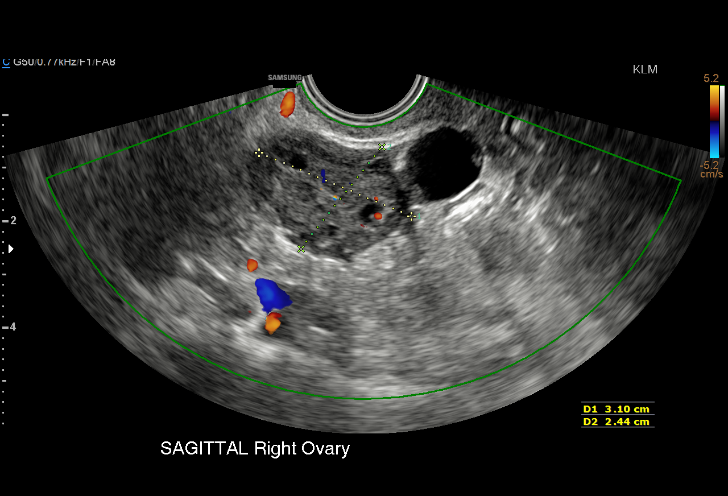
[im 31/40]
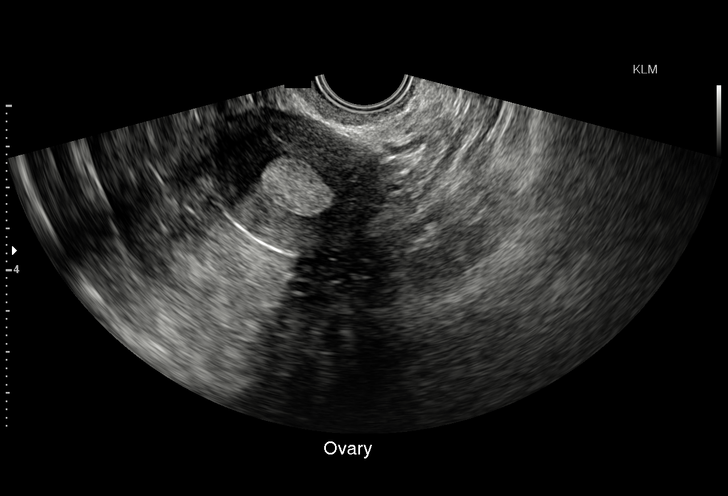
[im 33/40]
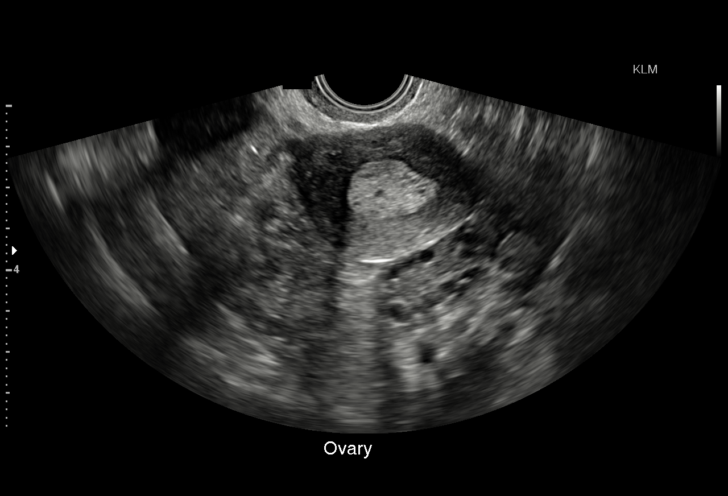
[im 36/40]
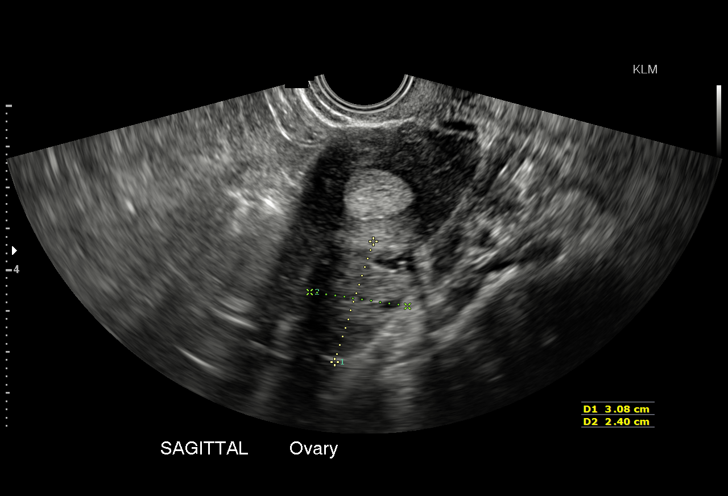
[im 40/40]
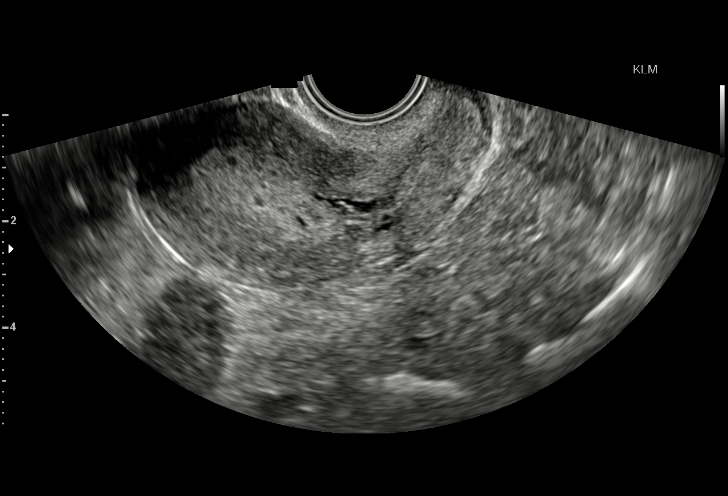

[15 of 25 positions shown; findings below may reference images not displayed]

FINDINGS: Uterus

Measurements: 6.8 x 3.5 x 4.6 cm. No fibroids or other mass
visualized.

Endometrium

Thickness: 14 mm. Within the endometrium there are multiple tiny
anechoic cystic appearing regions.

Right ovary

Measurements: 3.1 x 2.4 x 2.8 cm. There is an exophytic right
ovarian cyst measuring 1.6 cm. There are no findings suspicious for
polycystic ovarian syndrome.

Left ovary

Measurements: 3.1 x 2.4 x 2.4 cm.. Normal appearance/no adnexal
mass.

Other findings

No free fluid.
IMPRESSION: 1. The uterus is normal in contour with no evidence of fibrosis. The
endometrial stripe is mildly thickened. The patient is over to her
menstrual period. There are tiny cystic areas present within the
endometrium. There is no evidence of a gestational sac. Correlation
with patient's beta HCG is needed. If bleeding remains unresponsive
to hormonal or medical therapy, sonohysterogram should be considered
for focal lesion work-up. (Ref: Radiological Reasoning: Algorithmic
Workup of Abnormal Vaginal Bleeding with Endovaginal Sonography and
Sonohysterography. AJR [id number]; [id number]:[id number])
2. Exophytic 1.6 cm right ovarian cyst. The echotexture of both the
right and left ovaries is otherwise unremarkable.

## 2016-12-08 ENCOUNTER — Ambulatory Visit: Payer: Self-pay | Admitting: Physician Assistant

## 2016-12-16 ENCOUNTER — Emergency Department (INDEPENDENT_AMBULATORY_CARE_PROVIDER_SITE_OTHER): Payer: BLUE CROSS/BLUE SHIELD

## 2016-12-16 ENCOUNTER — Emergency Department (INDEPENDENT_AMBULATORY_CARE_PROVIDER_SITE_OTHER)
Admission: EM | Admit: 2016-12-16 | Discharge: 2016-12-16 | Disposition: A | Discharge status: Home / Self Care | Payer: BLUE CROSS/BLUE SHIELD | Source: Home / Self Care | Attending: Family Medicine | Admitting: Family Medicine

## 2016-12-16 DIAGNOSIS — M25571 Pain in right ankle and joints of right foot: Secondary | ICD-10-CM

## 2016-12-16 DIAGNOSIS — M79671 Pain in right foot: Secondary | ICD-10-CM

## 2016-12-16 NOTE — ED Provider Notes (Signed)
CSN: [id number]     Arrival date & time 12/16/16  1441 History   First MD Initiated Contact with Patient 12/16/16 1459     Chief Complaint  Patient presents with  . Foot Pain   (Consider location/radiation/quality/duration/timing/severity/associated sxs/prior Treatment) HPI  Margaret Lawson is a 36 y.o. female presenting to UC with c/o Right foot pain that started earlier today around 12PM after accidentally stepping on her dog's toy.  Pain is aching and sore, 4/10, worse with walking barefoot but better when walking in shoes.  She did take tylenol around 1330.  She is unsure if she rolled her ankle a little during the incident. She notes two family members have had stress fractures in their feet so she is concerned she may have one too.    Past Medical History:  Diagnosis Date  . Anxiety    severe  . Asthma, chronic 12/27/2013  . Bipolar 2 disorder ([place])   . Essential hypertension, benign 12/27/2013  . Fibromyalgia 12/27/2013  . GERD (gastroesophageal reflux disease)   . Headache    migraines, over stimulation  . Leukocytosis 12/27/2013  . Low iron   . PCOS (polycystic ovarian syndrome)   . Perforation of left tympanic membrane 12/27/2013  . Poor dentition    right side upper and lower  . Restless leg syndrome   . Right leg pain   . Teeth clenching   . Teeth grinding   . Weakness    when exposed to hot or cold temperatures   Past Surgical History:  Procedure Laterality Date  . CHOLECYSTECTOMY    . ROBOTIC ASSISTED TOTAL HYSTERECTOMY WITH BILATERAL SALPINGO OOPHERECTOMY Bilateral 09/10/2015   Procedure: ROBOTIC ASSISTED TOTAL HYSTERECTOMY WITH BILATERAL SALPINGECTOMY;  Surgeon: Lavonia Drafts, MD;  Location: [facility];  Service: Gynecology;  Laterality: Bilateral;  . WISDOM TOOTH EXTRACTION     Family History  Problem Relation Age of Onset  . Aneurysm Mother        brain  . Depression Father   . Heart disease Father   . Mitral valve prolapse Father   . Diabetes Father    . Hypertension Father   . Depression Sister   . Breast cancer Paternal Grandfather   . Breast cancer Paternal Aunt    Social History  Substance Use Topics  . Smoking status: Current Every Day Smoker    Packs/day: 1.00    Years: 20.00    Types: Cigarettes  . Smokeless tobacco: Never Used     Comment: uses 6mg  vapes  . Alcohol use No   OB History    Gravida Para Term Preterm AB Living   0 0 0 0 0 0   SAB TAB Ectopic Multiple Live Births   0 0 0 0       Review of Systems  Musculoskeletal: Positive for arthralgias and myalgias. Negative for gait problem and joint swelling.  Skin: Negative for color change and wound.  Neurological: Negative for weakness and numbness.    Allergies  Adhesive [tape]; Celecoxib; Doxycycline; Erythromycin; Lyrica [pregabalin]; Morphine and related; Penicillins; and Latex  Home Medications   Prior to Admission medications   Medication Sig Start Date End Date Taking? Authorizing Provider  acetaminophen (TYLENOL) 500 MG tablet Take 500 mg by mouth every 6 (six) hours as needed for mild pain or moderate pain.    [provider]  albuterol (PROVENTIL HFA;VENTOLIN HFA) 108 (90 Base) MCG/ACT inhaler Inhale 2 puffs into the lungs every 4 (four) hours as needed for wheezing.  04/23/16   Silverio Decamp, MD  albuterol (PROVENTIL) (2.5 MG/3ML) 0.083% nebulizer solution Take 3 mLs (2.5 mg total) by nebulization every 4 (four) hours as needed for wheezing. 06/07/14   Jacqulyn Cane, MD  ARIPiprazole (ABILIFY) 5 MG tablet Take 1/2 tablet for 5 days then increase to one tablet daily. 11/16/16   Breeback, Jade L, PA-C  atenolol (TENORMIN) 100 MG tablet Take 1 tablet (100 mg total) by mouth daily. 10/08/16   Breeback, Jade L, PA-C  benzonatate (TESSALON) 200 MG capsule Take 1 capsule (200 mg total) by mouth 2 (two) times daily as needed for cough. 12/01/16   Breeback, Jade L, PA-C  CALCIUM-VITAMIN D PO Take by mouth.    [provider]  cefdinir  (OMNICEF) 300 MG capsule Take 1 capsule (300 mg total) by mouth 2 (two) times daily. For 10 days 12/01/16   Donella Stade, PA-C  dicyclomine (BENTYL) 10 MG capsule Take 1 capsule (10 mg total) by mouth 3 (three) times daily before meals. 12/01/16   Breeback, Jade L, PA-C  fluconazole (DIFLUCAN) 150 MG tablet Take 1 tablet (150 mg total) by mouth daily. Repeat in 48-72 hours as needed. 12/01/16   Breeback, Jade L, PA-C  ipratropium-albuterol (DUONEB) 0.5-2.5 (3) MG/3ML SOLN Take 3 mLs by nebulization every 4 (four) hours as needed (wheezing/cough/sob). 11/26/16   Trixie Dredge, PA-C  MELATONIN PO Take by mouth.    [provider]  Misc Natural Products (ESTROVEN ENERGY PO) Take by mouth.    [provider]  montelukast (SINGULAIR) 10 MG tablet Take 1 tablet (10 mg total) by mouth at bedtime. 11/26/16   Trixie Dredge, PA-C  Multiple Vitamin (MULTIVITAMIN) capsule Take 1 capsule by mouth daily.    [provider]  pantoprazole (PROTONIX) 40 MG tablet TAKE 1 TABLET(40 MG) BY MOUTH DAILY 09/22/16   Silverio Decamp, MD  promethazine (PHENERGAN) 25 MG tablet Take 1 tablet (25 mg total) by mouth every 6 (six) hours as needed for nausea or vomiting. 06/14/15   Emeterio Reeve, DO  tiZANidine (ZANAFLEX) 4 MG tablet Take 1/2 to 1 tablet by mouth every 6 hours as needed for muscle spasms.  NEED FOLLOW UP APPOINTMENT FOR MORE REFILLS 04/07/16   Donella Stade, PA-C  triamterene-hydrochlorothiazide (MAXZIDE-25) 37.5-25 MG tablet Take 1 tablet by mouth daily. 10/05/16   Breeback, Royetta Car, PA-C  valACYclovir (VALTREX) 500 MG tablet Take 1 tablet (500 mg total) by mouth 2 (two) times daily. Patient taking differently: Take 500 mg by mouth daily.  06/17/15   Guss Bunde, MD   Meds Ordered and Administered this Visit  Medications - No data to display  BP 123/85 (BP Location: Left Arm)   Pulse 89   Temp 98.7 F (37.1 C) (Oral)   Resp 18   Wt 255 lb  (115.7 kg)   LMP 07/28/2015   SpO2 97%   BMI 37.66 kg/m  No data found.   Physical Exam  Constitutional: She is oriented to person, place, and time. She appears well-developed and well-nourished. No distress.  HENT:  Head: Normocephalic and atraumatic.  Eyes: EOM are normal.  Neck: Normal range of motion.  Cardiovascular: Normal rate.   Pulmonary/Chest: Effort normal.  Musculoskeletal: Normal range of motion. She exhibits tenderness. She exhibits no edema.  Right foot: mild tenderness to mid foot on plantar and dorsal aspect. Full ROM ankle and toes. No obvious edema.   Neurological: She is alert and oriented to person, place,  and time.  Skin: Skin is warm and dry. She is not diaphoretic.  Right foot and ankle: skin in tact. No ecchymosis or erythema.  Psychiatric: She has a normal mood and affect. Her behavior is normal.  Nursing note and vitals reviewed.   Urgent Care Course     Procedures (including critical care time)  Labs Review Labs Reviewed - No data to display  Imaging Review Dg Ankle Complete Right  Result Date: 12/16/2016 CLINICAL DATA:  Stepped on a dog toy this morning and rolled ankle. Mid foot and ankle pain.Initial encounter. EXAM: RIGHT ANKLE - COMPLETE 3+ VIEW COMPARISON:  None. FINDINGS: There is no evidence of fracture, dislocation, or joint effusion. There is no evidence of arthropathy or other focal bone abnormality. Soft tissues are unremarkable. IMPRESSION: Negative. Electronically Signed   By: Monte Fantasia M.D.   On: 12/16/2016 15:39   Dg Foot Complete Right  Result Date: 12/16/2016 CLINICAL DATA:  Stepped on a dog toy this morning and rolled ankle. Mid foot and ankle pain.Initial encounter. EXAM: RIGHT FOOT COMPLETE - 3+ VIEW COMPARISON:  None. FINDINGS: There is no evidence of fracture or dislocation. There is no evidence of arthropathy or other focal bone abnormality. Soft tissues are unremarkable. IMPRESSION: Negative. Electronically Signed    By: Monte Fantasia M.D.   On: 12/16/2016 15:39      MDM   1. Acute right ankle pain   2. Right foot pain    Pt c/o Right ankle pain while down in imaging. Right ankle image also ordered.  Plain films: negative for acute bony injury Reassured pt. Encouraged rest, ice, compression and elevation Alternate acetaminophen and ibuprofen. F/u with PCP in 1-2 weeks as needed.     Noland Fordyce, PA-C 12/16/16 [phone]

## 2016-12-16 NOTE — ED Triage Notes (Signed)
Pt stepped on dog toy with right foot around 1200, took tylenol at 1330.

## 2016-12-21 ENCOUNTER — Encounter: Payer: Self-pay | Admitting: Physician Assistant

## 2016-12-21 DIAGNOSIS — F3181 Bipolar II disorder: Secondary | ICD-10-CM

## 2016-12-22 MED ORDER — ARIPIPRAZOLE 5 MG PO TABS
5.0000 mg | ORAL_TABLET | Freq: Every day | ORAL | 1 refills | Status: DC
Start: 2016-12-22 — End: 2018-02-07

## 2016-12-28 ENCOUNTER — Ambulatory Visit: Payer: Self-pay | Admitting: Obstetrics & Gynecology

## 2016-12-28 ENCOUNTER — Encounter: Payer: Self-pay | Admitting: Physician Assistant

## 2016-12-28 ENCOUNTER — Encounter: Payer: Self-pay | Admitting: Obstetrics & Gynecology

## 2016-12-28 DIAGNOSIS — Z09 Encounter for follow-up examination after completed treatment for conditions other than malignant neoplasm: Secondary | ICD-10-CM

## 2016-12-28 MED ORDER — PANTOPRAZOLE SODIUM 40 MG PO TBEC: DELAYED_RELEASE_TABLET | ORAL | 4 refills | Status: DC

## 2016-12-29 ENCOUNTER — Other Ambulatory Visit: Payer: Self-pay | Admitting: Obstetrics & Gynecology

## 2017-01-07 ENCOUNTER — Other Ambulatory Visit: Payer: Self-pay | Admitting: Obstetrics & Gynecology

## 2017-01-07 DIAGNOSIS — E8940 Asymptomatic postprocedural ovarian failure: Secondary | ICD-10-CM

## 2017-01-07 DIAGNOSIS — Z79890 Hormone replacement therapy (postmenopausal): Principal | ICD-10-CM

## 2017-01-07 MED ORDER — ESTRADIOL 0.05 MG/24HR TD PTWK: 0.0500 mg | MEDICATED_PATCH | TRANSDERMAL | 12 refills | Status: DC

## 2017-01-12 ENCOUNTER — Ambulatory Visit: Payer: BLUE CROSS/BLUE SHIELD | Admitting: Physician Assistant

## 2017-03-17 ENCOUNTER — Other Ambulatory Visit: Payer: Self-pay | Admitting: Physician Assistant

## 2017-03-24 ENCOUNTER — Other Ambulatory Visit: Payer: Self-pay

## 2017-03-24 MED ORDER — TRIAMTERENE-HCTZ 37.5-25 MG PO TABS: 1.0000 | ORAL_TABLET | Freq: Every day | ORAL | 0 refills | Status: DC

## 2017-04-21 ENCOUNTER — Telehealth: Payer: Self-pay | Admitting: *Deleted

## 2017-04-21 DIAGNOSIS — B009 Herpesviral infection, unspecified: Secondary | ICD-10-CM

## 2017-04-21 MED ORDER — VALACYCLOVIR HCL 500 MG PO TABS: 500.0000 mg | ORAL_TABLET | Freq: Every day | ORAL | 1 refills | Status: DC

## 2017-04-21 NOTE — Telephone Encounter (Signed)
Rf request received from [facility] for Valcyclovir 500 mg daily authorized and returned to pharmacy.

## 2017-04-30 ENCOUNTER — Encounter: Payer: Self-pay | Admitting: Physician Assistant

## 2017-04-30 DIAGNOSIS — B009 Herpesviral infection, unspecified: Secondary | ICD-10-CM

## 2017-04-30 DIAGNOSIS — I10 Essential (primary) hypertension: Secondary | ICD-10-CM

## 2017-04-30 MED ORDER — VALACYCLOVIR HCL 500 MG PO TABS: 500.0000 mg | ORAL_TABLET | Freq: Every day | ORAL | 1 refills | Status: DC

## 2017-04-30 MED ORDER — ATENOLOL 100 MG PO TABS: 100.0000 mg | ORAL_TABLET | Freq: Every day | ORAL | 0 refills | Status: DC

## 2017-11-16 ENCOUNTER — Other Ambulatory Visit: Payer: Self-pay | Admitting: Physician Assistant

## 2017-11-16 DIAGNOSIS — I10 Essential (primary) hypertension: Secondary | ICD-10-CM

## 2017-12-29 ENCOUNTER — Other Ambulatory Visit: Payer: Self-pay | Admitting: Physician Assistant

## 2017-12-29 DIAGNOSIS — I10 Essential (primary) hypertension: Secondary | ICD-10-CM

## 2018-02-07 ENCOUNTER — Encounter: Payer: Self-pay | Admitting: Physician Assistant

## 2018-02-07 ENCOUNTER — Ambulatory Visit (INDEPENDENT_AMBULATORY_CARE_PROVIDER_SITE_OTHER): Payer: Self-pay | Admitting: Physician Assistant

## 2018-02-07 VITALS — BP 156/82 | HR 61 | Wt 240.0 lb

## 2018-02-07 DIAGNOSIS — M797 Fibromyalgia: Secondary | ICD-10-CM

## 2018-02-07 DIAGNOSIS — I10 Essential (primary) hypertension: Secondary | ICD-10-CM

## 2018-02-07 DIAGNOSIS — Z79890 Hormone replacement therapy (postmenopausal): Secondary | ICD-10-CM

## 2018-02-07 DIAGNOSIS — E876 Hypokalemia: Secondary | ICD-10-CM

## 2018-02-07 DIAGNOSIS — B009 Herpesviral infection, unspecified: Secondary | ICD-10-CM

## 2018-02-07 DIAGNOSIS — E8940 Asymptomatic postprocedural ovarian failure: Secondary | ICD-10-CM

## 2018-02-07 DIAGNOSIS — R41840 Attention and concentration deficit: Secondary | ICD-10-CM

## 2018-02-07 DIAGNOSIS — F3181 Bipolar II disorder: Secondary | ICD-10-CM

## 2018-02-07 DIAGNOSIS — R5383 Other fatigue: Secondary | ICD-10-CM

## 2018-02-07 DIAGNOSIS — F411 Generalized anxiety disorder: Secondary | ICD-10-CM

## 2018-02-07 DIAGNOSIS — G43009 Migraine without aura, not intractable, without status migrainosus: Secondary | ICD-10-CM

## 2018-02-07 MED ORDER — DULOXETINE HCL 30 MG PO CPEP: 30.0000 mg | ORAL_CAPSULE | Freq: Every day | ORAL | 2 refills | Status: DC

## 2018-02-07 MED ORDER — TRIAMTERENE-HCTZ 37.5-25 MG PO TABS: 1.0000 | ORAL_TABLET | Freq: Every day | ORAL | 5 refills | Status: DC

## 2018-02-07 MED ORDER — ATENOLOL 100 MG PO TABS: 100.0000 mg | ORAL_TABLET | Freq: Every day | ORAL | 5 refills | Status: DC

## 2018-02-07 MED ORDER — ESTRADIOL 0.05 MG/24HR TD PTWK: 0.0500 mg | MEDICATED_PATCH | TRANSDERMAL | 5 refills | Status: DC

## 2018-02-07 MED ORDER — ARIPIPRAZOLE 5 MG PO TABS: 5.0000 mg | ORAL_TABLET | Freq: Every day | ORAL | 1 refills | Status: DC

## 2018-02-07 MED ORDER — VALACYCLOVIR HCL 500 MG PO TABS: 500.0000 mg | ORAL_TABLET | Freq: Every day | ORAL | 5 refills | Status: DC

## 2018-02-07 MED ORDER — POTASSIUM CHLORIDE ER 10 MEQ PO CPCR: 10.0000 meq | ORAL_CAPSULE | Freq: Two times a day (BID) | ORAL | 5 refills | Status: DC

## 2018-02-07 NOTE — Patient Instructions (Signed)
Restart cymbalta and Abilify.  Add diruretic.

## 2018-02-07 NOTE — Progress Notes (Signed)
Subjective:    Patient ID: Margaret Lawson, female    DOB: July 06, 1981, 37 y.o.   MRN: [id number]  HPI  Pt is a 37 yo female with asthma, HTN, migraines, GERD, bipolar, fibromyaglia who presents to follow up.   She went into surgical menopause 11/2015. She was on climara for a while but then stopped when she struggled to keep patches on. The pill did not seem to work as well. The hot flashes are getting worse. She wants to get back on something for these.   Pt feels like she needs more help with bipolar. She loves her job but often can't go in because she is too depressed or hurts too bad. She also has fibromyalgia and flared by her depression. She has no energy. She denies any SI/HC. She is struggling a lot with focus and attention. She feels anxious and "all over the place".   .. Active Ambulatory Problems    Diagnosis Date Noted  . History of nephrolithiasis 12/27/2013  . Asthma, chronic 12/27/2013  . Essential hypertension, benign 12/27/2013  . Fibromyalgia 12/27/2013  . Muscle spasm 12/27/2013  . GERD (gastroesophageal reflux disease) 12/27/2013  . Leukocytosis 12/27/2013  . Hyperlipidemia 01/03/2014  . Other migraine without status migrainosus, not intractable 03/07/2015  . 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 History of cholecystectomy 09/11/2010  . Ovarian cyst, right 07/15/2015  . Amenorrhea 07/15/2015  . Generalized anxiety disorder 09/10/2015  . Abnormal uterine bleeding (AUB) 09/10/2015  . Hypokalemia 09/27/2015  . 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 Surgical menopause 04/23/2016  . Cervical spondylosis without myelopathy 04/23/2016  . Nipple discharge in female 08/03/2016  . Anorectal skin tags 08/03/2016  . Loose stools 08/03/2016  . Bipolar 2 disorder, major depressive episode ([place]) 11/13/2016  . No energy 11/13/2016  . Frequent headaches 11/15/2016  . Family history of brain aneurysm 11/15/2016  . Moderate persistent asthma without  complication 09/73/5329  . Functional diarrhea 12/03/2016  . Fatigue 02/13/2018  . Inattention 02/13/2018   Resolved Ambulatory Problems    Diagnosis Date Noted  . Perforation of left tympanic membrane 12/27/2013  . Bipolar I disorder, most recent episode depressed ([place]) 10/11/2014  . Post-operative state 09/10/2015  . Acute tonsillitis 04/23/2016  . Acute non-recurrent maxillary sinusitis 12/03/2016   Past Medical History:  Diagnosis Date  . Anxiety   . Asthma, chronic 12/27/2013  . Bipolar 2 disorder ([place])   . Essential hypertension, benign 12/27/2013  . Fibromyalgia 12/27/2013  . GERD (gastroesophageal reflux disease)   . Headache   . Leukocytosis 12/27/2013  . Low iron   . PCOS (polycystic ovarian syndrome)   . Perforation of left tympanic membrane 12/27/2013  . Poor dentition   . Restless leg syndrome   . Right leg pain   . Teeth clenching   . Teeth grinding   . Weakness       Review of Systems    see HPI.  Objective:   Physical Exam  Constitutional: She is oriented to person, place, and time. She appears well-developed and well-nourished.  HENT:  Head: Normocephalic and atraumatic.  Cardiovascular: Normal rate and regular rhythm.  Pulmonary/Chest: Effort normal and breath sounds normal.  Neurological: She is alert and oriented to person, place, and time.  Psychiatric: Her behavior is normal.  Flat affect.           Assessment & Plan:  Marland KitchenMarland KitchenOnalee was seen today for menopause and fibromyalgia.  Diagnoses and all orders for this visit:  Bipolar 2 disorder, major depressive episode (HCC) -     DULoxetine (CYMBALTA) 30 MG capsule; Take 1 capsule (30 mg total) by mouth daily. -     ARIPiprazole (ABILIFY) 5 MG tablet; Take 1 tablet (5 mg total) by mouth daily. Take 1/2 tablet for 14 days then increase to one tablet daily.  HSV infection -     valACYclovir (VALTREX) 500 MG tablet; Take 1 tablet (500 mg total) by mouth daily.  Essential hypertension, benign -      atenolol (TENORMIN) 100 MG tablet; Take 1 tablet (100 mg total) by mouth daily. -     triamterene-hydrochlorothiazide (MAXZIDE-25) 37.5-25 MG tablet; Take 1 tablet by mouth daily.  Surgical menopause on hormone replacement therapy -     estradiol (CLIMARA) 0.05 mg/24hr patch; Place 1 patch (0.05 mg total) onto the skin once a week.  Generalized anxiety disorder  HSV-1 (herpes simplex virus 1) infection  Surgical menopause  Fatigue, unspecified type -     B12 and Folate Panel -     VITAMIN D 25 Hydroxy (Vit-D Deficiency, Fractures) -     TSH -     CBC -     COMPLETE METABOLIC PANEL WITH GFR  Inattention -     B12 and Folate Panel -     VITAMIN D 25 Hydroxy (Vit-D Deficiency, Fractures) -     TSH -     CBC -     COMPLETE METABOLIC PANEL WITH GFR  Hypokalemia -     potassium chloride (MICRO-K) 10 MEQ CR capsule; Take 1 capsule (10 mEq total) by mouth 2 (two) times daily.  Fibromyalgia -     DULoxetine (CYMBALTA) 30 MG capsule; Take 1 capsule (30 mg total) by mouth daily.  Migraine without aura and without status migrainosus, not intractable   .Marland Kitchen Depression screen [facility] 2/9 02/07/2018 11/13/2016  Decreased Interest 3 2  Down, Depressed, Hopeless 3 2  PHQ - 2 Score 6 4  Altered sleeping 3 1  Tired, decreased energy 3 3  Change in appetite 3 3  Feeling bad or failure about yourself  3 2  Trouble concentrating 3 3  Moving slowly or fidgety/restless 3 3  Suicidal thoughts - 0  PHQ-9 Score 24 19  Difficult doing work/chores Extremely dIfficult -   .. GAD 7 : Generalized Anxiety Score 02/07/2018 11/13/2016  Nervous, Anxious, on Edge 3 3  Control/stop worrying 3 3  Worry too much - different things 3 3  Trouble relaxing 3 3  Restless 3 3  Easily annoyed or irritable 3 3  Afraid - awful might happen 2 3  Total GAD 7 Score 20 21  Anxiety Difficulty Extremely difficult Very difficult    BP not controlled. Added back in maxide which she was not taking. Follow up in 1 month  for BMP to look at potassium at one point potassium was low.   Mood not controlled. Added cymbalta and abilify. Discussed cutting back to less hours at work. Due to pain and mood and focus. Hopefully by improving pain and mood will improve focus. Pt aware of importance of working on mood with medications. Keep follow up appts.   Marland Kitchen.Spent 30 minutes with patient and greater than 50 percent of visit spent counseling patient regarding treatment plan.

## 2018-02-13 DIAGNOSIS — R41840 Attention and concentration deficit: Secondary | ICD-10-CM | POA: Insufficient documentation

## 2018-02-13 DIAGNOSIS — R5383 Other fatigue: Secondary | ICD-10-CM | POA: Insufficient documentation

## 2018-02-22 ENCOUNTER — Ambulatory Visit: Payer: Self-pay | Admitting: Physician Assistant

## 2018-02-23 ENCOUNTER — Ambulatory Visit (INDEPENDENT_AMBULATORY_CARE_PROVIDER_SITE_OTHER): Payer: Self-pay | Admitting: Physician Assistant

## 2018-02-23 ENCOUNTER — Encounter: Payer: Self-pay | Admitting: Physician Assistant

## 2018-02-23 VITALS — BP 136/91 | HR 102

## 2018-02-23 DIAGNOSIS — I10 Essential (primary) hypertension: Secondary | ICD-10-CM

## 2018-02-23 DIAGNOSIS — F5101 Primary insomnia: Secondary | ICD-10-CM

## 2018-02-23 DIAGNOSIS — F3181 Bipolar II disorder: Secondary | ICD-10-CM

## 2018-02-23 DIAGNOSIS — R232 Flushing: Secondary | ICD-10-CM

## 2018-02-23 DIAGNOSIS — F411 Generalized anxiety disorder: Secondary | ICD-10-CM

## 2018-02-23 DIAGNOSIS — E8940 Asymptomatic postprocedural ovarian failure: Secondary | ICD-10-CM

## 2018-02-23 DIAGNOSIS — Z79899 Other long term (current) drug therapy: Secondary | ICD-10-CM

## 2018-02-23 MED ORDER — ESTRADIOL 0.1 MG/24HR TD PTWK: 0.1000 mg | MEDICATED_PATCH | TRANSDERMAL | 2 refills | Status: DC

## 2018-02-23 MED ORDER — BUSPIRONE HCL 10 MG PO TABS: 10.0000 mg | ORAL_TABLET | Freq: Two times a day (BID) | ORAL | 2 refills | Status: DC

## 2018-02-23 MED ORDER — TRAZODONE HCL 50 MG PO TABS: ORAL_TABLET | ORAL | 1 refills | Status: DC

## 2018-02-23 NOTE — Progress Notes (Signed)
Subjective:    Patient ID: Margaret Lawson, female    DOB: 1981/01/18, 37 y.o.   MRN: [id number]  HPI  Pt is a 37 yr old w/ a hx of Bipolar, fibromyalgia, HTN, GAD presenting to the clinic for low blood pressure.   Blood pressure: Pt says she has been getting dizzy after standing from a seated position. This started 7 days ago. Pt was last seen 02/07/18 and was having un-controlled HTN and was put on Maxzide. Pt was previously only taking atenolol 100 mg daily. Pt says due to these symptoms she has only been taking the Maxzide in the AM and taking half dose of the atenolol at night when she needs it. She says this regimen has helped with the dizziness. Denies new medicine changes. Pt denies syncope, pre syncopal, cp, palpitation, vision changes.   Hot flashes: During last visit on 02/07/18 pt was put on Estradiol patch for menopausal induced hot flashes following her total hysterectomy with bilateral salpingo-oophorectomy back in 2017 . She says the patches have not helped with the hot flashes. She was previously on pills with no relief. She says this is also affecting her mood. Pt is currently taking the 0.05 mg of the patch.    Mood: Pt says she has been feeling really anxious. She is unsure if this is related to her menopause or overall anxiety. She denies SI/HI. Last visit pt was out on Cymbalta and ability on top of her Elavil. She is still complaint with her medicine. She says the medicine is helping some but not much with her mood. 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 This has caused her to be fatigued at work.    .. Active Ambulatory Problems    Diagnosis Date Noted  . History of nephrolithiasis 12/27/2013  . Asthma, chronic 12/27/2013  . Essential hypertension, benign 12/27/2013  . Fibromyalgia 12/27/2013  . Muscle spasm 12/27/2013  . GERD (gastroesophageal reflux disease) 12/27/2013  . Leukocytosis 12/27/2013  .  Hyperlipidemia 01/03/2014  . Other migraine without status migrainosus, not intractable 03/07/2015  . 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 History of cholecystectomy 09/11/2010  . Ovarian cyst, right 07/15/2015  . Amenorrhea 07/15/2015  . Generalized anxiety disorder 09/10/2015  . Abnormal uterine bleeding (AUB) 09/10/2015  . Hypokalemia 09/27/2015  . 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 Surgical menopause 04/23/2016  . Cervical spondylosis without myelopathy 04/23/2016  . Nipple discharge in female 08/03/2016  . Anorectal skin tags 08/03/2016  . Loose stools 08/03/2016  . Bipolar 2 disorder, major depressive episode ([place]) 11/13/2016  . No energy 11/13/2016  . Frequent headaches 11/15/2016  . Family history of brain aneurysm 11/15/2016  . Moderate persistent asthma without complication 28/36/6294  . Functional diarrhea 12/03/2016  . Fatigue 02/13/2018  . Inattention 02/13/2018  . Hot flashes 02/26/2018   Resolved Ambulatory Problems    Diagnosis Date Noted  . Perforation of left tympanic membrane 12/27/2013  . Bipolar I disorder, most recent episode depressed ([place]) 10/11/2014  . Post-operative state 09/10/2015  . Acute tonsillitis 04/23/2016  . Acute non-recurrent maxillary sinusitis 12/03/2016   Past Medical History:  Diagnosis Date  . Anxiety   . Asthma, chronic 12/27/2013  . Bipolar 2 disorder ([place])   . Essential hypertension, benign 12/27/2013  . Fibromyalgia 12/27/2013  . GERD (gastroesophageal reflux disease)   . Headache   . Leukocytosis 12/27/2013  . Low iron   . PCOS (polycystic  ovarian syndrome)   . Perforation of left tympanic membrane 12/27/2013  . Poor dentition   . Restless leg syndrome   . Right leg pain   . Teeth clenching   . Teeth grinding   . Weakness       Review of Systems  Constitutional: Negative for chills, fatigue and fever.  HENT: Negative.   Respiratory: Negative.  Negative for cough, choking and shortness of breath.    Cardiovascular: Negative for chest pain, palpitations and leg swelling.  Endocrine:       + hot flashes  Psychiatric/Behavioral: Negative for agitation, dysphoric mood, hallucinations and suicidal ideas. The patient is nervous/anxious. The patient is not hyperactive.   All other systems reviewed and are negative.      Objective:   Physical Exam  Constitutional: She is oriented to person, place, and time. She appears well-developed and well-nourished.  HENT:  Head: Normocephalic and atraumatic.  Eyes: Pupils are equal, round, and reactive to light. Conjunctivae and EOM are normal.  Neck: Normal range of motion.  Cardiovascular: Normal rate, regular rhythm and normal heart sounds.  Pulmonary/Chest: Effort normal and breath sounds normal.  Musculoskeletal: Normal range of motion.  Neurological: She is alert and oriented to person, place, and time.  Skin: Skin is warm and dry.      Assessment & Plan:  Marland KitchenMarland KitchenDiagnoses and all orders for this visit:  Essential hypertension, benign  Hot flashes -     estradiol (CLIMARA - DOSED IN MG/24 HR) 0.1 mg/24hr patch; Place 1 patch (0.1 mg total) onto the skin once a week.  Medication management -     Basic metabolic panel  Surgical menopause -     estradiol (CLIMARA - DOSED IN MG/24 HR) 0.1 mg/24hr patch; Place 1 patch (0.1 mg total) onto the skin once a week.  Bipolar 2 disorder, major depressive episode (HCC) -     busPIRone (BUSPAR) 10 MG tablet; Take 1 tablet (10 mg total) by mouth 2 (two) times daily. -     traZODone (DESYREL) 50 MG tablet; Take one to two tablet at bedtime for sleep.  Generalized anxiety disorder -     busPIRone (BUSPAR) 10 MG tablet; Take 1 tablet (10 mg total) by mouth 2 (two) times daily.  Primary insomnia -     traZODone (DESYREL) 50 MG tablet; Take one to two tablet at bedtime for sleep.    Depression screen [facility] 2/9 02/23/2018 02/07/2018 11/13/2016  Decreased Interest 2 3 2   Down, Depressed, Hopeless 2 3 2    PHQ - 2 Score 4 6 4   Altered sleeping 3 3 1   Tired, decreased energy 3 3 3   Change in appetite 2 3 3   Feeling bad or failure about yourself  3 3 2   Trouble concentrating 2 3 3   Moving slowly or fidgety/restless 2 3 3   Suicidal thoughts 1 - 0  PHQ-9 Score 20 24 19   Difficult doing work/chores Extremely dIfficult Extremely dIfficult -   GAD 7 : Generalized Anxiety Score 02/23/2018 02/07/2018 11/13/2016  Nervous, Anxious, on Edge 3 3 3   Control/stop worrying 3 3 3   Worry too much - different things 3 3 3   Trouble relaxing 3 3 3   Restless 3 3 3   Easily annoyed or irritable 2 3 3   Afraid - awful might happen 3 2 3   Total GAD 7 Score 20 20 21   Anxiety Difficulty Extremely difficult Extremely difficult Very difficult    Vitals:   02/23/18 1109 02/23/18 1110 02/23/18 1111  BP: 139/88 137/86 (!) 136/91  Pulse: 80 82 (!) 102    -Blood pressure: Pt orthostatic and BP good here. Pt told it is normal for people to feel lightheaded after getting up from a seated position. Pt told to continue being hydrated. To follow up if this is getting worse. Currently taking atenolol and Maxzide. Previous lab slip provided to check on electrolyte causing symptoms due to previous hx of hypokalemia and due to HCTZ medicine. Will follow up on results  -Mood: Pt was put on Cymbalta and Abilify only 15 days ago. No worsening hot flashes on Cymbalta. Pt aware antidepressants often take 4-6 weeks to work. Will add trazadone for sleep. Buspar for anxiety. Note was provided to job for pt to work 4 hours a day for now until mood improves. She is working 6 hours a day currently. If mood worsens to consider seeing a psychiatrist or counselor. She is thinking she may need disability but wants to hold off on that and try medicine first. Will follow up on this..   -Hot flashes: Will increase dose from 0.05 to 0.1 mg for now. Encouraged to follow up. Pt was previously on pills with no relief. Pt is also a smoker.   Marland KitchenVernetta Honey PA-C, have reviewed and agree with the above documentation in it's entirety.

## 2018-02-23 NOTE — Patient Instructions (Signed)
Increase climara.  Added buspar twice a day for anxiety.

## 2018-02-24 ENCOUNTER — Encounter: Payer: Self-pay | Admitting: Physician Assistant

## 2018-02-25 MED ORDER — FLUCONAZOLE 150 MG PO TABS: 150.0000 mg | ORAL_TABLET | Freq: Once | ORAL | 0 refills | Status: AC

## 2018-02-26 DIAGNOSIS — R232 Flushing: Secondary | ICD-10-CM | POA: Insufficient documentation

## 2018-03-02 ENCOUNTER — Encounter: Payer: Self-pay | Admitting: Physician Assistant

## 2018-03-07 ENCOUNTER — Ambulatory Visit: Payer: Self-pay | Admitting: Physician Assistant

## 2018-03-17 ENCOUNTER — Ambulatory Visit (INDEPENDENT_AMBULATORY_CARE_PROVIDER_SITE_OTHER): Payer: Self-pay | Admitting: Family Medicine

## 2018-03-17 VITALS — BP 148/80 | HR 58 | Ht 69.0 in | Wt 248.0 lb

## 2018-03-17 DIAGNOSIS — S39012A Strain of muscle, fascia and tendon of lower back, initial encounter: Secondary | ICD-10-CM

## 2018-03-17 DIAGNOSIS — Z23 Encounter for immunization: Secondary | ICD-10-CM

## 2018-03-17 DIAGNOSIS — M25511 Pain in right shoulder: Secondary | ICD-10-CM

## 2018-03-17 DIAGNOSIS — F3181 Bipolar II disorder: Secondary | ICD-10-CM

## 2018-03-17 DIAGNOSIS — G43009 Migraine without aura, not intractable, without status migrainosus: Secondary | ICD-10-CM

## 2018-03-17 MED ORDER — AMITRIPTYLINE HCL 50 MG PO TABS: 50.0000 mg | ORAL_TABLET | Freq: Every day | ORAL | 1 refills | Status: DC

## 2018-03-17 NOTE — Progress Notes (Signed)
Margaret Lawson is a 37 y.o. female who presents to [facility] today for back pain and shoulder pain.  Margaret Lawson has a 2-week history of back pain.  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  She notes her back is been bothering her for 2 weeks without injury.  She notes pain in the left lower back worse with activity better with rest.  She denies any pain radiating below the level of the knee.  No weakness or numbness bowel or bladder dysfunction.  She notes ibuprofen has been helpful.  Additionally she is tried heat and ice which have helped a little as well.  No fevers chills nausea vomiting or diarrhea.  Additionally patient notes a 7-month history of right shoulder pain.  She notes pain in the anterior shoulder with shoulder motion.  She notes pain is worse after long day of work.  She feels some instability but no locking or catching or significant instability subluxation or dislocation.  She notes 10 years ago she fell and injured her shoulder but never really had much of work-up that she can remember.   Additionally patient notes that last week she was having a crisis with her mood.  She was being threatened with eviction and had lots of drop in life stress.  She was having lots of panic attacks.  Her PCP prescribed Abilify and Cymbalta.  She has not been taking these medications and is feeling a lot better since her life stressor has reduced.  She is done lots of therapy and has good insight into her feelings and emotions.  She has a good support at home.  ROS:  As above  Exam:  BP (!) 148/80   Pulse (!) 58   Ht 5\' 9"  (1.753 m)   Wt 248 lb (112.5 kg)   LMP 07/28/2015   BMI 36.62 kg/m  General: Well Developed, well nourished, and in no acute distress.  Neuro:  extra-ocular muscles intact, able to move all 4 extremities, sensation grossly intact. Skin: Warm  and dry, no rashes noted.  Respiratory: Not using accessory muscles, speaking in full sentences, trachea midline.  Cardiovascular: Pulses palpable, no extremity edema. Abdomen: Does not appear distended. Psych: Alert and oriented normal speech thought process and affect.  No SI or HI expressed. MSK:  C-spine: Nontender to midline normal-appearing normal motion negative Spurling's test.  Right shoulder normal-appearing nontender. Normal motion.  Normal strength.  Negative impingement testing.  Slight clunk test positivity.  Pulses cap refill and sensation are intact bilateral upper extremities.  L-spine: Nontender to midline.  Tender palpation left lumbar paraspinal muscle group.  Tender palpation left SI joint. Lumbar motion intact flexion but limited in extension and left rotation left lateral flexion.  Pain reproduced with this exam test. Lower extremity strength is intact bilaterally. Sensation reflexes and pulses are intact bilaterally.  Normal gait.      Assessment and Plan: 37 y.o. female with  Back pain: Lumbosacral strain.  Plan for continued prescription strength NSAIDs as needed.  Continue heating pad and TENS unit as tolerated.  Advance activity as tolerated.  Home exercise program reviewed with patient.  Plan for physical therapy as able at [facility].  Recheck as needed.  Right shoulder pain: Possible labrum tear however symptoms are quite mild at this time.  I think she will do very well with  home rotator cuff strengthening program.  Additionally physical therapy would be helpful if able.  Psych: Stabilizing.  Discussed medication options and back-up plan.  Abilify and Cymbalta removed from medication list but may resume in the near future per patient preference and after discussion with PCP.  Amitriptyline refilled.  Recheck with me as needed.  Influenza vaccine given today prior to discharge.  Orders Placed This Encounter  Procedures  .  Flu Vaccine QUAD 36+ mos IM    [facility]  . Ambulatory referral to Physical Therapy    Referral Priority:   Routine    Referral Type:   Physical Medicine    Referral Reason:   Specialty Services Required    Requested Specialty:   Physical Therapy    Number of Visits Requested:   1   Meds ordered this encounter  Medications  . amitriptyline (ELAVIL) 50 MG tablet    Sig: Take 1 tablet (50 mg total) by mouth at bedtime.    Dispense:  90 tablet    Refill:  1    Historical information moved to improve visibility of documentation.  Past Medical History:  Diagnosis Date  . Anxiety    severe  . Asthma, chronic 12/27/2013  . Bipolar 2 disorder ([place])   . Essential hypertension, benign 12/27/2013  . Fibromyalgia 12/27/2013  . GERD (gastroesophageal reflux disease)   . Headache    migraines, over stimulation  . Leukocytosis 12/27/2013  . Low iron   . PCOS (polycystic ovarian syndrome)   . Perforation of left tympanic membrane 12/27/2013  . Poor dentition    right side upper and lower  . Restless leg syndrome   . Right leg pain   . Teeth clenching   . Teeth grinding   . Weakness    when exposed to hot or cold temperatures   Past Surgical History:  Procedure Laterality Date  . CHOLECYSTECTOMY    . ROBOTIC ASSISTED TOTAL HYSTERECTOMY WITH BILATERAL SALPINGO OOPHERECTOMY Bilateral 09/10/2015   Procedure: ROBOTIC ASSISTED TOTAL HYSTERECTOMY WITH BILATERAL SALPINGECTOMY;  Surgeon: Lavonia Drafts, MD;  Location: [facility];  Service: Gynecology;  Laterality: Bilateral;  . WISDOM TOOTH EXTRACTION     Social History   Tobacco Use  . Smoking status: Current Every Day Smoker    Packs/day: 1.00    Years: 20.00    Pack years: 20.00    Types: Cigarettes  . Smokeless tobacco: Never Used  . Tobacco comment: uses 6mg  vapes  Substance Use Topics  . Alcohol use: No    Alcohol/week: 0.0 standard drinks   family history includes Aneurysm in her mother; Breast cancer in her paternal aunt and  paternal grandfather; Depression in her father and sister; Diabetes in her father; Heart disease in her father; Hypertension in her father; Mitral valve prolapse in her father.  Medications: Current Outpatient Medications  Medication Sig Dispense Refill  . acetaminophen (TYLENOL) 500 MG tablet Take 500 mg by mouth every 6 (six) hours as needed for mild pain or moderate pain.    Marland Kitchen albuterol (PROVENTIL HFA;VENTOLIN HFA) 108 (90 Base) MCG/ACT inhaler Inhale 2 puffs into the lungs every 4 (four) hours as needed for wheezing. 18 g 4  . albuterol (PROVENTIL) (2.5 MG/3ML) 0.083% nebulizer solution Take 3 mLs (2.5 mg total) by nebulization every 4 (four) hours as needed for wheezing. 75 mL 0  . amitriptyline (ELAVIL) 50 MG tablet Take 1 tablet (50 mg total) by mouth at bedtime. 90 tablet 1  . atenolol (TENORMIN) 100 MG tablet  Take 1 tablet (100 mg total) by mouth daily. 30 tablet 5  . busPIRone (BUSPAR) 10 MG tablet Take 1 tablet (10 mg total) by mouth 2 (two) times daily. 60 tablet 2  . estradiol (CLIMARA - DOSED IN MG/24 HR) 0.1 mg/24hr patch Place 1 patch (0.1 mg total) onto the skin once a week. 4 patch 2  . Homeopathic Products (FIBROMYALGIA SYMPTOM RELIEF SL) Place under the tongue.    Marland Kitchen ipratropium-albuterol (DUONEB) 0.5-2.5 (3) MG/3ML SOLN Take 3 mLs by nebulization every 4 (four) hours as needed (wheezing/cough/sob). 360 mL 1  . MELATONIN PO Take by mouth.    . Multiple Vitamin (MULTIVITAMIN) capsule Take 1 capsule by mouth daily.    . potassium chloride (MICRO-K) 10 MEQ CR capsule Take 1 capsule (10 mEq total) by mouth 2 (two) times daily. 60 capsule 5  . traZODone (DESYREL) 50 MG tablet Take one to two tablet at bedtime for sleep. 60 tablet 1  . triamterene-hydrochlorothiazide (MAXZIDE-25) 37.5-25 MG tablet Take 1 tablet by mouth daily. 30 tablet 5  . valACYclovir (VALTREX) 500 MG tablet Take 1 tablet (500 mg total) by mouth daily. 30 tablet 5  . CALCIUM-VITAMIN D PO Take by mouth.     No  current facility-administered medications for this visit.    Allergies  Allergen Reactions  . Adhesive [Tape] Other (See Comments)    Blisters, paper tape is worse  . Ambien [Zolpidem Tartrate]     Insomnia.  . Celecoxib Nausea And Vomiting  . Doxycycline     Double vision   . Erythromycin Nausea And Vomiting  . Lyrica [Pregabalin]     Induced lactation  . Morphine And Related     Decrease bp  . Penicillins Hives    Has patient had a PCN reaction causing immediate rash, facial/tongue/throat swelling, SOB or lightheadedness with hypotension: Yes Has patient had a PCN reaction causing severe rash involving mucus membranes or skin necrosis: No Has patient had a PCN reaction that required hospitalization No Has patient had a PCN reaction occurring within the last 10 years: Yes If all of the above answers are "NO", then may proceed with Cephalosporin use.   . Latex Rash      Discussed warning signs or symptoms. Please see discharge instructions. Patient expresses understanding.

## 2018-03-17 NOTE — Patient Instructions (Addendum)
Thank you for coming in today. Continue ibuprofen up to 800mg  three times daily as needed.  Use amitriptyline at bedtime as needed.   Continue TENS unit and heating pad.   Look up for Back Yoga or Core stabilizing exercises.   Work on shoulder stabilizing exercises.   For PT consider [facility].

## 2018-03-29 ENCOUNTER — Ambulatory Visit (INDEPENDENT_AMBULATORY_CARE_PROVIDER_SITE_OTHER): Payer: Self-pay | Admitting: Physician Assistant

## 2018-03-29 VITALS — BP 132/84 | HR 88

## 2018-03-29 DIAGNOSIS — F99 Mental disorder, not otherwise specified: Secondary | ICD-10-CM

## 2018-03-29 DIAGNOSIS — F5105 Insomnia due to other mental disorder: Secondary | ICD-10-CM

## 2018-03-29 DIAGNOSIS — R232 Flushing: Secondary | ICD-10-CM

## 2018-03-29 DIAGNOSIS — F3181 Bipolar II disorder: Secondary | ICD-10-CM

## 2018-03-29 DIAGNOSIS — F410 Panic disorder [episodic paroxysmal anxiety] without agoraphobia: Secondary | ICD-10-CM

## 2018-03-29 DIAGNOSIS — F411 Generalized anxiety disorder: Secondary | ICD-10-CM

## 2018-03-29 DIAGNOSIS — E8940 Asymptomatic postprocedural ovarian failure: Secondary | ICD-10-CM

## 2018-03-29 MED ORDER — TRAZODONE HCL 50 MG PO TABS
ORAL_TABLET | ORAL | 5 refills | Status: DC
Start: 2018-03-29 — End: 2019-01-11

## 2018-03-29 MED ORDER — ARIPIPRAZOLE 2 MG PO TABS: 2.0000 mg | ORAL_TABLET | Freq: Every day | ORAL | 1 refills | Status: DC

## 2018-03-29 MED ORDER — CLONAZEPAM 0.5 MG PO TABS: 0.5000 mg | ORAL_TABLET | Freq: Two times a day (BID) | ORAL | 1 refills | Status: DC | PRN

## 2018-03-29 MED ORDER — ESTRADIOL 0.5 MG PO TABS: 0.5000 mg | ORAL_TABLET | Freq: Every day | ORAL | 1 refills | Status: DC

## 2018-03-29 NOTE — Progress Notes (Signed)
Subjective:    Patient ID: Margaret Lawson, female    DOB: 1981-06-18, 37 y.o.   MRN: [id number]  HPI  Patient is a 37 year old female with bipolar 2 disorder with anxiety who presents to the clinic for follow-up.  She is doing well today.  At last visit with me we started her on Cymbalta and Abilify.  She was on this for about 2 weeks and decided to take herself off.  When I asked her specifically why she felt like her anxiety could be getting worse.  She did not have any specific side effect.  Over the years she has been tried on multiple medications.  Overall the SSRI class seems to make her symptoms worse.  She does continue on BuSpar.  She is having many more panic attacks.  She is having multiple panic attacks at work when she is asked to do tasks as well as even in public.  There is no specific trigger.  She just feels very on edge about life.  She cannot remember why she stopped Lamictal or Wellbutrin.  She denies any homicidal thoughts.  She does feel like she does not want to live life this way.  She denies any suicide plan but thinks about life being over a lot.  .. Active Ambulatory Problems    Diagnosis Date Noted  . History of nephrolithiasis 12/27/2013  . Asthma, chronic 12/27/2013  . Essential hypertension, benign 12/27/2013  . Fibromyalgia 12/27/2013  . Muscle spasm 12/27/2013  . GERD (gastroesophageal reflux disease) 12/27/2013  . Leukocytosis 12/27/2013  . Hyperlipidemia 01/03/2014  . Other migraine without status migrainosus, not intractable 03/07/2015  . 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 History of cholecystectomy 09/11/2010  . Ovarian cyst, right 07/15/2015  . Amenorrhea 07/15/2015  . Generalized anxiety disorder 09/10/2015  . Abnormal uterine bleeding (AUB) 09/10/2015  . Hypokalemia 09/27/2015  . 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 Surgical menopause 04/23/2016  . Cervical spondylosis without myelopathy 04/23/2016  . Nipple  discharge in female 08/03/2016  . Anorectal skin tags 08/03/2016  . Loose stools 08/03/2016  . Bipolar 2 disorder, major depressive episode ([place]) 11/13/2016  . No energy 11/13/2016  . Frequent headaches 11/15/2016  . Family history of brain aneurysm 11/15/2016  . Moderate persistent asthma without complication 46/27/0350  . Functional diarrhea 12/03/2016  . Fatigue 02/13/2018  . Inattention 02/13/2018  . Hot flashes 02/26/2018  . Panic attacks 03/30/2018  . Insomnia due to other mental disorder 03/30/2018   Resolved Ambulatory Problems    Diagnosis Date Noted  . Perforation of left tympanic membrane 12/27/2013  . Bipolar I disorder, most recent episode depressed ([place]) 10/11/2014  . Post-operative state 09/10/2015  . Acute tonsillitis 04/23/2016  . Acute non-recurrent maxillary sinusitis 12/03/2016   Past Medical History:  Diagnosis Date  . Anxiety   . Bipolar 2 disorder ([place])   . Headache   . Low iron   . PCOS (polycystic ovarian syndrome)   . Poor dentition   . Restless leg syndrome   . Right leg pain   . Teeth clenching   . Teeth grinding   . Weakness       Review of Systems See HPI.     Objective:   Physical Exam  Constitutional: She is oriented to person, place, and time. She appears well-developed.  HENT:  Head: Normocephalic and atraumatic.  Cardiovascular: Normal rate and regular rhythm.  Neurological: She is alert and oriented to person, place, and time.  Psychiatric:  Tearful, frustrated          Assessment & Plan:  Marland KitchenMarland KitchenDiagnoses and all orders for this visit:  Panic attacks -     clonazePAM (KLONOPIN) 0.5 MG tablet; Take 1 tablet (0.5 mg total) by mouth 2 (two) times daily as needed for anxiety. -     ARIPiprazole (ABILIFY) 2 MG tablet; Take 1 tablet (2 mg total) by mouth daily. -     Ambulatory referral to Psychiatry  Bipolar 2 disorder, major depressive episode (HCC) -     ARIPiprazole (ABILIFY) 2 MG tablet; Take 1 tablet (2 mg total) by  mouth daily. -     traZODone (DESYREL) 50 MG tablet; Take one to two tablet at bedtime for sleep. -     Ambulatory referral to Psychiatry  Insomnia due to other mental disorder -     traZODone (DESYREL) 50 MG tablet; Take one to two tablet at bedtime for sleep. -     Ambulatory referral to Psychiatry  Hot flashes -     estradiol (ESTRACE) 0.5 MG tablet; Take 1 tablet (0.5 mg total) by mouth daily.  Surgical menopause -     estradiol (ESTRACE) 0.5 MG tablet; Take 1 tablet (0.5 mg total) by mouth daily.  Generalized anxiety disorder -     ARIPiprazole (ABILIFY) 2 MG tablet; Take 1 tablet (2 mg total) by mouth daily. -     Ambulatory referral to Psychiatry   .Marland Kitchen Depression screen [facility] 2/9 02/23/2018 02/07/2018 11/13/2016  Decreased Interest 2 3 2   Down, Depressed, Hopeless 2 3 2   PHQ - 2 Score 4 6 4   Altered sleeping 3 3 1   Tired, decreased energy 3 3 3   Change in appetite 2 3 3   Feeling bad or failure about yourself  3 3 2   Trouble concentrating 2 3 3   Moving slowly or fidgety/restless 2 3 3   Suicidal thoughts 1 - 0  PHQ-9 Score 20 24 19   Difficult doing work/chores Extremely dIfficult Extremely dIfficult -   .. GAD 7 : Generalized Anxiety Score 02/23/2018 02/07/2018 11/13/2016  Nervous, Anxious, on Edge 3 3 3   Control/stop worrying 3 3 3   Worry too much - different things 3 3 3   Trouble relaxing 3 3 3   Restless 3 3 3   Easily annoyed or irritable 2 3 3   Afraid - awful might happen 3 2 3   Total GAD 7 Score 20 20 21   Anxiety Difficulty Extremely difficult Extremely difficult Very difficult    Did not repeat phq9 and GAD7 pt reports about the same if not worse. She stopped all medications. Certainly she needs to be on some medication regimen for control. She has tried a lot in the past with no benefit. I feel it is time to go to [facility] to look at different medication options. For now added abilify/buspar/klonapin/trazodone. klonapin only as needed. Likely insomnia is more related to  bipolar. Discussed dependence and abuse risk for klonapin. Hopefully will use it as a bridge until we can get more daily relief. Strongly encouraged patient to be complaint with medications. They take time to work. I do think she would benefit from counseling as well. Given resource for [facility] for affordable services.   Pt is self pay and have to be mindful of that when prescribing medications.   Switched patch to pill at patients request. Follow up in 3 months.    Marland Kitchen.Spent 30 minutes with patient and greater than 50 percent of visit spent counseling patient  regarding treatment plan.

## 2018-03-29 NOTE — Patient Instructions (Signed)
Abilify 2mg , buspar, and klonapin as needed.  Will make referral to mood treatment.  [facility] for counseling.

## 2018-03-30 ENCOUNTER — Encounter: Payer: Self-pay | Admitting: Physician Assistant

## 2018-03-30 DIAGNOSIS — F410 Panic disorder [episodic paroxysmal anxiety] without agoraphobia: Secondary | ICD-10-CM | POA: Insufficient documentation

## 2018-03-30 DIAGNOSIS — G4700 Insomnia, unspecified: Secondary | ICD-10-CM | POA: Insufficient documentation

## 2018-03-30 DIAGNOSIS — F5105 Insomnia due to other mental disorder: Secondary | ICD-10-CM | POA: Insufficient documentation

## 2018-03-30 DIAGNOSIS — F99 Mental disorder, not otherwise specified: Secondary | ICD-10-CM

## 2018-05-16 IMAGING — DX DG FOOT COMPLETE 3+V*R*
3 series · 3 of 3 positions shown · non-contrast
Comparison: None.

CLINICAL DATA: Stepped on a dog toy this morning and rolled ankle.
Mid foot and ankle pain.Initial encounter.

EXAM:
RIGHT FOOT COMPLETE - 3+ VIEW

[foot ap]
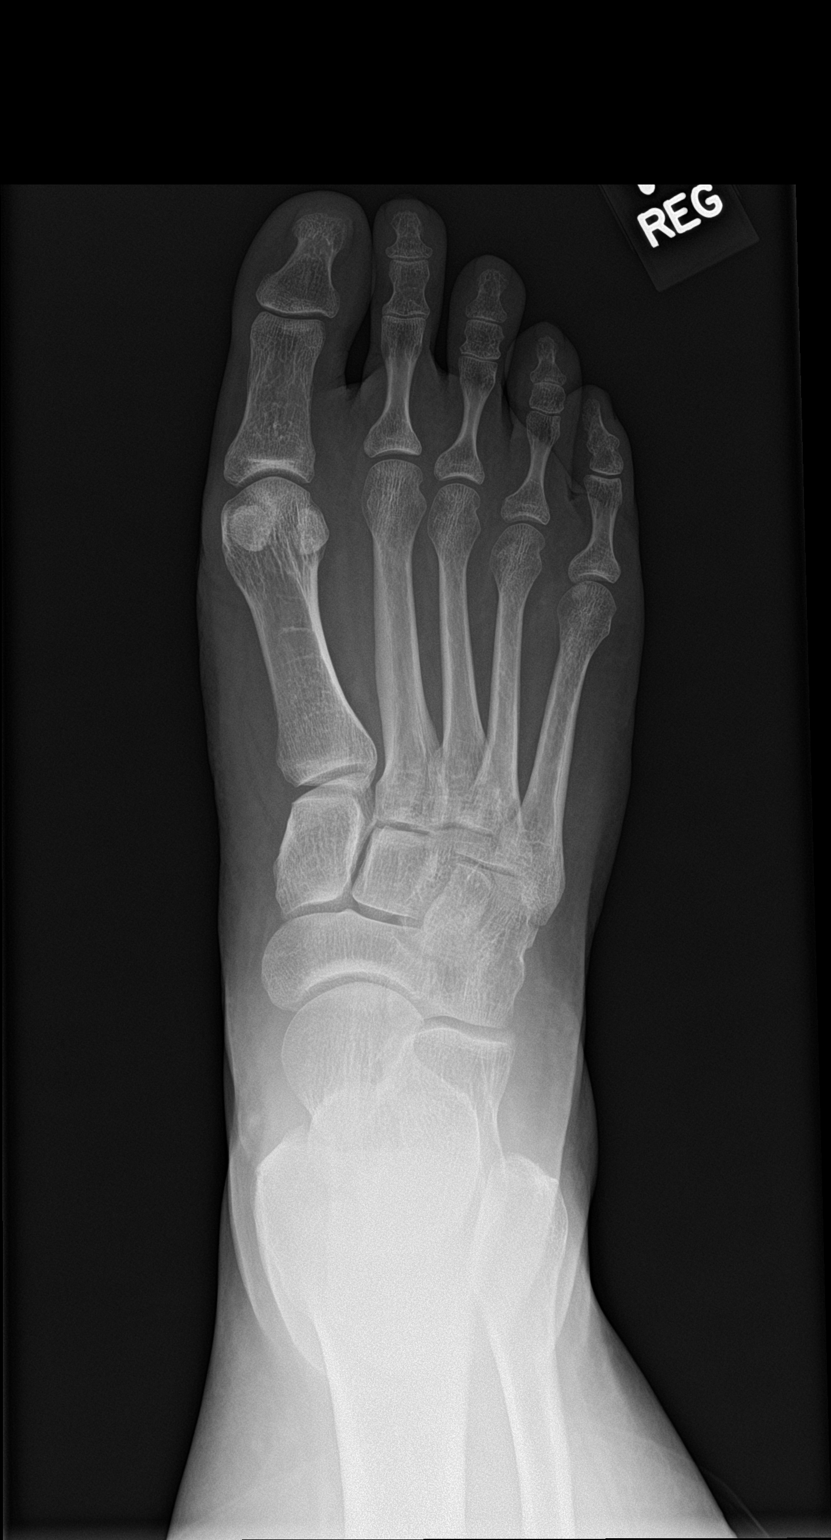

[foot obl]
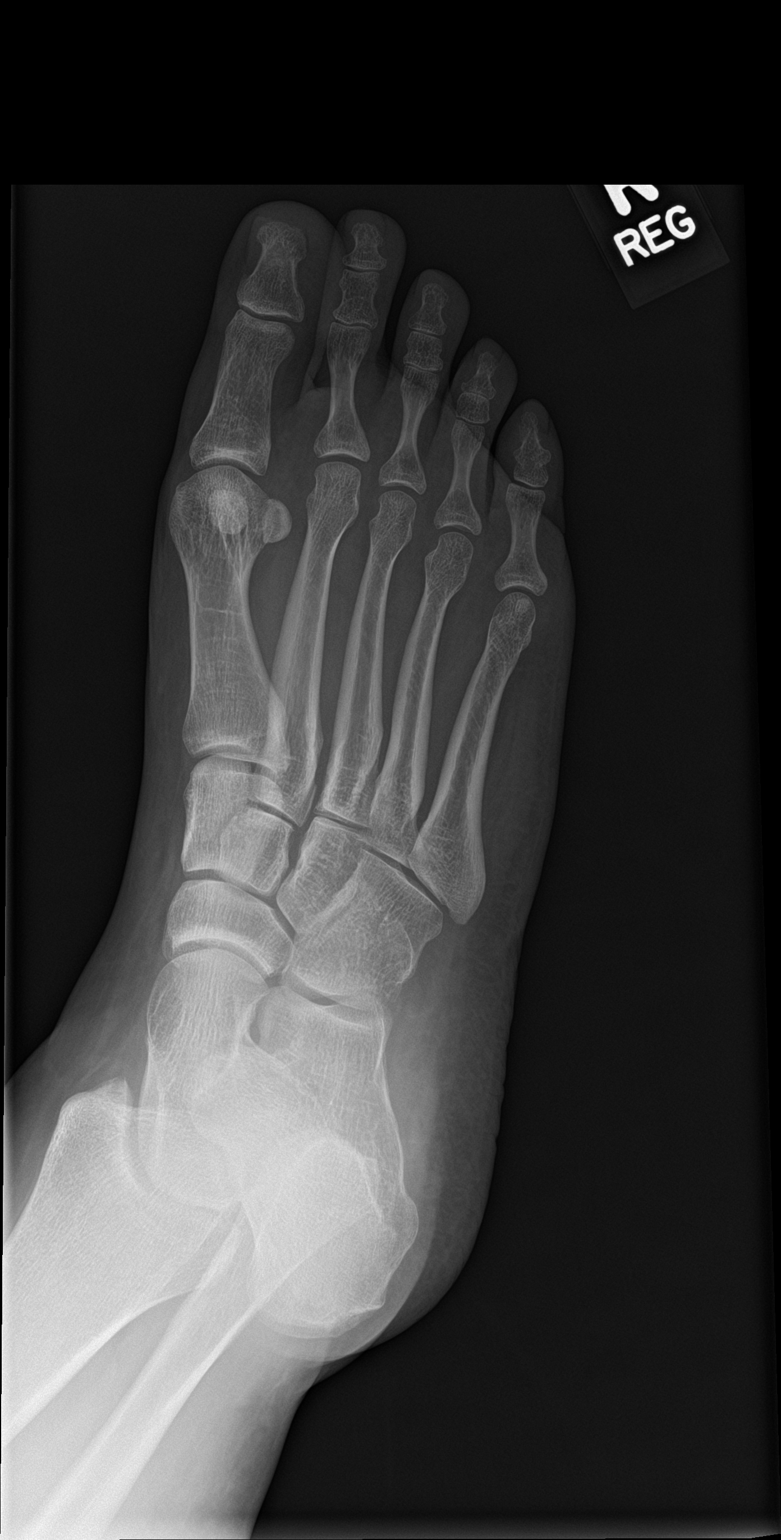

[foot lat]
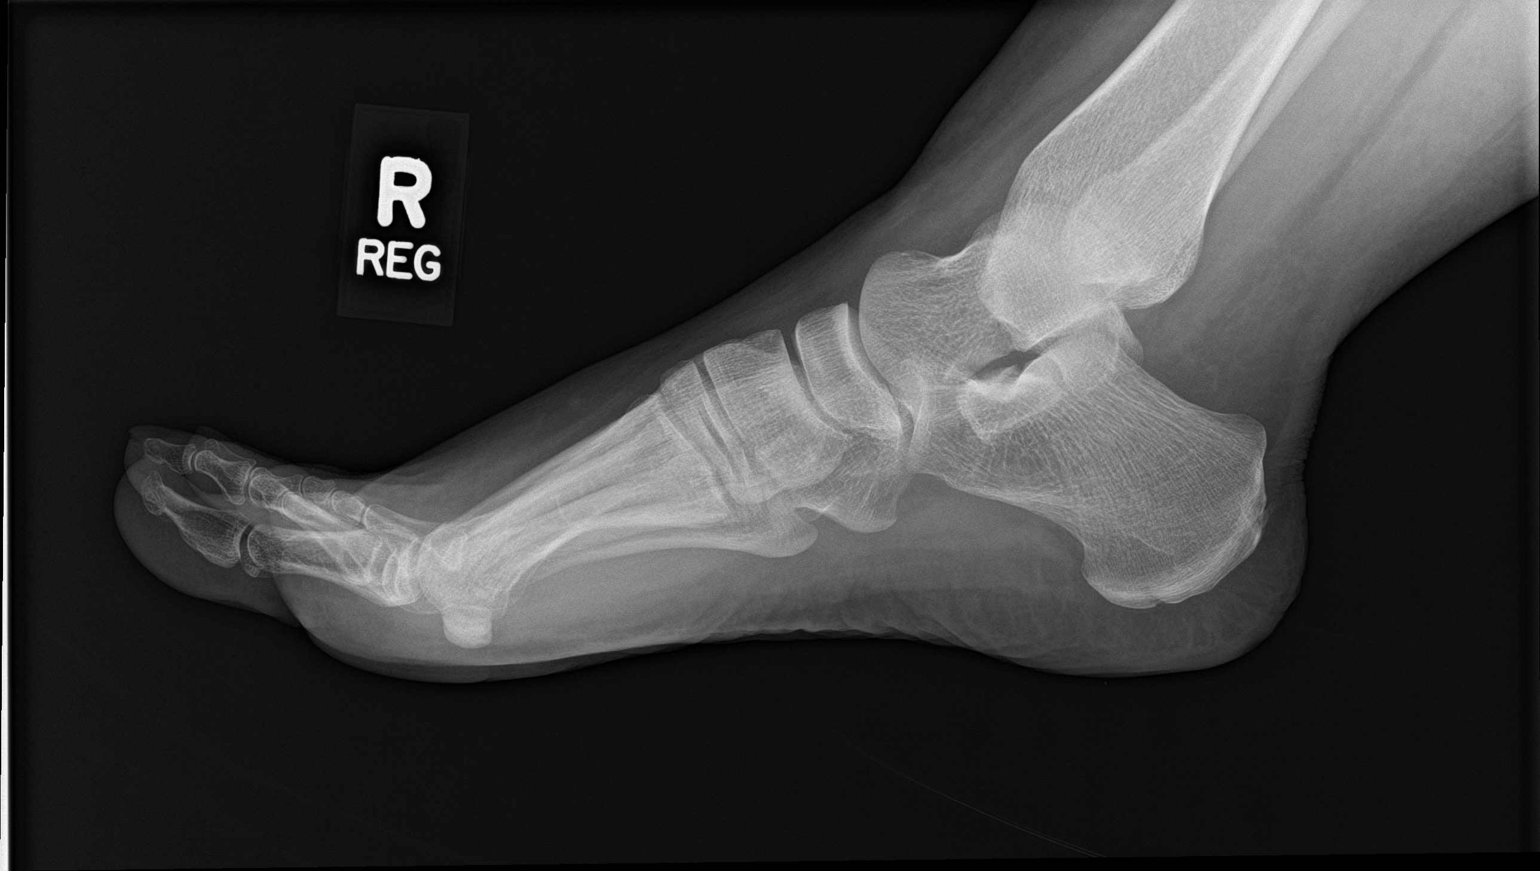

[3 of 3 positions shown; findings below may reference images not displayed]

FINDINGS: There is no evidence of fracture or dislocation. There is no
evidence of arthropathy or other focal bone abnormality. Soft
tissues are unremarkable.
IMPRESSION: Negative.

## 2018-05-16 IMAGING — DX DG ANKLE COMPLETE 3+V*R*
3 series · 3 of 3 positions shown · non-contrast
Comparison: None.

CLINICAL DATA: Stepped on a dog toy this morning and rolled ankle.
Mid foot and ankle pain.Initial encounter.

EXAM:
RIGHT ANKLE - COMPLETE 3+ VIEW

[ankle ap]
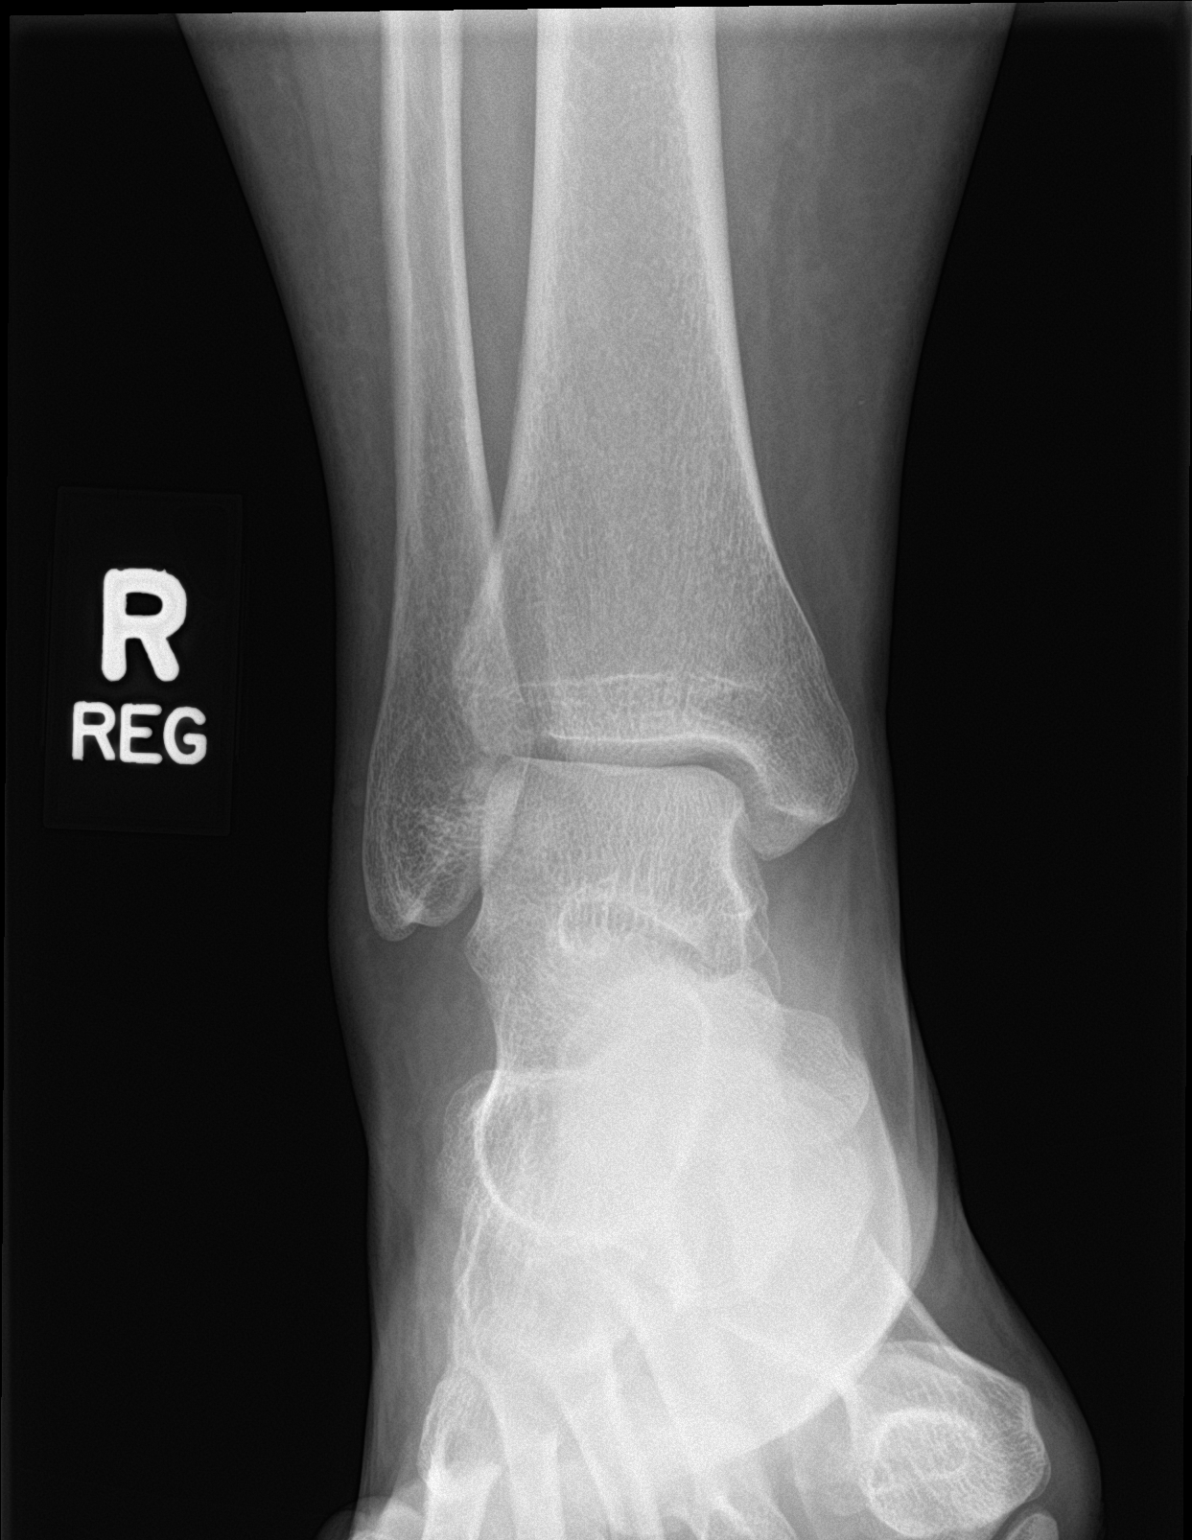

[ankle obl]
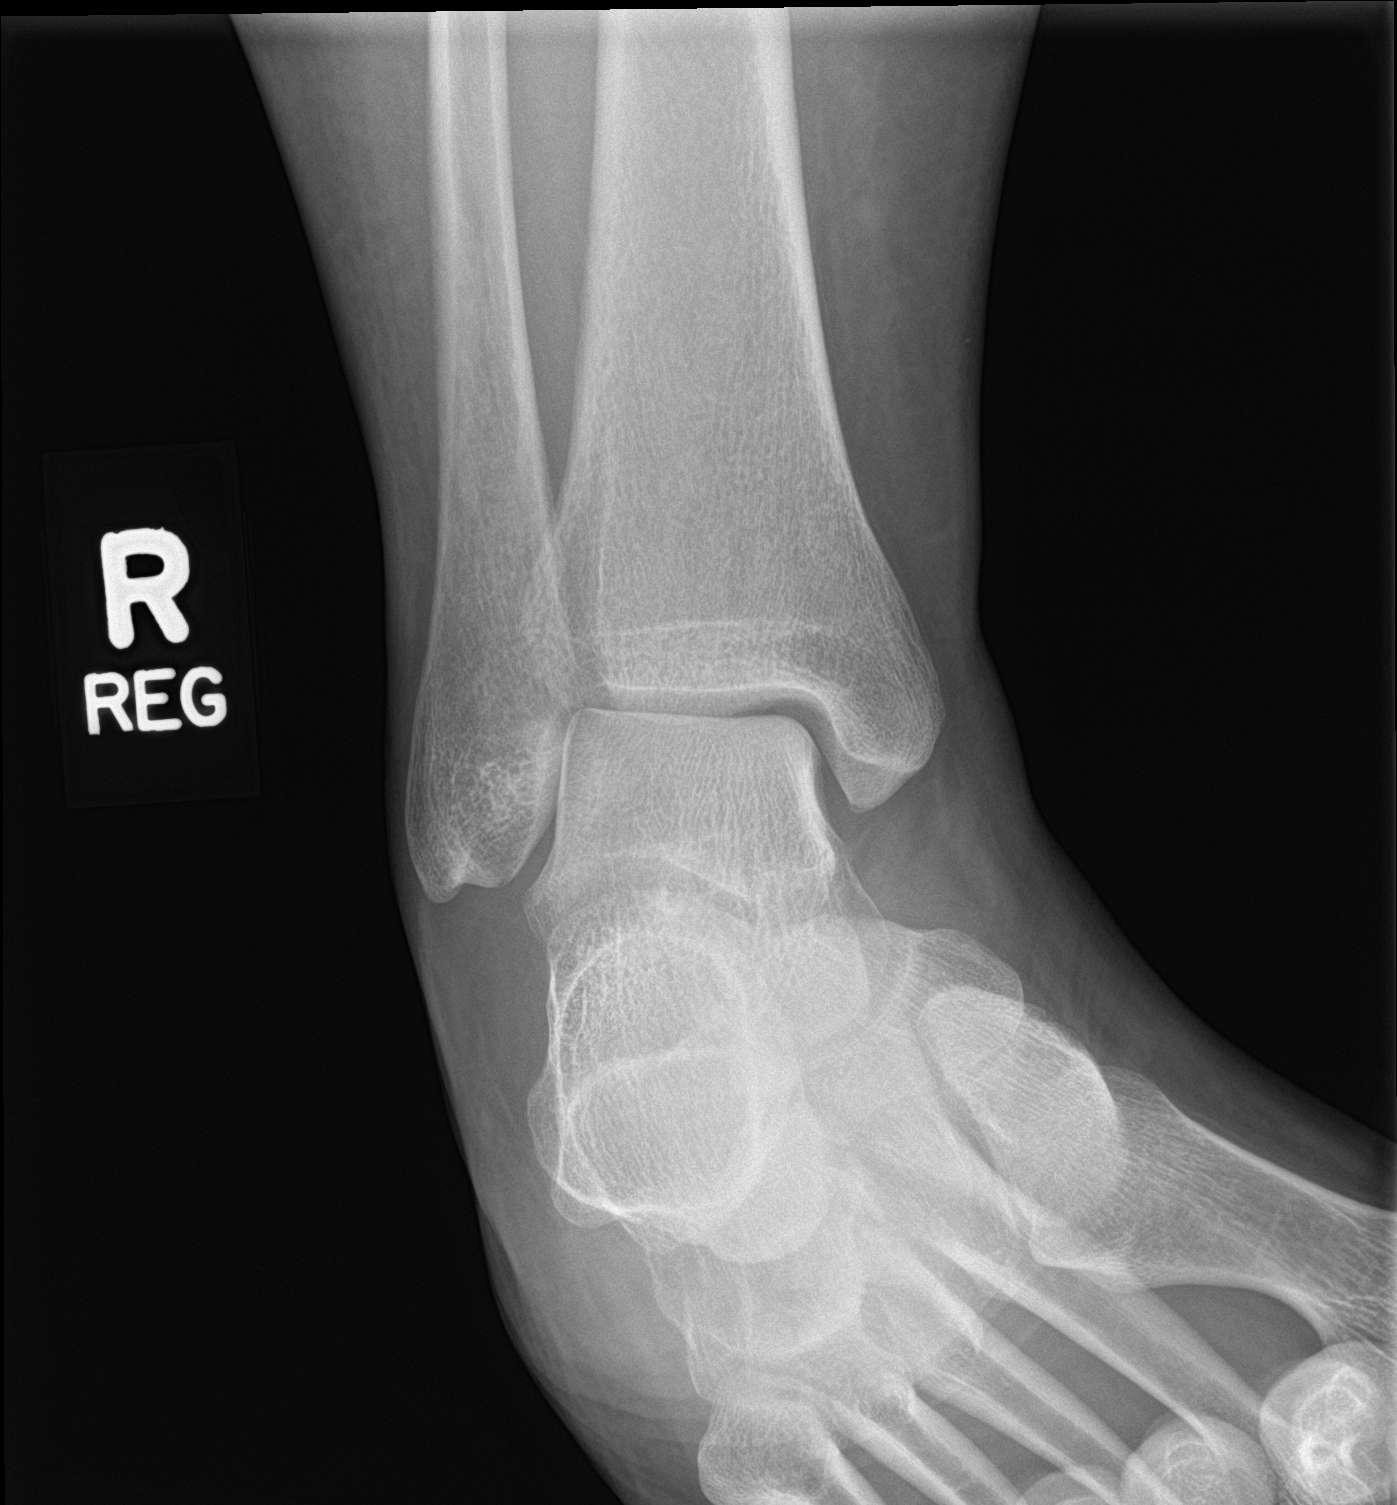

[ankle lat]
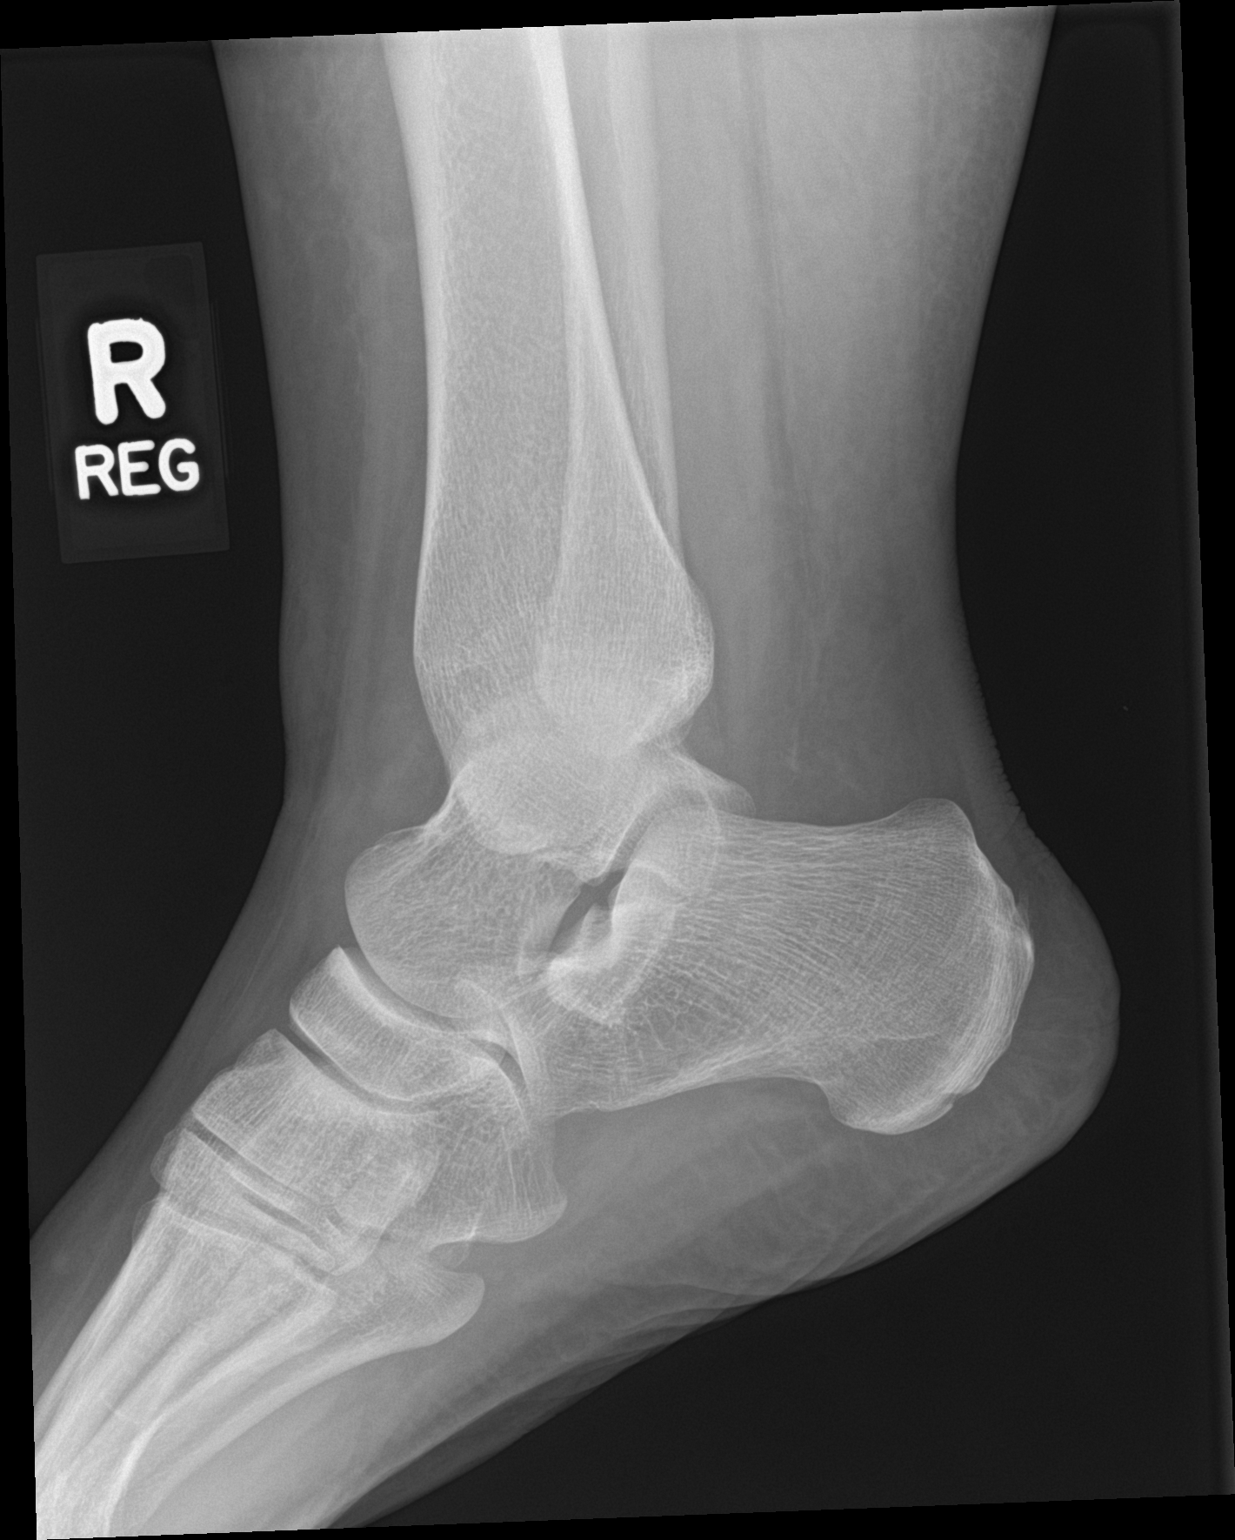

[3 of 3 positions shown; findings below may reference images not displayed]

FINDINGS: There is no evidence of fracture, dislocation, or joint effusion.
There is no evidence of arthropathy or other focal bone abnormality.
Soft tissues are unremarkable.
IMPRESSION: Negative.

## 2018-06-07 ENCOUNTER — Ambulatory Visit (INDEPENDENT_AMBULATORY_CARE_PROVIDER_SITE_OTHER): Payer: Self-pay | Admitting: Physician Assistant

## 2018-06-07 ENCOUNTER — Encounter: Payer: Self-pay | Admitting: Physician Assistant

## 2018-06-07 VITALS — BP 156/87 | HR 80 | Ht 69.0 in | Wt 248.0 lb

## 2018-06-07 DIAGNOSIS — F411 Generalized anxiety disorder: Secondary | ICD-10-CM

## 2018-06-07 DIAGNOSIS — F410 Panic disorder [episodic paroxysmal anxiety] without agoraphobia: Secondary | ICD-10-CM

## 2018-06-07 DIAGNOSIS — I10 Essential (primary) hypertension: Secondary | ICD-10-CM

## 2018-06-07 DIAGNOSIS — M797 Fibromyalgia: Secondary | ICD-10-CM

## 2018-06-07 DIAGNOSIS — F3181 Bipolar II disorder: Secondary | ICD-10-CM

## 2018-06-07 MED ORDER — DULOXETINE HCL 30 MG PO CPEP: 30.0000 mg | ORAL_CAPSULE | Freq: Every day | ORAL | 1 refills | Status: DC

## 2018-06-07 MED ORDER — BUSPIRONE HCL 10 MG PO TABS: 10.0000 mg | ORAL_TABLET | Freq: Two times a day (BID) | ORAL | 2 refills | Status: DC

## 2018-06-07 MED ORDER — CARISOPRODOL 250 MG PO TABS: 350.0000 mg | ORAL_TABLET | Freq: Every day | ORAL | 1 refills | Status: DC

## 2018-06-07 MED ORDER — KETOROLAC TROMETHAMINE 60 MG/2ML IM SOLN
60.0000 mg | Freq: Once | INTRAMUSCULAR | Status: AC
  Administered 2018-06-07: 60 mg via INTRAMUSCULAR

## 2018-06-07 MED ORDER — CLONAZEPAM 0.5 MG PO TABS: 0.5000 mg | ORAL_TABLET | Freq: Two times a day (BID) | ORAL | 1 refills | Status: DC | PRN

## 2018-06-07 NOTE — Progress Notes (Signed)
Subjective:    Patient ID: Margaret Lawson, female    DOB: [birth year], 37 y.o.   MRN: [id number]  HPI  Pt is a 37 yo female with Bipolar 2, anxiety, migraines, HTN, asthma, fibromyalgia who presents to the clinic to discuss medications again. She quit job last Tuesday due to stress. She felt like she just "could not handle it anymore". She was left at her job to manage while her bosses were out of town and it was just too much. She was having frequent panic attacks that would come out of the blue. Denies any SI/HC. She admits she is not being complaint with medications. Her fibromyalgia is flaring. She is having some hip pain as well for the last month. No injury. She feels "tight" all over. She does admit she would like to work in some capacity. She feels like her mood has worsened since staying at home.    .. Active Ambulatory Problems    Diagnosis Date Noted  . History of nephrolithiasis 12/27/2013  . Asthma, chronic 12/27/2013  . Essential hypertension, benign 12/27/2013  . Fibromyalgia 12/27/2013  . Muscle spasm 12/27/2013  . GERD (gastroesophageal reflux disease) 12/27/2013  . Leukocytosis 12/27/2013  . Hyperlipidemia 01/03/2014  . Other migraine without status migrainosus, not intractable 03/07/2015  . 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 History of cholecystectomy 09/11/2010  . Ovarian cyst, right 07/15/2015  . Amenorrhea 07/15/2015  . Generalized anxiety disorder 09/10/2015  . Abnormal uterine bleeding (AUB) 09/10/2015  . Hypokalemia 09/27/2015  . 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 Surgical menopause 04/23/2016  . Cervical spondylosis without myelopathy 04/23/2016  . Nipple discharge in female 08/03/2016  . Anorectal skin tags 08/03/2016  . Loose stools 08/03/2016  . Bipolar 2 disorder, major depressive episode ([place]) 11/13/2016  . No energy 11/13/2016  . Frequent headaches 11/15/2016  . Family history of brain aneurysm 11/15/2016  .  Moderate persistent asthma without complication 07/37/1062  . Functional diarrhea 12/03/2016  . Fatigue 02/13/2018  . Inattention 02/13/2018  . Hot flashes 02/26/2018  . Panic attacks 03/30/2018  . Insomnia due to other mental disorder 03/30/2018   Resolved Ambulatory Problems    Diagnosis Date Noted  . Perforation of left tympanic membrane 12/27/2013  . Bipolar I disorder, most recent episode depressed ([place]) 10/11/2014  . Post-operative state 09/10/2015  . Acute tonsillitis 04/23/2016  . Acute non-recurrent maxillary sinusitis 12/03/2016   Past Medical History:  Diagnosis Date  . Anxiety   . Bipolar 2 disorder ([place])   . Headache   . Low iron   . PCOS (polycystic ovarian syndrome)   . Poor dentition   . Restless leg syndrome   . Right leg pain   . Teeth clenching   . Teeth grinding   . Weakness       Review of Systems See HPI.     Objective:   Physical Exam  Constitutional: She is oriented to person, place, and time. She appears well-developed and well-nourished.  Obese.   HENT:  Head: Normocephalic and atraumatic.  Cardiovascular: Normal rate and regular rhythm.  Pulmonary/Chest: Effort normal and breath sounds normal.  Neurological: She is alert and oriented to person, place, and time.  Skin: No rash noted.  Psychiatric: Her behavior is normal.  Flat affect.           Assessment & Plan:  Marland KitchenMarland KitchenDiagnoses and all orders for this visit:  Fibromyalgia -     carisoprodol (SOMA) 250 MG tablet; Take  1.5 tablets (375 mg total) by mouth at bedtime. -     DULoxetine (CYMBALTA) 30 MG capsule; Take 1 capsule (30 mg total) by mouth daily. -     ketorolac (TORADOL) injection 60 mg  Bipolar 2 disorder, major depressive episode (HCC) -     busPIRone (BUSPAR) 10 MG tablet; Take 1 tablet (10 mg total) by mouth 2 (two) times daily. -     DULoxetine (CYMBALTA) 30 MG capsule; Take 1 capsule (30 mg total) by mouth daily.  Generalized anxiety disorder -     busPIRone  (BUSPAR) 10 MG tablet; Take 1 tablet (10 mg total) by mouth 2 (two) times daily. -     DULoxetine (CYMBALTA) 30 MG capsule; Take 1 capsule (30 mg total) by mouth daily.  Panic attacks -     busPIRone (BUSPAR) 10 MG tablet; Take 1 tablet (10 mg total) by mouth 2 (two) times daily. -     clonazePAM (KLONOPIN) 0.5 MG tablet; Take 1 tablet (0.5 mg total) by mouth 2 (two) times daily as needed for anxiety. -     DULoxetine (CYMBALTA) 30 MG capsule; Take 1 capsule (30 mg total) by mouth daily.  Essential hypertension, benign   Toradol given today for overall muscle pain. Soma for muscle relaxer. Do not use daily but as needed.   Discussed importance of complaince with medications for mood. Restart cymbalta and buspar. Use clonazepam for panic attacks only.   Elevated blood pressure today. Pt not checking at home but in a pretty stressful environment.   Wrote letter for patient to work no more than 20 hours a week.   Recheck in 1-2 month.   Marland Kitchen.Spent 30 minutes with patient and greater than 50 percent of visit spent counseling patient regarding treatment plan.

## 2018-06-14 ENCOUNTER — Encounter: Payer: Self-pay | Admitting: Physician Assistant

## 2018-06-28 ENCOUNTER — Ambulatory Visit (INDEPENDENT_AMBULATORY_CARE_PROVIDER_SITE_OTHER): Payer: Self-pay | Admitting: Physician Assistant

## 2018-06-28 ENCOUNTER — Encounter: Payer: Self-pay | Admitting: Physician Assistant

## 2018-06-28 VITALS — BP 143/71 | HR 72 | Ht 69.0 in | Wt 250.0 lb

## 2018-06-28 DIAGNOSIS — H6692 Otitis media, unspecified, left ear: Secondary | ICD-10-CM

## 2018-06-28 DIAGNOSIS — F332 Major depressive disorder, recurrent severe without psychotic features: Secondary | ICD-10-CM

## 2018-06-28 MED ORDER — AZITHROMYCIN 250 MG PO TABS: ORAL_TABLET | ORAL | 0 refills | Status: DC

## 2018-06-28 NOTE — Patient Instructions (Signed)
Otitis Media, Adult Otitis media is redness, soreness, and puffiness (swelling) in the space just behind your eardrum (middle ear). It may be caused by allergies or infection. It often happens along with a cold. Follow these instructions at home:  Take your medicine as told. Finish it even if you start to feel better.  Only take over-the-counter or prescription medicines for pain, discomfort, or fever as told by your doctor.  Follow up with your doctor as told. Contact a doctor if:  You have otitis media only in one ear, or bleeding from your nose, or both.  You notice a lump on your neck.  You are not getting better in 3-5 days.  You feel worse instead of better. Get help right away if:  You have pain that is not helped with medicine.  You have puffiness, redness, or pain around your ear.  You get a stiff neck.  You cannot move part of your face (paralysis).  You notice that the bone behind your ear hurts when you touch it. This information is not intended to replace advice given to you by your health care provider. Make sure you discuss any questions you have with your health care provider. Document Released: 12/30/2007 Document Revised: 12/19/2015 Document Reviewed: 02/07/2013 Elsevier Interactive Patient Education  2017 Elsevier Inc.  

## 2018-06-29 DIAGNOSIS — F1210 Cannabis abuse, uncomplicated: Secondary | ICD-10-CM | POA: Insufficient documentation

## 2018-07-01 DIAGNOSIS — F332 Major depressive disorder, recurrent severe without psychotic features: Secondary | ICD-10-CM | POA: Insufficient documentation

## 2018-07-02 DIAGNOSIS — F17200 Nicotine dependence, unspecified, uncomplicated: Secondary | ICD-10-CM | POA: Insufficient documentation

## 2018-07-04 NOTE — Progress Notes (Signed)
Subjective:    Patient ID: Margaret Lawson, female    DOB: [birth year], 37 y.o.   MRN: [id number]  HPI  Pt is a 37 yo female with fibromyalgia, chronic pain, MDD, GAD, bipolar who pesents to the the clinic feeling very hopeless today.   She has not taken any medications due to cost in over a month. She was feeling some better after being compliant with meds but then when she cut back her hours at work and her husband is not working like he had been money has been hard to come by. She has .35 cents in her name. They are behind on bills. They have been eating a lot of beans and rice. They cannot afford to feel their 4 animals. She feels like a complete failure. Her husband is very supportive but feels like he doesn't see how desperate they really are. She 'wishes she did not live this life", "she wonders what good am I here" but she has not developed any pain of harm of herself. She comes in to get resources for help.   Left ear pain- going on for 3 days. Noticed pain and popping sensation. No fever, chills, body aches. She has had URI for about 2 weeks. Taking OTC tylenol cold sinus severe with some URI relief but left ear conitnues to be painful. Having trouble earing out of if also.   .. Active Ambulatory Problems    Diagnosis Date Noted  . History of nephrolithiasis 12/27/2013  . Asthma, chronic 12/27/2013  . Essential hypertension, benign 12/27/2013  . Fibromyalgia 12/27/2013  . Muscle spasm 12/27/2013  . GERD (gastroesophageal reflux disease) 12/27/2013  . Leukocytosis 12/27/2013  . Hyperlipidemia 01/03/2014  . Other migraine without status migrainosus, not intractable 03/07/2015  . 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 History of cholecystectomy 09/11/2010  . Ovarian cyst, right 07/15/2015  . Amenorrhea 07/15/2015  . Generalized anxiety disorder 09/10/2015  . Abnormal uterine bleeding (AUB) 09/10/2015  . Hypokalemia 09/27/2015  . Benign abdominal serous tumor  12/10/2015  . Surgical menopause 04/23/2016  . Cervical spondylosis without myelopathy 04/23/2016  . Nipple discharge in female 08/03/2016  . Anorectal skin tags 08/03/2016  . Loose stools 08/03/2016  . Bipolar 2 disorder, major depressive episode ([place]) 11/13/2016  . No energy 11/13/2016  . Frequent headaches 11/15/2016  . Family history of brain aneurysm 11/15/2016  . Moderate persistent asthma without complication 66/12/3014  . Functional diarrhea 12/03/2016  . Fatigue 02/13/2018  . Inattention 02/13/2018  . Hot flashes 02/26/2018  . Panic attacks 03/30/2018  . Insomnia due to other mental disorder 03/30/2018   Resolved Ambulatory Problems    Diagnosis Date Noted  . Perforation of left tympanic membrane 12/27/2013  . Bipolar I disorder, most recent episode depressed ([place]) 10/11/2014  . Post-operative state 09/10/2015  . Acute tonsillitis 04/23/2016  . Acute non-recurrent maxillary sinusitis 12/03/2016   Past Medical History:  Diagnosis Date  . Anxiety   . Bipolar 2 disorder ([place])   . Headache   . Low iron   . PCOS (polycystic ovarian syndrome)   . Poor dentition   . Restless leg syndrome   . Right leg pain   . Teeth clenching   . Teeth grinding   . Weakness      Review of Systems   see HPI.  Objective:   Physical Exam  Constitutional: She is oriented to person, place, and time. She appears well-developed and well-nourished.  HENT:  Head: Normocephalic and atraumatic.  Right Ear: External ear normal.  Left Ear: External ear normal.  Nose: Nose normal.  Mouth/Throat: Oropharynx is clear and moist.  Left TM erythematous with purulent white pus behind TM.   Eyes: Conjunctivae are normal.  Neck: Normal range of motion. Neck supple.  Cardiovascular: Normal rate and regular rhythm.  Pulmonary/Chest: Effort normal and breath sounds normal.  Neurological: She is alert and oriented to person, place, and time.  Psychiatric:  Tearful and flat affect.            Assessment & Plan:  Marland KitchenMarland KitchenDiagnoses and all orders for this visit:  Severe episode of recurrent major depressive disorder, without psychotic features ([place])  Left otitis media, unspecified otitis media type -     azithromycin (ZITHROMAX) 250 MG tablet; Take 2 tablets now and then one tablet for 4 days.   For food:  Printed out all the local church food banks she could get food from.   For medication: [facility].  Looked through medications and a lot her her medication is cheap through [facility] if she comes into a little bit of money.   Looked into [facility]. Not accepting any new patients.   Discussed if thoughts of death/hard to go to behavioral health ED.discussed possible moving in with someone while they get back on their feet.   Marland Kitchen.Spent 30 minutes with patient and greater than 50 percent of visit spent counseling patient regarding treatment plan.

## 2018-07-08 DIAGNOSIS — F1220 Cannabis dependence, uncomplicated: Secondary | ICD-10-CM | POA: Insufficient documentation

## 2018-07-08 DIAGNOSIS — F1020 Alcohol dependence, uncomplicated: Secondary | ICD-10-CM | POA: Insufficient documentation

## 2018-07-08 DIAGNOSIS — F17200 Nicotine dependence, unspecified, uncomplicated: Secondary | ICD-10-CM | POA: Insufficient documentation

## 2018-07-08 DIAGNOSIS — F1521 Other stimulant dependence, in remission: Secondary | ICD-10-CM | POA: Insufficient documentation

## 2018-07-08 DIAGNOSIS — F1411 Cocaine abuse, in remission: Secondary | ICD-10-CM | POA: Insufficient documentation

## 2018-07-14 ENCOUNTER — Encounter: Payer: Self-pay | Admitting: Physician Assistant

## 2018-07-14 DIAGNOSIS — H6692 Otitis media, unspecified, left ear: Secondary | ICD-10-CM

## 2018-07-15 ENCOUNTER — Other Ambulatory Visit: Payer: Self-pay | Admitting: Physician Assistant

## 2018-07-15 MED ORDER — AZITHROMYCIN 250 MG PO TABS: ORAL_TABLET | ORAL | 0 refills | Status: DC

## 2018-07-15 MED ORDER — FLUCONAZOLE 150 MG PO TABS: 150.0000 mg | ORAL_TABLET | Freq: Once | ORAL | 0 refills | Status: AC

## 2018-08-05 ENCOUNTER — Encounter: Payer: Self-pay | Admitting: Physician Assistant

## 2018-08-05 DIAGNOSIS — I10 Essential (primary) hypertension: Secondary | ICD-10-CM

## 2018-08-05 DIAGNOSIS — E8940 Asymptomatic postprocedural ovarian failure: Secondary | ICD-10-CM

## 2018-08-05 DIAGNOSIS — R232 Flushing: Secondary | ICD-10-CM

## 2018-08-05 MED ORDER — ATENOLOL 100 MG PO TABS: 100.0000 mg | ORAL_TABLET | Freq: Every day | ORAL | 5 refills | Status: DC

## 2018-08-05 MED ORDER — ESTRADIOL 0.5 MG PO TABS: 0.5000 mg | ORAL_TABLET | Freq: Every day | ORAL | 5 refills | Status: DC

## 2018-08-08 ENCOUNTER — Ambulatory Visit: Payer: Self-pay | Admitting: Physician Assistant

## 2018-08-09 ENCOUNTER — Encounter: Payer: Self-pay | Admitting: Physician Assistant

## 2018-08-09 ENCOUNTER — Ambulatory Visit (INDEPENDENT_AMBULATORY_CARE_PROVIDER_SITE_OTHER): Payer: Self-pay | Admitting: Physician Assistant

## 2018-08-09 VITALS — BP 128/66 | HR 106 | Temp 99.4°F | Ht 69.0 in | Wt 270.0 lb

## 2018-08-09 DIAGNOSIS — R059 Cough, unspecified: Secondary | ICD-10-CM

## 2018-08-09 DIAGNOSIS — R509 Fever, unspecified: Secondary | ICD-10-CM

## 2018-08-09 DIAGNOSIS — J4541 Moderate persistent asthma with (acute) exacerbation: Secondary | ICD-10-CM

## 2018-08-09 DIAGNOSIS — J209 Acute bronchitis, unspecified: Secondary | ICD-10-CM

## 2018-08-09 DIAGNOSIS — R05 Cough: Secondary | ICD-10-CM

## 2018-08-09 LAB — POCT INFLUENZA A/B
Influenza A, POC: NEGATIVE
Influenza B, POC: NEGATIVE

## 2018-08-09 MED ORDER — HYDROCODONE-HOMATROPINE 5-1.5 MG/5ML PO SYRP: 5.0000 mL | ORAL_SOLUTION | Freq: Two times a day (BID) | ORAL | 0 refills | Status: DC | PRN

## 2018-08-09 MED ORDER — PREDNISONE 50 MG PO TABS: ORAL_TABLET | ORAL | 0 refills | Status: DC

## 2018-08-09 NOTE — Patient Instructions (Addendum)
Keep using albuterol inhalers and duonebs as needed up to ever 4-6 hours.  Consider humidifer. Continue mucinex, flonase, delsym.  Cough syrup at bedtime.  Prednisone burst.  Call Friday if not improving.    Acute Bronchitis, Adult Acute bronchitis is when air tubes (bronchi) in the lungs suddenly get swollen. The condition can make it hard to breathe. It can also cause these symptoms:  A cough.  Coughing up clear, yellow, or green mucus.  Wheezing.  Chest congestion.  Shortness of breath.  A fever.  Body aches.  Chills.  A sore throat. Follow these instructions at home:  Medicines  Take over-the-counter and prescription medicines only as told by your doctor.  If you were prescribed an antibiotic medicine, take it as told by your doctor. Do not stop taking the antibiotic even if you start to feel better. General instructions  Rest.  Drink enough fluids to keep your pee (urine) pale yellow.  Avoid smoking and secondhand smoke. If you smoke and you need help quitting, ask your doctor. Quitting will help your lungs heal faster.  Use an inhaler, cool mist vaporizer, or humidifier as told by your doctor.  Keep all follow-up visits as told by your doctor. This is important. How is this prevented? To lower your risk of getting this condition again:  Wash your hands often with soap and water. If you cannot use soap and water, use hand sanitizer.  Avoid contact with people who have cold symptoms.  Try not to touch your hands to your mouth, nose, or eyes.  Make sure to get the flu shot every year. Contact a doctor if:  Your symptoms do not get better in 2 weeks. Get help right away if:  You cough up blood.  You have chest pain.  You have very bad shortness of breath.  You become dehydrated.  You faint (pass out) or keep feeling like you are going to pass out.  You keep throwing up (vomiting).  You have a very bad headache.  Your fever or chills gets  worse. This information is not intended to replace advice given to you by your health care provider. Make sure you discuss any questions you have with your health care provider. Document Released: 12/30/2007 Document Revised: 02/24/2017 Document Reviewed: 01/01/2016 Elsevier Interactive Patient Education  2019 [employer].

## 2018-08-09 NOTE — Progress Notes (Deleted)
Subjective:    Patient ID: Margaret Lawson, female    DOB: 1981/04/05, 38 y.o.   MRN: [id number]  HPI  Pt is a 38 yo female with asthma who presents to the clinic with sudden symptoms of fever, chills, SOB, cough that started yesterday. She had not felt 100 percent for about a week but yesterday suddenly worsened. Last night she could not sleep due to cough. Fever ranged from 101-102. Coughing up some sputum yellow to clear. No other known sick contacts.  She has had to use albuterol inhaler and duoneb multiple times a day to get relief. She is taking flonase, mucinex OTC. Her chest is very tight.  Today she feels a little better.   .. Active Ambulatory Problems    Diagnosis Date Noted  . History of nephrolithiasis 12/27/2013  . Asthma, chronic 12/27/2013  . Essential hypertension, benign 12/27/2013  . Fibromyalgia 12/27/2013  . Muscle spasm 12/27/2013  . GERD (gastroesophageal reflux disease) 12/27/2013  . Leukocytosis 12/27/2013  . Hyperlipidemia 01/03/2014  . Other migraine without status migrainosus, not intractable 03/07/2015  . 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 History of cholecystectomy 09/11/2010  . Ovarian cyst, right 07/15/2015  . Amenorrhea 07/15/2015  . Generalized anxiety disorder 09/10/2015  . Abnormal uterine bleeding (AUB) 09/10/2015  . Hypokalemia 09/27/2015  . 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 Surgical menopause 04/23/2016  . Cervical spondylosis without myelopathy 04/23/2016  . Nipple discharge in female 08/03/2016  . Anorectal skin tags 08/03/2016  . Loose stools 08/03/2016  . Bipolar 2 disorder, major depressive episode ([place]) 11/13/2016  . No energy 11/13/2016  . Frequent headaches 11/15/2016  . Family history of brain aneurysm 11/15/2016  . Moderate persistent asthma without complication 78/24/2353  . Functional diarrhea 12/03/2016  . Fatigue 02/13/2018  . Inattention 02/13/2018  . Hot flashes 02/26/2018  . Panic  attacks 03/30/2018  . Insomnia due to other mental disorder 03/30/2018   Resolved Ambulatory Problems    Diagnosis Date Noted  . Perforation of left tympanic membrane 12/27/2013  . Bipolar I disorder, most recent episode depressed ([place]) 10/11/2014  . Post-operative state 09/10/2015  . Acute tonsillitis 04/23/2016  . Acute non-recurrent maxillary sinusitis 12/03/2016   Past Medical History:  Diagnosis Date  . Anxiety   . Bipolar 2 disorder ([place])   . Headache   . Low iron   . PCOS (polycystic ovarian syndrome)   . Poor dentition   . Restless leg syndrome   . Right leg pain   . Teeth clenching   . Teeth grinding   . Weakness     Review of Systems  All other systems reviewed and are negative.      Objective:   Physical Exam Vitals signs reviewed.  Constitutional:      Appearance: Normal appearance.  HENT:     Head: Normocephalic and atraumatic.     Right Ear: Ear canal normal.     Left Ear: Tympanic membrane and ear canal normal.     Ears:     Comments: Right perforation central.  Left with tympanosclerosis.     Nose: Nose normal.     Mouth/Throat:     Mouth: Mucous membranes are moist.     Pharynx: No oropharyngeal exudate or posterior oropharyngeal erythema.  Eyes:     Pupils: Pupils are equal, round, and reactive to light.  Cardiovascular:     Rate and Rhythm: Normal rate and regular rhythm.     Pulses: Normal pulses.  Heart sounds: Normal heart sounds.  Pulmonary:     Comments: Deep breathing cause production cough.  Rhonchi cleared with cough.   Neurological:     General: No focal deficit present.     Mental Status: She is alert and oriented to person, place, and time.  Psychiatric:        Mood and Affect: Mood normal.        Behavior: Behavior normal.           Assessment & Plan:

## 2018-08-09 NOTE — Progress Notes (Signed)
Subjective:    Patient ID: Margaret Lawson, female    DOB: 1980-12-19, 38 y.o.   MRN: [id number]  HPI: This is a 38 year old female with hx of asthma who presents with a chief complaint of a productive cough for one week. She states that her symtpoms have worsened within the last 24 hours and she is now feeling short of breath and reports a fever of 102. She denies sore throat, rhinorrhea, sinus pressure, sick contacts, chest pain, vomiting, and diarrhea. She has been using Mucinex, Duoneb, and flonase with some symptomatic relief.     Review of Systems  Constitutional: Positive for fatigue and fever. Negative for appetite change.  HENT: Negative for congestion, ear pain, rhinorrhea, sinus pressure, sinus pain, sneezing and sore throat.   Eyes: Negative for discharge and itching.  Respiratory: Positive for cough, chest tightness and shortness of breath.   Cardiovascular: Negative for chest pain and palpitations.  Gastrointestinal: Negative for diarrhea, nausea and vomiting.  Musculoskeletal: Negative for arthralgias, myalgias and neck pain.  Skin: Negative for color change and rash.  Psychiatric/Behavioral: Positive for sleep disturbance. Negative for agitation and decreased concentration.       Objective:   Physical Exam Vitals signs reviewed.  Constitutional:      General: She is not in acute distress.    Appearance: She is not ill-appearing.  HENT:     Head: Normocephalic and atraumatic.     Right Ear: Ear canal and external ear normal. There is no impacted cerumen.     Left Ear: Ear canal and external ear normal. There is no impacted cerumen.     Ears:     Comments: Right TM peforation central Left TM with sclerosis    Nose: Nose normal. No congestion or rhinorrhea.     Mouth/Throat:     Mouth: Mucous membranes are moist.     Pharynx: Oropharynx is clear. No oropharyngeal exudate or posterior oropharyngeal erythema.  Eyes:     General: No scleral icterus.       Right  eye: No discharge.        Left eye: No discharge.     Extraocular Movements: Extraocular movements intact.     Conjunctiva/sclera: Conjunctivae normal.     Pupils: Pupils are equal, round, and reactive to light.  Neck:     Musculoskeletal: Normal range of motion and neck supple. No neck rigidity or muscular tenderness.  Cardiovascular:     Rate and Rhythm: Tachycardia present.     Pulses: Normal pulses.     Heart sounds: Normal heart sounds.  Pulmonary:     Effort: Pulmonary effort is normal. No respiratory distress.     Breath sounds: No stridor. Wheezing and rhonchi present. No rales.  Lymphadenopathy:     Cervical: No cervical adenopathy.  Skin:    General: Skin is warm and dry.     Capillary Refill: Capillary refill takes less than 2 seconds.     Coloration: Skin is not jaundiced.     Findings: No bruising or rash.  Neurological:     General: No focal deficit present.     Mental Status: She is alert.     Coordination: Coordination normal.     Gait: Gait normal.  Psychiatric:        Mood and Affect: Mood normal.        Behavior: Behavior normal.        Thought Content: Thought content normal.  Assessment & Plan:  Marland KitchenMarland KitchenDiagnoses and all orders for this visit:  Moderate persistent asthma with acute bronchitis and acute exacerbation -     predniSONE (DELTASONE) 50 MG tablet; Take one tablet for 5 days. -     HYDROcodone-homatropine (HYCODAN) 5-1.5 MG/5ML syrup; Take 5 mLs by mouth every 12 (twelve) hours as needed for cough.  Fever, unspecified fever cause -     POCT Influenza A/B  Cough -     POCT Influenza A/B -     HYDROcodone-homatropine (HYCODAN) 5-1.5 MG/5ML syrup; Take 5 mLs by mouth every 12 (twelve) hours as needed for cough.   .. Results for orders placed or performed in visit on 08/09/18  POCT Influenza A/B  Result Value Ref Range   Influenza A, POC Negative Negative   Influenza B, POC Negative Negative    given diffuse wheezing in all lung  fields. Patient is self pay and wishes to forgo CXR at this time. Flu swab negative. At this time I do not see any bacterial causes of infection. -Continue Flonase, Mucinex, Duoneb Q4-6 hr PRN for symptomatic relief -Prednisone 50 mg PO QD x 5 days.  -hycodan small quanity given for cough at bedtime. Sedation warning discussed.  -Call Friday if symptoms are not improved or sooner if symptoms worsen.  Marland KitchenVernetta Honey PA-C, have reviewed and agree with the above documentation in it's entirety.

## 2018-08-23 ENCOUNTER — Encounter: Payer: Self-pay | Admitting: Physician Assistant

## 2018-08-23 ENCOUNTER — Other Ambulatory Visit: Payer: Self-pay | Admitting: Physician Assistant

## 2018-08-23 DIAGNOSIS — B009 Herpesviral infection, unspecified: Secondary | ICD-10-CM

## 2018-08-23 MED ORDER — GABAPENTIN 400 MG PO CAPS: 400.0000 mg | ORAL_CAPSULE | Freq: Four times a day (QID) | ORAL | 5 refills | Status: DC

## 2018-08-23 MED ORDER — VALACYCLOVIR HCL 500 MG PO TABS: 500.0000 mg | ORAL_TABLET | Freq: Every day | ORAL | 5 refills | Status: DC

## 2018-08-23 NOTE — Addendum Note (Signed)
Addended by: Donella Stade on: 08/23/2018 01:14 PM   Modules accepted: Orders

## 2018-10-14 ENCOUNTER — Other Ambulatory Visit: Payer: Self-pay | Admitting: Physician Assistant

## 2018-10-14 DIAGNOSIS — F410 Panic disorder [episodic paroxysmal anxiety] without agoraphobia: Secondary | ICD-10-CM

## 2018-10-14 NOTE — Telephone Encounter (Signed)
Last RF 06/07/18 for #30 with 1 RF   Last OV 08/09/18  RX pended

## 2018-11-18 ENCOUNTER — Encounter: Payer: Self-pay | Admitting: Physician Assistant

## 2019-01-02 ENCOUNTER — Encounter: Payer: Self-pay | Admitting: Physician Assistant

## 2019-01-11 ENCOUNTER — Encounter: Payer: Self-pay | Admitting: Family Medicine

## 2019-01-11 ENCOUNTER — Ambulatory Visit (INDEPENDENT_AMBULATORY_CARE_PROVIDER_SITE_OTHER): Payer: Self-pay | Admitting: Family Medicine

## 2019-01-11 VITALS — BP 144/81 | HR 72 | Temp 98.5°F | Wt 292.0 lb

## 2019-01-11 DIAGNOSIS — E8940 Asymptomatic postprocedural ovarian failure: Secondary | ICD-10-CM

## 2019-01-11 DIAGNOSIS — E876 Hypokalemia: Secondary | ICD-10-CM

## 2019-01-11 DIAGNOSIS — R600 Localized edema: Secondary | ICD-10-CM

## 2019-01-11 DIAGNOSIS — I10 Essential (primary) hypertension: Secondary | ICD-10-CM

## 2019-01-11 DIAGNOSIS — K219 Gastro-esophageal reflux disease without esophagitis: Secondary | ICD-10-CM

## 2019-01-11 MED ORDER — TRIAMTERENE-HCTZ 37.5-25 MG PO TABS: 1.0000 | ORAL_TABLET | Freq: Every day | ORAL | 0 refills | Status: DC

## 2019-01-11 MED ORDER — PANTOPRAZOLE SODIUM 40 MG PO TBEC: 40.0000 mg | DELAYED_RELEASE_TABLET | Freq: Every day | ORAL | 3 refills | Status: DC

## 2019-01-11 MED ORDER — POTASSIUM CHLORIDE CRYS ER 10 MEQ PO TBCR: 10.0000 meq | EXTENDED_RELEASE_TABLET | Freq: Two times a day (BID) | ORAL | 1 refills | Status: DC

## 2019-01-11 MED ORDER — ESTRADIOL 1 MG PO TABS: 1.0000 mg | ORAL_TABLET | Freq: Every day | ORAL | 1 refills | Status: DC

## 2019-01-11 MED ORDER — FUROSEMIDE 20 MG PO TABS: 20.0000 mg | ORAL_TABLET | Freq: Every day | ORAL | 3 refills | Status: DC | PRN

## 2019-01-11 NOTE — Progress Notes (Signed)
Note duplication error 

## 2019-01-11 NOTE — Progress Notes (Signed)
Margaret Lawson is a 38 y.o. female who presents to [facility] today for leg swelling, estrogen, acid reflux, hypertension.  Patient notes bilateral leg swelling and edema.  This is occurred in the past off and on.  It is worsened recently.  She notes that she has been out of her Maxide.  She takes Maxide her swelling is usually reasonably controlled but not fully controlled.  She also took some leftover Lasix which helps.  20 mg causes significant diuresis and does help her leg swelling.  She does not currently use compression stockings nor eat a careful low-salt diet.  She denies chest pain palpitations or significant shortness of breath.  She denies significant orthopnea.  Her kidney function was last checked in December during the hospitalization and was normal.  She had normal kidney function for multiple checks previously.  Additionally she notes a history of acid reflux.  She is taking over-the-counter famotidine which is not controlling it well.  She notes regurgitation and burning.  In the past she is done well with Protonix and would like a refill if possible.  Patient she notes that she needs a refill of estradiol.  She takes 1 mg daily.  She has history of hysterectomy and oophorectomy with surgical menopause.  Hypertension: Off of Maxide as above.  Patient does have the ability to check her blood pressure at home but does not check it regularly. ROS as above:  Exam:  BP (!) 144/81   Pulse 72   Temp 98.5 F (36.9 C)   Wt 292 lb (132.5 kg)   LMP 07/28/2015   BMI 43.12 kg/m  Wt Readings from Last 5 Encounters:  01/11/19 292 lb (132.5 kg)  08/09/18 270 lb (122.5 kg)  06/28/18 250 lb (113.4 kg)  06/07/18 248 lb (112.5 kg)  03/17/18 248 lb (112.5 kg)    Gen: Well NAD HEENT: EOMI,  MMM Lungs: Normal work of breathing. CTABL Heart: RRR no MRG Abd: NABS,  Soft. Nondistended, Nontender Exts: Brisk capillary refill, warm and well perfused.  1+ pitting lower extremity edema.  Calf diameters equal bilaterally.  Lab and Radiology Results Labs reviewed from care everywhere with normal creatinine from December.   Assessment and Plan: 38 y.o. female with  Lower extremity edema.  Exacerbation of chronic issue.  Renal function checked relatively recently.  Plan to resume Maxide.  Also use Lasix as needed.  Refill potassium as well as has been hypokalemic in the past.  Will check metabolic panel in 3 weeks.  Hypertension: Blood pressure elevated.  Start Maxide as above.  Acid reflux: Refill Protonix.  Surgical menopause.  Refill estradiol.  Follow-up with PCP.  PDMP not reviewed this encounter. Orders Placed This Encounter  Procedures  . BASIC METABOLIC PANEL WITH GFR   Meds ordered this encounter  Medications  . triamterene-hydrochlorothiazide (MAXZIDE-25) 37.5-25 MG tablet    Sig: Take 1 tablet by mouth daily.    Dispense:  90 tablet    Refill:  0  . estradiol (ESTRACE) 1 MG tablet    Sig: Take 1 tablet (1 mg total) by mouth daily.    Dispense:  90 tablet    Refill:  1  . furosemide (LASIX) 20 MG tablet    Sig: Take 1 tablet (20 mg total) by mouth daily as needed for edema.    Dispense:  30 tablet    Refill:  3  . pantoprazole (PROTONIX) 40 MG tablet  Sig: Take 1 tablet (40 mg total) by mouth daily.    Dispense:  90 tablet    Refill:  3     Historical information moved to improve visibility of documentation.  Past Medical History:  Diagnosis Date  . Anxiety    severe  . Asthma, chronic 12/27/2013  . Bipolar 2 disorder ([place])   . Essential hypertension, benign 12/27/2013  . Fibromyalgia 12/27/2013  . GERD (gastroesophageal reflux disease)   . Headache    migraines, over stimulation  . Leukocytosis 12/27/2013  . Low iron   . PCOS (polycystic ovarian syndrome)   . Perforation of left tympanic membrane 12/27/2013  . Poor  dentition    right side upper and lower  . Restless leg syndrome   . Right leg pain   . Teeth clenching   . Teeth grinding   . Weakness    when exposed to hot or cold temperatures   Past Surgical History:  Procedure Laterality Date  . CHOLECYSTECTOMY    . ROBOTIC ASSISTED TOTAL HYSTERECTOMY WITH BILATERAL SALPINGO OOPHERECTOMY Bilateral 09/10/2015   Procedure: ROBOTIC ASSISTED TOTAL HYSTERECTOMY WITH BILATERAL SALPINGECTOMY;  Surgeon: Lavonia Drafts, MD;  Location: [facility];  Service: Gynecology;  Laterality: Bilateral;  . WISDOM TOOTH EXTRACTION     Social History   Tobacco Use  . Smoking status: Current Every Day Smoker    Packs/day: 1.00    Years: 20.00    Pack years: 20.00    Types: Cigarettes  . Smokeless tobacco: Never Used  . Tobacco comment: uses 6mg  vapes  Substance Use Topics  . Alcohol use: No    Alcohol/week: 0.0 standard drinks   family history includes Aneurysm in her mother; Breast cancer in her paternal aunt and paternal grandfather; Depression in her father and sister; Diabetes in her father; Heart disease in her father; Hypertension in her father; Mitral valve prolapse in her father.  Medications: Current Outpatient Medications  Medication Sig Dispense Refill  . busPIRone (BUSPAR) 7.5 MG tablet Take 7.5 mg by mouth 3 (three) times daily.    Marland Kitchen estradiol (ESTRACE) 1 MG tablet Take 1 tablet (1 mg total) by mouth daily. 90 tablet 1  . potassium chloride (K-DUR) 10 MEQ tablet Take 1 tablet by mouth 2 (two) times daily.    . traZODone (DESYREL) 100 MG tablet Take 1 tablet by mouth at bedtime.    Marland Kitchen acetaminophen (TYLENOL) 500 MG tablet Take 500 mg by mouth every 6 (six) hours as needed for mild pain or moderate pain.    Marland Kitchen albuterol (PROVENTIL HFA;VENTOLIN HFA) 108 (90 Base) MCG/ACT inhaler Inhale 2 puffs into the lungs every 4 (four) hours as needed for wheezing. 18 g 4  . albuterol (PROVENTIL) (2.5 MG/3ML) 0.083% nebulizer solution Take 3 mLs (2.5 mg total)  by nebulization every 4 (four) hours as needed for wheezing. 75 mL 0  . atenolol (TENORMIN) 100 MG tablet Take 1 tablet (100 mg total) by mouth daily. 30 tablet 5  . furosemide (LASIX) 20 MG tablet Take 1 tablet (20 mg total) by mouth daily as needed for edema. 30 tablet 3  . gabapentin (NEURONTIN) 400 MG capsule Take 1 capsule (400 mg total) by mouth 4 (four) times daily. 120 capsule 5  . hydrOXYzine (VISTARIL) 50 MG capsule TAKE 1 CAPSULE BY MOUTH THREE TIMES DAILY AS NEEDED FOR ANXIETY    . MELATONIN PO Take by mouth.    . mirtazapine (REMERON) 15 MG tablet Take 15 mg by mouth at bedtime.    Marland Kitchen  Multiple Vitamin (MULTIVITAMIN) capsule Take 1 capsule by mouth daily.    . pantoprazole (PROTONIX) 40 MG tablet Take 1 tablet (40 mg total) by mouth daily. 90 tablet 3  . risperiDONE (RISPERDAL) 1 MG tablet Take 1 mg by mouth 3 (three) times daily.    Marland Kitchen triamterene-hydrochlorothiazide (MAXZIDE-25) 37.5-25 MG tablet Take 1 tablet by mouth daily. 90 tablet 0  . valACYclovir (VALTREX) 500 MG tablet Take 1 tablet (500 mg total) by mouth daily. 30 tablet 5   No current facility-administered medications for this visit.    Allergies  Allergen Reactions  . Adhesive [Tape] Other (See Comments)    Blisters, paper tape is worse  . Ambien [Zolpidem Tartrate]     Insomnia.  . Celecoxib Nausea And Vomiting  . Doxycycline     Double vision   . Erythromycin Nausea And Vomiting  . Lyrica [Pregabalin]     Induced lactation  . Morphine And Related     Decrease bp  . Penicillins Hives    Has patient had a PCN reaction causing immediate rash, facial/tongue/throat swelling, SOB or lightheadedness with hypotension: Yes Has patient had a PCN reaction causing severe rash involving mucus membranes or skin necrosis: No Has patient had a PCN reaction that required hospitalization No Has patient had a PCN reaction occurring within the last 10 years: Yes If all of the above answers are "NO", then may proceed with  Cephalosporin use.   . Latex Rash     Discussed warning signs or symptoms. Please see discharge instructions. Patient expresses understanding.

## 2019-01-11 NOTE — Patient Instructions (Addendum)
Thank you for coming in today.  Restart Maxzide for fluid and blood pressure.  Use lasix as needed daily.  Keep track of weights and fluid status.  Use compressions stockings if able.  Try to keep salt 2000mg  per day.  Keep [place] updated.  Get labs in 3 weeks.     Edema  Edema is when you have too much fluid in your body or under your skin. Edema may make your legs, feet, and ankles swell up. Swelling is also common in looser tissues, like around your eyes. This is a common condition. It gets more common as you get older. There are many possible causes of edema. Eating too much salt (sodium) and being on your feet or sitting for a long time can cause edema in your legs, feet, and ankles. Hot weather may make edema worse. Edema is usually painless. Your skin may look swollen or shiny. Follow these instructions at home:  Keep the swollen body part raised (elevated) above the level of your heart when you are sitting or lying down.  Do not sit still or stand for a long time.  Do not wear tight clothes. Do not wear garters on your upper legs.  Exercise your legs. This can help the swelling go down.  Wear elastic bandages or support stockings as told by your doctor.  Eat a low-salt (low-sodium) diet to reduce fluid as told by your doctor.  Depending on the cause of your swelling, you may need to limit how much fluid you drink (fluid restriction).  Take over-the-counter and prescription medicines only as told by your doctor. Contact a doctor if:  Treatment is not working.  You have heart, liver, or kidney disease and have symptoms of edema.  You have sudden and unexplained weight gain. Get help right away if:  You have shortness of breath or chest pain.  You cannot breathe when you lie down.  You have pain, redness, or warmth in the swollen areas.  You have heart, liver, or kidney disease and get edema all of a sudden.  You have a fever and your symptoms get worse all of a  sudden. Summary  Edema is when you have too much fluid in your body or under your skin.  Edema may make your legs, feet, and ankles swell up. Swelling is also common in looser tissues, like around your eyes.  Raise (elevate) the swollen body part above the level of your heart when you are sitting or lying down.  Follow your doctor's instructions about diet and how much fluid you can drink (fluid restriction). This information is not intended to replace advice given to you by your health care provider. Make sure you discuss any questions you have with your health care provider. Document Released: 12/30/2007 Document Revised: 07/31/2016 Document Reviewed: 07/31/2016 Elsevier Interactive Patient Education  2019 [employer].

## 2019-02-07 ENCOUNTER — Encounter: Payer: Self-pay | Admitting: Family Medicine

## 2019-04-26 ENCOUNTER — Ambulatory Visit (INDEPENDENT_AMBULATORY_CARE_PROVIDER_SITE_OTHER): Payer: Self-pay | Admitting: Physician Assistant

## 2019-04-26 ENCOUNTER — Inpatient Hospital Stay: Payer: Self-pay | Admitting: Physician Assistant

## 2019-04-26 ENCOUNTER — Other Ambulatory Visit: Payer: Self-pay

## 2019-04-26 VITALS — BP 151/86 | HR 78 | Temp 98.2°F | Ht 69.0 in | Wt 286.0 lb

## 2019-04-26 DIAGNOSIS — J329 Chronic sinusitis, unspecified: Secondary | ICD-10-CM

## 2019-04-26 DIAGNOSIS — J4541 Moderate persistent asthma with (acute) exacerbation: Secondary | ICD-10-CM

## 2019-04-26 DIAGNOSIS — J40 Bronchitis, not specified as acute or chronic: Secondary | ICD-10-CM

## 2019-04-26 MED ORDER — PREDNISONE 20 MG PO TABS: ORAL_TABLET | ORAL | 0 refills | Status: DC

## 2019-04-26 MED ORDER — AZITHROMYCIN 250 MG PO TABS: ORAL_TABLET | ORAL | 0 refills | Status: DC

## 2019-04-26 NOTE — Progress Notes (Signed)
Subjective:    Patient ID: Margaret Lawson, female    DOB: [birth year], 38 y.o.   MRN: [id number]  HPI  Pt is a 38 yo female with asthma, HTN who comes into the clinic to follow up after ED visit for dyspnea on 04/19/19. She had CXR with no acute findings. She was given medrol dose pack and duoneb. She finished medrol dose pack yesterday. She continues to do duoneb every 6hours. She is feeling better. Pulse ox staying around 94 percent. She continues to feel SOB and "wheezy". She admits to trying to switch over from smoking to vaping. She is having very little sinus pressure but does feel some sinus drainage. She continues to cough dry to productive. No fever, chills, body aches, loss of smell or taste, GI symptoms. She is covid negative from ED.   Pt does not use inhalers to control asthma. She has not needed those in the past. She usually has 1-2 flares a year.   Pt is taking estrogen due to surgical menopause. She denies any leg swelling. She denies any recent surgeries or travel.   BP elevated today. No CP, palpitations.   .. Active Ambulatory Problems    Diagnosis Date Noted  . History of nephrolithiasis 12/27/2013  . Essential hypertension, benign 12/27/2013  . Fibromyalgia 12/27/2013  . Muscle spasm 12/27/2013  . GERD (gastroesophageal reflux disease) 12/27/2013  . Leukocytosis 12/27/2013  . Hyperlipidemia 01/03/2014  . Other migraine without status migrainosus, not intractable 03/07/2015  . 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 History of cholecystectomy 09/11/2010  . Generalized anxiety disorder 09/10/2015  . Hypokalemia 09/27/2015  . 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 Surgical menopause 04/23/2016  . Cervical spondylosis without myelopathy 04/23/2016  . Nipple discharge in female 08/03/2016  . Anorectal skin tags 08/03/2016  . Bipolar 2 disorder, major depressive episode ([place]) 11/13/2016  . Frequent headaches 11/15/2016  . Family history of  brain aneurysm 11/15/2016  . Moderate persistent asthma without complication 123456  . Functional diarrhea 12/03/2016  . Fatigue 02/13/2018  . Inattention 02/13/2018  . Hot flashes 02/26/2018  . Panic attacks 03/30/2018  . Insomnia due to other mental disorder 03/30/2018   Resolved Ambulatory Problems    Diagnosis Date Noted  . Asthma, chronic 12/27/2013  . Perforation of left tympanic membrane 12/27/2013  . Bipolar I disorder, most recent episode depressed ([place]) 10/11/2014  . Ovarian cyst, right 07/15/2015  . Amenorrhea 07/15/2015  . Abnormal uterine bleeding (AUB) 09/10/2015  . Post-operative state 09/10/2015  . Acute tonsillitis 04/23/2016  . Loose stools 08/03/2016  . No energy 11/13/2016  . Acute non-recurrent maxillary sinusitis 12/03/2016   Past Medical History:  Diagnosis Date  . Anxiety   . Bipolar 2 disorder ([place])   . Headache   . Low iron   . PCOS (polycystic ovarian syndrome)   . Poor dentition   . Restless leg syndrome   . Right leg pain   . Teeth clenching   . Teeth grinding   . Weakness       Review of Systems See HPI.     Objective:   Physical Exam Vitals signs reviewed.  Constitutional:      General: She is not in acute distress.    Appearance: Normal appearance. She is obese.  HENT:     Head: Normocephalic.     Right Ear: Tympanic membrane, ear canal and external ear normal.     Left Ear: Tympanic membrane, ear canal and external  ear normal.     Nose: Congestion present.     Mouth/Throat:     Mouth: Mucous membranes are moist.     Pharynx: No oropharyngeal exudate.  Eyes:     General:        Right eye: No discharge.        Left eye: No discharge.     Extraocular Movements: Extraocular movements intact.     Conjunctiva/sclera: Conjunctivae normal.     Pupils: Pupils are equal, round, and reactive to light.  Cardiovascular:     Rate and Rhythm: Normal rate and regular rhythm.     Pulses: Normal pulses.  Pulmonary:     Effort:  Pulmonary effort is normal.     Breath sounds: Wheezing and rhonchi present.  Musculoskeletal:     Right lower leg: No edema.     Left lower leg: No edema.     Comments: No calf pain or tenderness to palpation .  Skin:    Coloration: Skin is pale.  Neurological:     General: No focal deficit present.     Mental Status: She is alert and oriented to person, place, and time.  Psychiatric:        Mood and Affect: Mood normal.           Assessment & Plan:  Marland KitchenMarland KitchenJette was seen today for asthma and hypertension.  Diagnoses and all orders for this visit:  Moderate persistent asthma with exacerbation -     predniSONE (DELTASONE) 20 MG tablet; Take 3 tablets for 3 days, take 2 tablets for 3 days, take 1 tablets for 3 days, take 1/2 tablet for 4 days. -     azithromycin (ZITHROMAX) 250 MG tablet; Take 2 tablets now and then one tablet for 4 days.  Sinobronchitis -     azithromycin (ZITHROMAX) 250 MG tablet; Take 2 tablets now and then one tablet for 4 days.   Pt continues to have significant wheezing on exam. Her cough has also become more productive. Adding zpak with longer prednisone taper. Make sure taking singulair daily. Albuterol as needed. If noticing wheezing resolves and then starts to come back may need to consider daily ICS in the future. Rest and hydrate. Low suspicion for DVT but discussed red flag warnings and symptoms. Certainly if has any unilateral leg swelling call office for venous doppler. If SOB not improving will get CTA due to estrogen therapy.

## 2019-04-30 ENCOUNTER — Encounter: Payer: Self-pay | Admitting: Physician Assistant

## 2019-06-08 ENCOUNTER — Other Ambulatory Visit: Payer: Self-pay | Admitting: Physician Assistant

## 2019-06-08 ENCOUNTER — Other Ambulatory Visit: Payer: Self-pay | Admitting: Family Medicine

## 2019-06-08 DIAGNOSIS — I10 Essential (primary) hypertension: Secondary | ICD-10-CM

## 2019-07-16 ENCOUNTER — Encounter: Payer: Self-pay | Admitting: Physician Assistant

## 2019-07-17 MED ORDER — ALBUTEROL SULFATE HFA 108 (90 BASE) MCG/ACT IN AERS: 2.0000 | INHALATION_SPRAY | RESPIRATORY_TRACT | 4 refills | Status: DC | PRN

## 2019-07-17 NOTE — Telephone Encounter (Signed)
Sent RX, want to make you aware since her last visit was for asthma

## 2019-08-18 ENCOUNTER — Other Ambulatory Visit: Payer: Self-pay | Admitting: Physician Assistant

## 2019-08-18 DIAGNOSIS — I10 Essential (primary) hypertension: Secondary | ICD-10-CM

## 2019-08-25 ENCOUNTER — Other Ambulatory Visit: Payer: Self-pay

## 2019-08-25 ENCOUNTER — Ambulatory Visit (INDEPENDENT_AMBULATORY_CARE_PROVIDER_SITE_OTHER): Payer: Self-pay | Admitting: Physician Assistant

## 2019-08-25 VITALS — BP 133/74 | HR 75 | Ht 68.0 in | Wt 294.0 lb

## 2019-08-25 DIAGNOSIS — R635 Abnormal weight gain: Secondary | ICD-10-CM

## 2019-08-25 DIAGNOSIS — E8941 Symptomatic postprocedural ovarian failure: Secondary | ICD-10-CM

## 2019-08-25 DIAGNOSIS — F4321 Adjustment disorder with depressed mood: Secondary | ICD-10-CM

## 2019-08-25 DIAGNOSIS — N951 Menopausal and female climacteric states: Secondary | ICD-10-CM

## 2019-08-25 NOTE — Progress Notes (Signed)
Subjective:    Patient ID: Margaret Lawson, female    DOB: Jan 07, 1981, 39 y.o.   MRN: [id number]  HPI  Pt is a 39 yo female HTN, asthma, bipolar 2, anxiety who presents to the clinic after death of her father with worsening mood.   She made appt but since her [facility] clinic did reach out to her and add prozac and increased risperdal. She is feeling a little better. She is sleeping better. She is talking with sister. No formal counseling. No SI/HC.   She hates her weight gain and hot flashes. She reports taking estrace and gabapentin.   .. Active Ambulatory Problems    Diagnosis Date Noted  . History of nephrolithiasis 12/27/2013  . Essential hypertension, benign 12/27/2013  . Fibromyalgia 12/27/2013  . Muscle spasm 12/27/2013  . GERD (gastroesophageal reflux disease) 12/27/2013  . Leukocytosis 12/27/2013  . Hyperlipidemia 01/03/2014  . Other migraine without status migrainosus, not intractable 03/07/2015  . 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 History of cholecystectomy 09/11/2010  . Generalized anxiety disorder 09/10/2015  . Hypokalemia 09/27/2015  . 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 Surgical menopause 04/23/2016  . Cervical spondylosis without myelopathy 04/23/2016  . Nipple discharge in female 08/03/2016  . Anorectal skin tags 08/03/2016  . Bipolar 2 disorder, major depressive episode ([place]) 11/13/2016  . Frequent headaches 11/15/2016  . Family history of brain aneurysm 11/15/2016  . Moderate persistent asthma without complication 123456  . Functional diarrhea 12/03/2016  . Fatigue 02/13/2018  . Inattention 02/13/2018  . Hot flashes 02/26/2018  . Panic attacks 03/30/2018  . Insomnia due to other mental disorder 03/30/2018  . Grief reaction 08/28/2019  . Weight gain 08/28/2019  . Hot flashes due to surgical menopause 08/28/2019  . Female climacteric state 08/28/2019   Resolved Ambulatory Problems    Diagnosis Date Noted  . Asthma,  chronic 12/27/2013  . Perforation of left tympanic membrane 12/27/2013  . Bipolar I disorder, most recent episode depressed ([place]) 10/11/2014  . Ovarian cyst, right 07/15/2015  . Amenorrhea 07/15/2015  . Abnormal uterine bleeding (AUB) 09/10/2015  . Post-operative state 09/10/2015  . Acute tonsillitis 04/23/2016  . Loose stools 08/03/2016  . No energy 11/13/2016  . Acute non-recurrent maxillary sinusitis 12/03/2016   Past Medical History:  Diagnosis Date  . Anxiety   . Bipolar 2 disorder ([place])   . Headache   . Low iron   . PCOS (polycystic ovarian syndrome)   . Poor dentition   . Restless leg syndrome   . Right leg pain   . Teeth clenching   . Teeth grinding   . Weakness         Review of Systems See HPI.     Objective:   Physical Exam Vitals reviewed.  Constitutional:      Appearance: Normal appearance. She is obese.  Cardiovascular:     Rate and Rhythm: Normal rate.  Pulmonary:     Effort: Pulmonary effort is normal.  Neurological:     General: No focal deficit present.     Mental Status: She is alert and oriented to person, place, and time.  Psychiatric:        Mood and Affect: Mood normal.      .. Depression screen [facility] 2/9 08/25/2019 04/26/2019 02/23/2018 02/07/2018 11/13/2016  Decreased Interest 2 1 2 3 2   Down, Depressed, Hopeless 2 1 2 3 2   PHQ - 2 Score 4 2 4 6 4   Altered sleeping 2 1  3 3 1   Tired, decreased energy 2 1 3 3 3   Change in appetite 1 0 2 3 3   Feeling bad or failure about yourself  1 1 3 3 2   Trouble concentrating 2 0 2 3 3   Moving slowly or fidgety/restless 1 0 2 3 3   Suicidal thoughts 0 0 1 - 0  PHQ-9 Score 13 5 20 24 19   Difficult doing work/chores Somewhat difficult Somewhat difficult Extremely dIfficult Extremely dIfficult -   .. GAD 7 : Generalized Anxiety Score 08/25/2019 04/26/2019 02/23/2018 02/07/2018  Nervous, Anxious, on Edge 2 3 3 3   Control/stop worrying 2 2 3 3   Worry too much - different things 2 1 3 3   Trouble relaxing  2 2 3 3   Restless 1 1 3 3   Easily annoyed or irritable 1 0 2 3  Afraid - awful might happen 1 0 3 2  Total GAD 7 Score 11 9 20 20   Anxiety Difficulty Very difficult Somewhat difficult Extremely difficult Extremely difficult         Assessment & Plan:  Marland KitchenMarland KitchenMakaelynn was seen today for night sweats.  Diagnoses and all orders for this visit:  Grief reaction  Hot flashes due to surgical menopause -     TSH -     COMPLETE METABOLIC PANEL WITH GFR -     Estrogens, total -     Estradiol -     Progesterone  Weight gain  Female climacteric state   Pt feels better today after reaching out to psychiatrist. Changed medication and added prozac and increased Risperdal. She has follow up scheduled. Any acute changes can always reach out. Encouraged daily meditation/exercise/walk.   Will check hormones being on estrace. Consider making referral to a female clinic to better prescribe HRT. Pt is aware of risk. Increased gabapentin at bedtime to see if would help with hot flashes. Consider working with [facility] to stop prozac and consider Effexor. Consider accupunture.   She is on a lot of medications to cause weight gain. Encouraged to watch calories and try to walk at least 20 minutes a day.

## 2019-08-25 NOTE — Patient Instructions (Signed)
Increase gabapentin to 800mg  at bedtime same dose during the day.

## 2019-08-28 ENCOUNTER — Encounter: Payer: Self-pay | Admitting: Physician Assistant

## 2019-08-28 DIAGNOSIS — R635 Abnormal weight gain: Secondary | ICD-10-CM | POA: Insufficient documentation

## 2019-08-28 DIAGNOSIS — F4321 Adjustment disorder with depressed mood: Secondary | ICD-10-CM | POA: Insufficient documentation

## 2019-08-28 DIAGNOSIS — N951 Menopausal and female climacteric states: Secondary | ICD-10-CM | POA: Insufficient documentation

## 2019-08-28 DIAGNOSIS — E8941 Symptomatic postprocedural ovarian failure: Secondary | ICD-10-CM | POA: Insufficient documentation

## 2019-08-29 ENCOUNTER — Encounter: Payer: Self-pay | Admitting: Physician Assistant

## 2019-09-04 ENCOUNTER — Other Ambulatory Visit: Payer: Self-pay | Admitting: Family Medicine

## 2019-11-03 ENCOUNTER — Telehealth (INDEPENDENT_AMBULATORY_CARE_PROVIDER_SITE_OTHER): Payer: Self-pay | Admitting: Nurse Practitioner

## 2019-11-03 ENCOUNTER — Encounter: Payer: Self-pay | Admitting: Nurse Practitioner

## 2019-11-03 ENCOUNTER — Encounter: Payer: Self-pay | Admitting: Physician Assistant

## 2019-11-03 DIAGNOSIS — R05 Cough: Secondary | ICD-10-CM

## 2019-11-03 DIAGNOSIS — R059 Cough, unspecified: Secondary | ICD-10-CM

## 2019-11-03 DIAGNOSIS — J4541 Moderate persistent asthma with (acute) exacerbation: Secondary | ICD-10-CM

## 2019-11-03 MED ORDER — IPRATROPIUM-ALBUTEROL 0.5-2.5 (3) MG/3ML IN SOLN: 3.0000 mL | RESPIRATORY_TRACT | 3 refills | Status: AC | PRN

## 2019-11-03 MED ORDER — FLUCONAZOLE 150 MG PO TABS: 150.0000 mg | ORAL_TABLET | Freq: Once | ORAL | 0 refills | Status: AC

## 2019-11-03 MED ORDER — HYDROCODONE-HOMATROPINE 5-1.5 MG/5ML PO SYRP: 5.0000 mL | ORAL_SOLUTION | Freq: Three times a day (TID) | ORAL | 0 refills | Status: DC | PRN

## 2019-11-03 MED ORDER — AZITHROMYCIN 250 MG PO TABS: ORAL_TABLET | ORAL | 0 refills | Status: DC

## 2019-11-03 MED ORDER — MONTELUKAST SODIUM 10 MG PO TABS: 10.0000 mg | ORAL_TABLET | Freq: Every day | ORAL | 3 refills | Status: DC

## 2019-11-03 MED ORDER — PREDNISONE 20 MG PO TABS: ORAL_TABLET | ORAL | 0 refills | Status: DC

## 2019-11-03 NOTE — Progress Notes (Signed)
Virtual Visit via MyChart Note  I connected with  Margaret Lawson on 11/03/19 at  4:10 PM EDT by the video enabled telemedicine application, MyChart, and verified that I am speaking with the correct person using two identifiers.   I introduced myself as a Designer, jewellery with the practice. We discussed the limitations of evaluation and management by telemedicine and the availability of in person appointments. The patient expressed understanding and agreed to proceed.  The patient is: At home I am: In the office  Subjective:    CC: Asthma exacerbation  HPI: Margaret Lawson is a 39 y.o. y/o female presenting via [place] today for an acute asthma exacerbation that started on Wednesday when she and her husband went out for a drive.  She reports that she and her husband went out only for a few hours however the pollen count was so high triggered an asthma exacerbation that has now progressed with significant upper respiratory symptoms. Endorses productive cough with thick yellow mucus, sinus pain and pressure, congestion, sore throat, fatigue, back pain, and fevers over 100 degrees.  Been utilizing her DuoNeb approximately every 3 hours and has not been able to sleep due to shortness of breath and cough.  She is monitoring her oxygen saturations which are between 89 and 92.  She has been taking Tylenol for fever and pain.  She has had this reaction multiple times in the past and is concerned that the exacerbation is turning into pneumonia specifically due to the pain in the right lower lobe area towards her back.  Nothing seems to make the symptoms better and nothing is necessarily making them worse.  Past medical history, Surgical history, Family history not pertinant except as noted below, Social history, Allergies, and medications have been entered into the medical record, reviewed, and corrections made.   Review of Systems:  See HPI for pertinent positive negatives  Objective:     General: Speaking clearly in complete sentences without any shortness of breath.  Alert and oriented x3.  Normal judgment. No apparent acute distress. Patient is audibly congested with a frequent productive cough.  Impression and Recommendations:    1. Moderate persistent asthma with acute exacerbation Symptoms and presentation consistent with moderate persistent asthma exacerbation.  Unfortunately the patient has had multiple encounters with this type of active exacerbation in the past and is well versed with symptoms and management.  Due to increased risk for infection will start antibiotic therapy prophylactically. Prescription for prednisone taper, azithromycin, montelukast, and Hycodan provided.  Refills provided for DuoNeb.  Diflucan provided for yeast infection that is typical for the patient with antibiotic use. Patient does have allergy listed for erythromycin but has tolerated azithromycin well in the past.  Patient also has allergy listed for morphine and related however this was only with large doses of intravenous morphine in the hospital setting.  She reports she tolerates Hycodan cough syrup well. Discussed emergency symptoms for patient to monitor for that would require emergency evaluation.  The patient does have a pulse oximeter and understands how to utilize this device effectively. Follow-up if symptoms worsen or fail to improve. - predniSONE (DELTASONE) 20 MG tablet; Three tabs daily days 1-3, two tabs daily days 4-6, one tab daily days 7-9, half tab daily days 10-13.  Dispense: 20 tablet; Refill: 0 - azithromycin (ZITHROMAX Z-PAK) 250 MG tablet; Take 2 tablets (500 mg) on  Day 1,  followed by 1 tablet (250 mg) once daily on Days 2 through 5.  Dispense: 6  tablet; Refill: 0 - fluconazole (DIFLUCAN) 150 MG tablet; Take 1 tablet (150 mg total) by mouth once for 1 dose.  Dispense: 1 tablet; Refill: 0 - ipratropium-albuterol (DUONEB) 0.5-2.5 (3) MG/3ML SOLN; Take 3 mLs by  nebulization every 2 (two) hours as needed (wheeze, SOB).  Dispense: 60 mL; Refill: 3  2. Cough Significant persistent cough interfering with rest in the setting of asthma exacerbation. Patient provided with prescription for Hycodan cough syrup to help with cough and rest.  Patient instructed not to drive or operate machinery while using the medication. - HYDROcodone-homatropine (HYCODAN) 5-1.5 MG/5ML syrup; Take 5 mLs by mouth every 8 (eight) hours as needed for cough.  Dispense: 120 mL; Refill: 0    I discussed the assessment and treatment plan with the patient. The patient was provided an opportunity to ask questions and all were answered. The patient agreed with the plan and demonstrated an understanding of the instructions.   The patient was advised to call back or seek an in-person evaluation if the symptoms worsen or if the condition fails to improve as anticipated.  I provided 15 minutes of non-face-to-face interaction with this [place] visit.   Orma Render, NP

## 2019-11-03 NOTE — Telephone Encounter (Signed)
FYI

## 2019-11-12 ENCOUNTER — Encounter: Payer: Self-pay | Admitting: Physician Assistant

## 2019-11-13 ENCOUNTER — Other Ambulatory Visit: Payer: Self-pay | Admitting: Nurse Practitioner

## 2019-11-13 ENCOUNTER — Other Ambulatory Visit: Payer: Self-pay | Admitting: Physician Assistant

## 2019-11-13 DIAGNOSIS — I10 Essential (primary) hypertension: Secondary | ICD-10-CM

## 2019-11-13 DIAGNOSIS — J4541 Moderate persistent asthma with (acute) exacerbation: Secondary | ICD-10-CM

## 2019-11-13 MED ORDER — PREDNISONE 20 MG PO TABS: ORAL_TABLET | ORAL | 0 refills | Status: AC

## 2019-11-22 ENCOUNTER — Encounter: Payer: Self-pay | Admitting: Nurse Practitioner

## 2019-11-22 ENCOUNTER — Other Ambulatory Visit: Payer: Self-pay | Admitting: Physician Assistant

## 2019-11-22 ENCOUNTER — Other Ambulatory Visit: Payer: Self-pay | Admitting: Nurse Practitioner

## 2019-11-22 DIAGNOSIS — B370 Candidal stomatitis: Secondary | ICD-10-CM

## 2019-11-22 MED ORDER — FLUCONAZOLE 100 MG PO TABS: ORAL_TABLET | ORAL | 0 refills | Status: DC

## 2019-12-15 ENCOUNTER — Other Ambulatory Visit: Payer: Self-pay | Admitting: Physician Assistant

## 2019-12-15 DIAGNOSIS — I10 Essential (primary) hypertension: Secondary | ICD-10-CM

## 2020-01-03 ENCOUNTER — Ambulatory Visit (INDEPENDENT_AMBULATORY_CARE_PROVIDER_SITE_OTHER): Payer: Self-pay | Admitting: Physician Assistant

## 2020-01-03 ENCOUNTER — Encounter: Payer: Self-pay | Admitting: Physician Assistant

## 2020-01-03 VITALS — BP 133/64 | HR 78 | Ht 68.0 in | Wt 316.0 lb

## 2020-01-03 DIAGNOSIS — M797 Fibromyalgia: Secondary | ICD-10-CM

## 2020-01-03 DIAGNOSIS — E876 Hypokalemia: Secondary | ICD-10-CM

## 2020-01-03 DIAGNOSIS — I10 Essential (primary) hypertension: Secondary | ICD-10-CM

## 2020-01-03 DIAGNOSIS — Z131 Encounter for screening for diabetes mellitus: Secondary | ICD-10-CM

## 2020-01-03 DIAGNOSIS — Z13220 Encounter for screening for lipoid disorders: Secondary | ICD-10-CM

## 2020-01-03 DIAGNOSIS — F3181 Bipolar II disorder: Secondary | ICD-10-CM

## 2020-01-03 MED ORDER — ATENOLOL 25 MG PO TABS: 25.0000 mg | ORAL_TABLET | Freq: Every day | ORAL | 3 refills | Status: DC

## 2020-01-03 MED ORDER — TRIAMTERENE-HCTZ 37.5-25 MG PO TABS: 1.0000 | ORAL_TABLET | Freq: Every day | ORAL | 3 refills | Status: DC

## 2020-01-03 MED ORDER — POTASSIUM CHLORIDE CRYS ER 10 MEQ PO TBCR: 10.0000 meq | EXTENDED_RELEASE_TABLET | Freq: Two times a day (BID) | ORAL | 3 refills | Status: DC

## 2020-01-03 MED ORDER — METHOCARBAMOL 500 MG PO TABS: 500.0000 mg | ORAL_TABLET | Freq: Three times a day (TID) | ORAL | 2 refills | Status: DC

## 2020-01-03 NOTE — Progress Notes (Signed)
Subjective:    Patient ID: Margaret Lawson, female    DOB: 12/08/1980, 39 y.o.   MRN: [id number]  HPI  Patient is a 39 year old female with bipolar, fibromyalgia, asthma, hypertension who presents to the clinic for medication follow-up.  Patient has been running low on her atenolol for a few months.  She has not been taking the complete dose.  She has ran out of Maxide.  She has not been able to get in to get labs drawn.  She denies any chest pain, shortness of breath, headache, vision changes.  Her blood pressures have actually been looking good.  She keeps Lasix for intermittent bilateral lower extremity edema.  She does have to take potassium intermittently with Lasix.  Bipolar is controlled by behavioral health.  She feels stable but still has her good and bad days.  Overall her fibromyalgia pain is what keeps her down the most.  Her fibromyalgia pain is worsening.  Gabapentin is only helping minimally.  She has a lot of mid to low back pain and tightness.  She is unable to work right now.  Patient has no known back injuries.  There is no pain that radiates down into her legs.  There is no numbness or tingling.  She is moving in with her in-laws soon and hopes this will take some of the stress off of her and allow her some time to get better  Asthma doing well. No recent exacerbations.   .. 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/10/2015   Surgical  menopause 04/23/2016   Cervical spondylosis without myelopathy 04/23/2016   Nipple discharge in female 08/03/2016   Anorectal skin tags 08/03/2016   Bipolar 2 disorder, major depressive episode ([place]) 11/13/2016   Frequent headaches 11/15/2016   Family history of brain aneurysm 11/15/2016   Moderate persistent asthma without complication 67/89/3810   Functional diarrhea 12/03/2016   Fatigue 02/13/2018   Inattention 02/13/2018   Hot flashes 02/26/2018   Panic attacks 03/30/2018   Insomnia due to other mental disorder 03/30/2018   Grief reaction 08/28/2019   Weight gain 08/28/2019   Hot flashes due to surgical menopause 08/28/2019   Female climacteric state 08/28/2019   Resolved Ambulatory Problems    Diagnosis Date Noted   Asthma, chronic 12/27/2013   Perforation of left tympanic membrane 12/27/2013   Bipolar I disorder, most recent episode depressed ([place]) 10/11/2014   Ovarian cyst, right 07/15/2015   Amenorrhea 07/15/2015   Abnormal uterine bleeding (AUB) 09/10/2015   Post-operative state 09/10/2015   Acute tonsillitis 04/23/2016   Loose stools 08/03/2016   No energy 11/13/2016   Acute non-recurrent maxillary sinusitis 12/03/2016   Past Medical History:  Diagnosis Date   Anxiety    Bipolar 2 disorder (HCC)    Headache    Low iron    PCOS (polycystic ovarian syndrome)    Poor dentition    Restless leg syndrome    Right leg pain    Teeth clenching    Teeth grinding    Weakness  Review of Systems See HPI.     Objective:   Physical Exam Vitals reviewed.  Constitutional:      Appearance: Normal appearance. She is obese.  Cardiovascular:     Rate and Rhythm: Normal rate and regular rhythm.     Pulses: Normal pulses.     Heart sounds: Normal heart sounds.  Pulmonary:     Effort: Pulmonary effort is normal.     Breath sounds: Normal breath sounds.  Musculoskeletal:     Comments: 18/18 tender trigger points for  fibromyalgia.   Neurological:     General: No focal deficit present.     Mental Status: She is alert and oriented to person, place, and time.  Psychiatric:        Mood and Affect: Mood normal.           Assessment & Plan:  Marland KitchenMarland KitchenEmberlyn was seen today for hypertension.  Diagnoses and all orders for this visit:  Essential hypertension, benign -     triamterene-hydrochlorothiazide (MAXZIDE-25) 37.5-25 MG tablet; Take 1 tablet by mouth daily. -     atenolol (TENORMIN) 25 MG tablet; Take 1 tablet (25 mg total) by mouth daily. -     COMPLETE METABOLIC PANEL WITH GFR  Bipolar 2 disorder, major depressive episode (HCC)  Fibromyalgia -     methocarbamol (ROBAXIN) 500 MG tablet; Take 1 tablet (500 mg total) by mouth 3 (three) times daily.  Screening for lipid disorders -     Lipid Panel w/reflex Direct LDL  Screening for diabetes mellitus -     COMPLETE METABOLIC PANEL WITH GFR  Hypokalemia -     potassium chloride (KLOR-CON) 10 MEQ tablet; Take 1 tablet (10 mEq total) by mouth 2 (two) times daily.     BP is great with lower dose of atenolol and not taking maxide. Will cut atenolol down to 25mg  daily( she wants to stay on it due to helping with migraines) and maxide. Continue to use potassium as needed with lasix for swelling.  CMP ordered.   [facility] with [facility] for Bipolar and medications.   Fibromyaglia-discussed anti-inflammatory diet.  Encourage regular walking.  Encourage acupuncture and other ways to help with myalgias.  Encourage proper hydration.  She could use some CBD oils.  I did give her muscle relaxer to try.  Discussed other conservative symptomatic ways to reduce fibromyalgia pain.continue on gabapentin.  Moderate dysfunction on FIQ.   Follow up in 6 months.

## 2020-01-04 ENCOUNTER — Encounter: Payer: Self-pay | Admitting: Physician Assistant

## 2020-01-05 LAB — COMPLETE METABOLIC PANEL WITH GFR
AG Ratio: 1.5 (calc) (ref 1.0–2.5)
ALT: 19 U/L (ref 6–29)
AST: 19 U/L (ref 10–30)
Albumin: 3.7 g/dL (ref 3.6–5.1)
Alkaline phosphatase (APISO): 61 U/L (ref 31–125)
BUN: 11 mg/dL (ref 7–25)
CO2: 27 mmol/L (ref 20–32)
Calcium: 9.1 mg/dL (ref 8.6–10.2)
Chloride: 104 mmol/L (ref 98–110)
Creat: 0.85 mg/dL (ref 0.50–1.10)
GFR, Est African American: 100 mL/min/{1.73_m2} (ref >=60)
GFR, Est Non African American: 86 mL/min/{1.73_m2} (ref >=60)
Globulin: 2.5 g/dL (calc) (ref 1.9–3.7)
Glucose, Bld: 108 mg/dL — ABNORMAL HIGH (ref 65–99)
Potassium: 4 mmol/L (ref 3.5–5.3)
Sodium: 139 mmol/L (ref 135–146)
Total Bilirubin: 0.5 mg/dL (ref 0.2–1.2)
Total Protein: 6.2 g/dL (ref 6.1–8.1)

## 2020-01-08 NOTE — Progress Notes (Signed)
Babetta,   Kidney and liver look great.

## 2020-01-10 ENCOUNTER — Encounter (HOSPITAL_COMMUNITY): Payer: Self-pay | Admitting: Psychiatric/Mental Health

## 2020-01-10 ENCOUNTER — Other Ambulatory Visit: Payer: Self-pay

## 2020-01-10 ENCOUNTER — Telehealth (INDEPENDENT_AMBULATORY_CARE_PROVIDER_SITE_OTHER): Payer: No Payment, Other | Admitting: Psychiatric/Mental Health

## 2020-01-10 DIAGNOSIS — G4700 Insomnia, unspecified: Secondary | ICD-10-CM

## 2020-01-10 DIAGNOSIS — F603 Borderline personality disorder: Secondary | ICD-10-CM

## 2020-01-10 DIAGNOSIS — F410 Panic disorder [episodic paroxysmal anxiety] without agoraphobia: Secondary | ICD-10-CM

## 2020-01-10 DIAGNOSIS — F4310 Post-traumatic stress disorder, unspecified: Secondary | ICD-10-CM | POA: Diagnosis not present

## 2020-01-10 DIAGNOSIS — F315 Bipolar disorder, current episode depressed, severe, with psychotic features: Secondary | ICD-10-CM | POA: Diagnosis not present

## 2020-01-10 DIAGNOSIS — F319 Bipolar disorder, unspecified: Secondary | ICD-10-CM | POA: Insufficient documentation

## 2020-01-10 MED ORDER — MIRTAZAPINE 15 MG PO TABS: 15.0000 mg | ORAL_TABLET | Freq: Every day | ORAL | 2 refills | Status: DC

## 2020-01-10 MED ORDER — GABAPENTIN 400 MG PO CAPS: 400.0000 mg | ORAL_CAPSULE | Freq: Three times a day (TID) | ORAL | 2 refills | Status: DC

## 2020-01-10 MED ORDER — TRAZODONE HCL 50 MG PO TABS: 50.0000 mg | ORAL_TABLET | Freq: Three times a day (TID) | ORAL | 2 refills | Status: DC

## 2020-01-10 MED ORDER — BUSPIRONE HCL 10 MG PO TABS: 10.0000 mg | ORAL_TABLET | Freq: Three times a day (TID) | ORAL | 2 refills | Status: AC

## 2020-01-10 MED ORDER — RISPERIDONE 1 MG PO TABS: 2.0000 mg | ORAL_TABLET | Freq: Every day | ORAL | 2 refills | Status: DC

## 2020-01-10 MED ORDER — PRAZOSIN HCL 1 MG PO CAPS: 1.0000 mg | ORAL_CAPSULE | Freq: Every day | ORAL | 2 refills | Status: DC

## 2020-01-10 MED ORDER — FLUOXETINE HCL 10 MG PO TABS: 10.0000 mg | ORAL_TABLET | Freq: Every day | ORAL | 2 refills | Status: DC

## 2020-01-10 NOTE — Progress Notes (Signed)
Psychiatric Initial Adult Assessment  Virtual Visit via Video Note   I connected with Margaret Lawson on 01/10/20 by a video enabled telemedicine application and verified that I am speaking with the correct person using two identifiers.   Location: Patient: Home Provider: [place] home office   I discussed the limitations of evaluation and management by telemedicine and the availability of in person appointments. The patient expressed understanding and agreed to proceed.   I provided 60 minutes of non-face-to-face time during this encounter.   Patient Identification: Margaret Lawson MRN:  [id number] Date of Evaluation:  01/10/2020 Referral Source: [facility] Chief Complaint:  "im nervous, I have a lot of things going on" Visit Diagnosis:    ICD-10-CM   1. Insomnia, unspecified type  G47.00 traZODone (DESYREL) 50 MG tablet  2. PTSD (post-traumatic stress disorder)  F43.10 mirtazapine (REMERON) 15 MG tablet  3. Bipolar disorder, current episode depressed, severe, with psychotic features ([place])  F31.5 busPIRone (BUSPAR) 10 MG tablet    risperiDONE (RISPERDAL) 1 MG tablet  4. Borderline personality disorder ([place])  F60.3 FLUoxetine (PROZAC) 10 MG tablet  5. Panic attacks  F41.0 gabapentin (NEURONTIN) 400 MG capsule    prazosin (MINIPRESS) 1 MG capsule    History of Present Illness:  Margaret Lawson is a 39 year old female seen today for initial psych evaluation.  Patient was referred to outpatient psychiatry by [facility] for medication management.  On assessment Eliana reports that she is nervous about thing going on in her life.  She says that besides that, the medications are working, however her nightmares still causes her stress and anxiety. Her  Sleep is off and on due to the frequency of her nightmares.  She reports stable mood and sees no need for med adjustments.  Patient will follow-up in 12 weeks and will continue medications as prescribed and requested refills.  Provider refilled medications.   No other concerns at this time.  Associated Signs/Symptoms: Depression Symptoms:  anxiety, panic attacks, (Hypo) Manic Symptoms:  Distractibility, Anxiety Symptoms:  Social Anxiety, Psychotic Symptoms:  na PTSD Symptoms: NA  Past Psychiatric History: MDD, PTSD,BPD,panic attacks  Previous Psychotropic Medications: No   Substance Abuse History in the last 12 months:  No.  Consequences of Substance Abuse: NA  Past Medical History:  Past Medical History:  Diagnosis Date  . Anxiety    severe  . Asthma, chronic 12/27/2013  . Bipolar 2 disorder ([place])   . Essential hypertension, benign 12/27/2013  . Fibromyalgia 12/27/2013  . GERD (gastroesophageal reflux disease)   . Headache    migraines, over stimulation  . Leukocytosis 12/27/2013  . Low iron   . PCOS (polycystic ovarian syndrome)   . Perforation of left tympanic membrane 12/27/2013  . Poor dentition    right side upper and lower  . Restless leg syndrome   . Right leg pain   . Teeth clenching   . Teeth grinding   . Weakness    when exposed to hot or cold temperatures    Past Surgical History:  Procedure Laterality Date  . CHOLECYSTECTOMY    . ROBOTIC ASSISTED TOTAL HYSTERECTOMY WITH BILATERAL SALPINGO OOPHERECTOMY Bilateral 09/10/2015   Procedure: ROBOTIC ASSISTED TOTAL HYSTERECTOMY WITH BILATERAL SALPINGECTOMY;  Surgeon: Lavonia Drafts, MD;  Location: [facility];  Service: Gynecology;  Laterality: Bilateral;  . WISDOM TOOTH EXTRACTION      Family Psychiatric History: unknown  Family History:  Family History  Problem Relation Age of Onset  . Aneurysm Mother  brain  . Depression Father   . Heart disease Father   . Mitral valve prolapse Father   . Diabetes Father   . Hypertension Father   . Depression Sister   . Breast cancer Paternal Grandfather   . Breast cancer Paternal Aunt     Social History:   Social History   Socioeconomic History  . Marital status: Married    Spouse name: Not on file  .  Number of children: Not on file  . Years of education: Not on file  . Highest education level: Not on file  Occupational History  . Occupation: unemployed  Tobacco Use  . Smoking status: Current Every Day Smoker    Packs/day: 1.00    Years: 20.00    Pack years: 20.00    Types: Cigarettes  . Smokeless tobacco: Never Used  . Tobacco comment: uses 6mg  vapes  Substance and Sexual Activity  . Alcohol use: No    Alcohol/week: 0.0 standard drinks  . Drug use: No  . Sexual activity: Yes    Partners: Male    Birth control/protection: None  Other Topics Concern  . Not on file  Social History Narrative  . Not on file   Social Determinants of Health   Financial Resource Strain:   . Difficulty of Paying Living Expenses:   Food Insecurity:   . Worried About Charity fundraiser in the Last Year:   . Arboriculturist in the Last Year:   Transportation Needs:   . Film/video editor (Medical):   Marland Kitchen Lack of Transportation (Non-Medical):   Physical Activity:   . Days of Exercise per Week:   . Minutes of Exercise per Session:   Stress:   . Feeling of Stress :   Social Connections:   . Frequency of Communication with Friends and Family:   . Frequency of Social Gatherings with Friends and Family:   . Attends Religious Services:   . Active Member of Clubs or Organizations:   . Attends Archivist Meetings:   Marland Kitchen Marital Status:     Additional Social History: unknown  Allergies:   Allergies  Allergen Reactions  . Adhesive [Tape] Other (See Comments)    Blisters, paper tape is worse  . Ambien [Zolpidem Tartrate]     Insomnia.  . Celecoxib Nausea And Vomiting  . Doxycycline     Double vision   . Erythromycin Nausea And Vomiting  . Lyrica [Pregabalin]     Induced lactation  . Morphine And Related     Decrease bp  . Penicillins Hives    Has patient had a PCN reaction causing immediate rash, facial/tongue/throat swelling, SOB or lightheadedness with hypotension: Yes Has  patient had a PCN reaction causing severe rash involving mucus membranes or skin necrosis: No Has patient had a PCN reaction that required hospitalization No Has patient had a PCN reaction occurring within the last 10 years: Yes If all of the above answers are "NO", then may proceed with Cephalosporin use.   . Latex Rash    Metabolic Disorder Labs: Lab Results  Component Value Date   HGBA1C 5.8 (H) 06/25/2015   MPG 120 (H) 06/25/2015   No results found for: PROLACTIN Lab Results  Component Value Date   CHOL 213 (H) 02/27/2015   TRIG 210 (H) 02/27/2015   HDL 34 (L) 02/27/2015   CHOLHDL 6.3 (H) 02/27/2015   VLDL 42 (H) 02/27/2015   LDLCALC 137 (H) 02/27/2015   LDLCALC 116 (H)  01/02/2014   Lab Results  Component Value Date   TSH 1.29 11/13/2016    Therapeutic Level Labs: No results found for: LITHIUM No results found for: CBMZ No results found for: VALPROATE  Current Medications: Current Outpatient Medications  Medication Sig Dispense Refill  . acetaminophen (TYLENOL) 500 MG tablet Take 500 mg by mouth every 6 (six) hours as needed for mild pain or moderate pain.    Marland Kitchen albuterol (VENTOLIN HFA) 108 (90 Base) MCG/ACT inhaler Inhale 2 puffs into the lungs every 4 (four) hours as needed for wheezing. 18 g 4  . atenolol (TENORMIN) 25 MG tablet Take 1 tablet (25 mg total) by mouth daily. 90 tablet 3  . busPIRone (BUSPAR) 10 MG tablet Take 1 tablet (10 mg total) by mouth 3 (three) times daily. 90 tablet 2  . estradiol (ESTRACE) 1 MG tablet TAKE ONE TABLET BY MOUTH DAILY 90 tablet 1  . FLUoxetine (PROZAC) 10 MG tablet Take 1 tablet (10 mg total) by mouth daily. 30 tablet 2  . furosemide (LASIX) 20 MG tablet Take 1 tablet (20 mg total) by mouth daily as needed for edema. (Patient not taking: Reported on 01/03/2020) 30 tablet 3  . gabapentin (NEURONTIN) 400 MG capsule Take 1 capsule (400 mg total) by mouth 3 (three) times daily. 90 capsule 2  . ipratropium-albuterol (DUONEB) 0.5-2.5 (3)  MG/3ML SOLN Take 3 mLs by nebulization every 2 (two) hours as needed (wheeze, SOB). 60 mL 3  . MELATONIN PO Take by mouth.    . methocarbamol (ROBAXIN) 500 MG tablet Take 1 tablet (500 mg total) by mouth 3 (three) times daily. 90 tablet 2  . mirtazapine (REMERON) 15 MG tablet Take 1 tablet (15 mg total) by mouth at bedtime. 30 tablet 2  . montelukast (SINGULAIR) 10 MG tablet Take 1 tablet (10 mg total) by mouth at bedtime. 30 tablet 3  . Multiple Vitamin (MULTIVITAMIN) capsule Take 1 capsule by mouth daily.    . pantoprazole (PROTONIX) 40 MG tablet Take 1 tablet (40 mg total) by mouth daily. 90 tablet 3  . potassium chloride (KLOR-CON) 10 MEQ tablet Take 1 tablet (10 mEq total) by mouth 2 (two) times daily. 180 tablet 3  . prazosin (MINIPRESS) 1 MG capsule Take 1 capsule (1 mg total) by mouth at bedtime. 30 capsule 2  . risperiDONE (RISPERDAL) 1 MG tablet Take 2 tablets (2 mg total) by mouth at bedtime. 60 tablet 2  . traZODone (DESYREL) 50 MG tablet Take 1 tablet (50 mg total) by mouth 3 (three) times daily. 90 tablet 2  . triamterene-hydrochlorothiazide (MAXZIDE-25) 37.5-25 MG tablet Take 1 tablet by mouth daily. 90 tablet 3  . valACYclovir (VALTREX) 500 MG tablet Take 1 tablet (500 mg total) by mouth daily. 30 tablet 5   No current facility-administered medications for this visit.    Musculoskeletal: Strength & Muscle Tone: within normal limits Gait & Station: normal Patient leans: Right  Psychiatric Specialty Exam: Review of Systems  Psychiatric/Behavioral: Negative for dysphoric mood, hallucinations and suicidal ideas. The patient is not nervous/anxious.   All other systems reviewed and are negative.   Last menstrual period 07/28/2015.There is no height or weight on file to calculate BMI.  General Appearance: Casual  Eye Contact:  Good  Speech:  Clear and Coherent  Volume:  Normal  Mood:  NA  Affect:  Appropriate  Thought Process:  Coherent and Descriptions of Associations:  Intact  Orientation:  Full (Time, Place, and Person)  Thought Content:  Logical  Suicidal Thoughts:  No  Homicidal Thoughts:  No  Memory:  NA  Judgement:  Good  Insight:  Fair  Psychomotor Activity:  Normal  Concentration:  Attention Span: Good  Recall:  Good  Fund of Knowledge:Good  Language: Good  Akathisia:  NA  Handed:  Right  AIMS (if indicated):  not done  Assets:  Communication Skills  ADL's:  Intact  Cognition: WNL  Sleep:  Fair   Screenings: GAD-7     Office Visit from 08/25/2019 in [facility] Office Visit from 04/26/2019 in [place] Office Visit from 02/23/2018 in [facility] Office Visit from 02/07/2018 in [facility] Visit from 11/13/2016 in [facility]  Total GAD-7 Score 11 9 20 20 21     PHQ2-9     Office Visit from 08/25/2019 in [facility] Office Visit from 04/26/2019 in [place] Office Visit from 02/23/2018 in [facility] Office Visit from 02/07/2018 in [facility] Office Visit from 11/13/2016 in [facility]  PHQ-2 Total Score 4 2 4 6 4   PHQ-9 Total Score 13 5 20 24 19       Assessment and Plan: Will follow-up in 12weeks and will continue medications as prescribed and requested refills.  Provider refilled medications.  No other concerns at this time.   ICD-10-CM   1. Insomnia, unspecified type  G47.00 traZODone (DESYREL) 50 MG tablet  2. PTSD (post-traumatic stress disorder)  F43.10 mirtazapine (REMERON) 15 MG tablet  3. Bipolar disorder, current episode depressed, severe, with psychotic features ([place])  F31.5 busPIRone (BUSPAR) 10 MG tablet    risperiDONE (RISPERDAL) 1 MG tablet  4. Borderline personality  disorder ([place])  F60.3 FLUoxetine (PROZAC) 10 MG tablet  5. Panic attacks  F41.0 gabapentin (NEURONTIN) 400 MG capsule    prazosin (MINIPRESS) 1 MG capsule     Deloria Lair, NP 6/16/202111:48 PM

## 2020-01-18 ENCOUNTER — Ambulatory Visit (HOSPITAL_COMMUNITY): Payer: Self-pay | Admitting: Licensed Clinical Social Worker

## 2020-01-18 ENCOUNTER — Encounter (HOSPITAL_COMMUNITY): Payer: Self-pay | Admitting: Licensed Clinical Social Worker

## 2020-01-18 ENCOUNTER — Other Ambulatory Visit: Payer: Self-pay

## 2020-01-18 DIAGNOSIS — F410 Panic disorder [episodic paroxysmal anxiety] without agoraphobia: Secondary | ICD-10-CM

## 2020-01-18 DIAGNOSIS — F603 Borderline personality disorder: Secondary | ICD-10-CM

## 2020-01-18 DIAGNOSIS — F3181 Bipolar II disorder: Secondary | ICD-10-CM

## 2020-01-18 DIAGNOSIS — F4310 Post-traumatic stress disorder, unspecified: Secondary | ICD-10-CM
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
## 2020-01-19 ENCOUNTER — Other Ambulatory Visit: Payer: Self-pay | Admitting: Family Medicine

## 2020-01-27 ENCOUNTER — Other Ambulatory Visit: Payer: Self-pay | Admitting: Physician Assistant

## 2020-01-27 DIAGNOSIS — B009 Herpesviral infection, unspecified: Secondary | ICD-10-CM

## 2020-02-02 ENCOUNTER — Other Ambulatory Visit: Payer: Self-pay

## 2020-02-02 ENCOUNTER — Ambulatory Visit (INDEPENDENT_AMBULATORY_CARE_PROVIDER_SITE_OTHER): Payer: No Payment, Other | Admitting: Licensed Clinical Social Worker

## 2020-02-02 DIAGNOSIS — F411 Generalized anxiety disorder: Secondary | ICD-10-CM

## 2020-02-02 DIAGNOSIS — F315 Bipolar disorder, current episode depressed, severe, with psychotic features: Secondary | ICD-10-CM

## 2020-02-02 DIAGNOSIS — F4310 Post-traumatic stress disorder, unspecified: Secondary | ICD-10-CM

## 2020-02-02 DIAGNOSIS — F603 Borderline personality disorder: Secondary | ICD-10-CM

## 2020-02-02 DIAGNOSIS — F410 Panic disorder [episodic paroxysmal anxiety] without agoraphobia: Secondary | ICD-10-CM

## 2020-02-02 NOTE — Progress Notes (Signed)
   THERAPIST PROGRESS NOTE  Virtual Visit via Video Note  I connected with [name] on 02/02/20 at  1:00 PM EDT by a video enabled telemedicine application and verified that I am speaking with the correct person using two identifiers.  Location: Patient: Margaret Lawson  Provider: [facility]    I discussed the limitations of evaluation and management by telemedicine and the availability of in person appointments. The patient expressed understanding and agreed to proceed.  Observations/Objective: Pt was alert and oriented x 5, she presented to session in causal dress (sweatshirt), and had flat affect. Pt engaged well throughout follow up appt.   Laniece reports that she is currently stressed out with move as she in the process of moving in with spouses' parents. She states that this is what is best for her but trying to get all her and spouses belongings down to 1 room has increased stress. She states that she has been very irritable and angry due to her eldest sister holding onto insurance money. Ally states "it is not so much about the money; it is more to do with the fact she is letting money get in the way of family". She wants to be able to express her frustrations to her sister but is afraid that it will cause her eldest sister not to talk to her and that make her feel quality like "I am not a good daughter". Her biggest advocates are her spouse and mother-in-law who have been very supportive throughout her mental health Hx from death/grief, trauma, to depression/anxiety symptoms.     Assessment and Plan:   Pt endorses symptoms for depression, anxiety, insomnia, irritability and anger. Her medications have decreased her severity of symptoms from a 7 to 5. She will remain on medications as instructed and continue to work with therapy to improve coping mechanisms and process emotions.  Pt currently meets criteria for GAD & PTSD.    Follow Up Instructions:   Pt will f/u  with therapy every 2 weeks, f/u with med mgmt. as instructed by their team, start to write down feeling towards sister with end goal of being able to write a letter that pt can choose to share or not to share with her sister.    I discussed the assessment and treatment plan with the patient. The patient was provided an opportunity to ask questions and all were answered. The patient agreed with the plan and demonstrated an understanding of the instructions.   The patient was advised to call back or seek an in-person evaluation if the symptoms worsen or if the condition fails to improve as anticipated.  I provided 55 minutes of non-face-to-face time during this encounter.   Dory Horn, LCSW   Participation Level: Active  Behavioral Response: Casual and Fairly GroomedAlertAnxious and Hopeless  Type of Therapy: Individual Therapy  Treatment Goals addressed: Anxiety  Interventions: CBT  Summary: Skylah Delauter is a 39 y.o. female who presents with PTSD.   Suicidal/Homicidal: Nowithout intent/plan    Plan: Return again in 4 weeks.  Diagnosis: Axis I: Bipolar, Depressed, Panic Disorder and Post Traumatic Stress Disorder    Axis II: Borderline Personality Dis.    Dory Horn, LCSW 02/02/2020

## 2020-02-25 ENCOUNTER — Encounter: Payer: Self-pay | Admitting: Physician Assistant

## 2020-02-25 DIAGNOSIS — I10 Essential (primary) hypertension: Secondary | ICD-10-CM

## 2020-02-26 ENCOUNTER — Other Ambulatory Visit: Payer: Self-pay

## 2020-02-26 ENCOUNTER — Ambulatory Visit (INDEPENDENT_AMBULATORY_CARE_PROVIDER_SITE_OTHER): Payer: No Payment, Other | Admitting: Licensed Clinical Social Worker

## 2020-02-26 DIAGNOSIS — F4310 Post-traumatic stress disorder, unspecified: Secondary | ICD-10-CM | POA: Diagnosis not present

## 2020-02-26 DIAGNOSIS — F315 Bipolar disorder, current episode depressed, severe, with psychotic features: Secondary | ICD-10-CM

## 2020-02-26 MED ORDER — ATENOLOL 25 MG PO TABS
25.0000 mg | ORAL_TABLET | Freq: Two times a day (BID) | ORAL | 3 refills | Status: DC
Start: 2020-02-26 — End: 2021-02-07

## 2020-02-26 MED ORDER — TRIAMTERENE-HCTZ 37.5-25 MG PO TABS: 1.0000 | ORAL_TABLET | Freq: Every day | ORAL | 1 refills | Status: DC
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
## 2020-02-26 NOTE — Telephone Encounter (Signed)
Office note from 6/9 states:   BP is great with lower dose of atenolol and not taking maxide. Will cut atenolol down to 25mg  daily( she wants to stay on it due to helping with migraines) and maxide. Continue to use potassium as needed with lasix for swelling.

## 2020-03-08 ENCOUNTER — Encounter: Payer: Self-pay | Admitting: Physician Assistant

## 2020-03-08 ENCOUNTER — Other Ambulatory Visit: Payer: Self-pay | Admitting: Physician Assistant

## 2020-03-08 ENCOUNTER — Telehealth (INDEPENDENT_AMBULATORY_CARE_PROVIDER_SITE_OTHER): Payer: Self-pay | Admitting: Medical-Surgical

## 2020-03-08 ENCOUNTER — Encounter: Payer: Self-pay | Admitting: Medical-Surgical

## 2020-03-08 DIAGNOSIS — J4541 Moderate persistent asthma with (acute) exacerbation: Secondary | ICD-10-CM

## 2020-03-08 MED ORDER — AZITHROMYCIN 250 MG PO TABS: ORAL_TABLET | ORAL | 0 refills | Status: DC

## 2020-03-08 MED ORDER — ESTRADIOL 1 MG PO TABS: 1.0000 mg | ORAL_TABLET | Freq: Every day | ORAL | 1 refills | Status: DC

## 2020-03-08 MED ORDER — PREDNISONE 50 MG PO TABS: 50.0000 mg | ORAL_TABLET | Freq: Every day | ORAL | 0 refills | Status: DC

## 2020-03-08 MED ORDER — PROMETHAZINE-DM 6.25-15 MG/5ML PO SYRP: 5.0000 mL | ORAL_SOLUTION | Freq: Four times a day (QID) | ORAL | 0 refills | Status: DC | PRN

## 2020-03-08 NOTE — Progress Notes (Signed)
Virtual Visit via Video Note  I connected with Margaret Lawson on 03/08/20 at  1:00 PM EDT by a video enabled telemedicine application and verified that I am speaking with the correct person using two identifiers.   I discussed the limitations of evaluation and management by telemedicine and the availability of in person appointments. The patient expressed understanding and agreed to proceed.  Patient location: home Provider locations: office  Subjective:    CC: chest congestion  HPI: Pleasant 39 year old female presenting via MyChart video visit today with complaints of 1 week of increased chest congestion with shortness of breath, wheezing, cough, post tussive nausea/vomiting.  Cough is productive of clear mucus, worse at night and interrupting sleep.  She did have some sinus symptoms but those have resolved.  Was recently around her nieces kids who were diagnosed with croup.  Denies fever, chills, body aches, diarrhea.  Has had both Moderna Covid vaccines.  Reports her niece's kids were tested for Covid and negative.  Has been using Mucinex DM, albuterol inhaler, and DuoNeb treatments.  She has been using the DuoNeb treatments every 4-6 hours with some relief but feels that she is not able to cough deep enough to be able to clear her lower lobes.  Does not have a peak flow meter or an incentive spirometer at home.  Requesting a refill on her estradiol.  Past medical history, Surgical history, Family history not pertinant except as noted below, Social history, Allergies, and medications have been entered into the medical record, reviewed, and corrections made.   Review of Systems: No fevers, chills, night sweats, weight loss, chest pain, or shortness of breath.   Objective:    General: Speaking clearly in complete sentences without any shortness of breath.  Alert and oriented x3.  Normal judgment. No apparent acute distress.  Impression and Recommendations:    1. Moderate persistent  asthma with acute exacerbation No recent chest x-ray but patient is currently uninsured and the cost is prohibitive.  We will go ahead and treat this as an asthma exacerbation with prednisone burst dose x5 days.  We will also cover for possible pneumonia with azithromycin.  She has tolerated this in the past although she does have erythromycin listed as an allergy.  Okay to continue Mucinex but sending in promethazine DM for cough suppression and management of posttussive nausea/vomiting.  Recommend deep breathing exercises regularly throughout the day.  Advised patient that if she drops by the office, we can provide her with an incentive spirometer.  Recommend obtaining a peak flow meter for use at home. - predniSONE (DELTASONE) 50 MG tablet; Take 1 tablet (50 mg total) by mouth daily.  Dispense: 5 tablet; Refill: 0 - promethazine-dextromethorphan (PROMETHAZINE-DM) 6.25-15 MG/5ML syrup; Take 5 mLs by mouth 4 (four) times daily as needed for cough.  Dispense: 118 mL; Refill: 0 - azithromycin (ZITHROMAX) 250 MG tablet; Take 2 tablets (500mg ) on day 1 then take 1 tablet (250mg ) daily on days 2-5.  Dispense: 6 tablet; Refill: 0  Estradiol refilled.  Return if symptoms worsen or fail to improve.  20 minutes of non-face-to-face time was provided during this encounter.   I discussed the assessment and treatment plan with the patient. The patient was provided an opportunity to ask questions and all were answered. The patient agreed with the plan and demonstrated an understanding of the instructions.   The patient was advised to call back or seek an in-person evaluation if the symptoms worsen or if the condition fails to  improve as anticipated.  Clearnce Sorrel, DNP, APRN, FNP-BC [facility]

## 2020-03-11 ENCOUNTER — Ambulatory Visit (INDEPENDENT_AMBULATORY_CARE_PROVIDER_SITE_OTHER): Payer: No Payment, Other | Admitting: Licensed Clinical Social Worker

## 2020-03-11 ENCOUNTER — Other Ambulatory Visit: Payer: Self-pay

## 2020-03-11 DIAGNOSIS — F3181 Bipolar II disorder: Secondary | ICD-10-CM | POA: Diagnosis not present

## 2020-03-11 DIAGNOSIS — F4310 Post-traumatic stress disorder, unspecified: Secondary | ICD-10-CM

## 2020-03-11 DIAGNOSIS — F411 Generalized anxiety disorder: Secondary | ICD-10-CM

## 2020-03-11 NOTE — Progress Notes (Signed)
   THERAPIST PROGRESS NOTE  Session Time: 33  Therapist Response:   Subjective/Objective:  Margaret Lawson was alert and oriented x 5. She was dressed casually with good eye contact. Pt presented today with flat depressed affect/mood. Margaret Lawson reports that her depression and sleep have improved since her recent move to her in-laws house. Pt has been fighting asthma/bacaeririal infection with antibiotics for the past few days. But, due to the move her anxiety has increased this is because she has increased her social interactions stating that she and her mother-in-law baby sit pt nieces and nephews. Margaret Lawson currently has been trying to process and gain closure on her fathers passing, she had been working on a letter to her father after he had passed but stated "I was just not ready at the time". Pt reports closure has been improved and she believes that her father now is at peace with pt mother in heaven. Margaret Lawson reports that she has decided that it will be best to part ways with her eldest sister besides special occasions. This is due to family conflict that it has caused since the passing of their father.    Assessment/Plan: Pt endorses symptoms of sadness, self doubt, overwhlemed, tension, and over stimulations in social settings. Pt currently meets criteria for Bipolar disorder currently in depressive state and GAD. Plan will be to continue to work on homework provided by LCSW. Pt will write letter to her father and burn it and also pt will write down 3 to 5 positive affirmations about herself.   Participation Level: Active  Behavioral Response: CasualAlertAnxious  Type of Therapy: Individual Therapy  Treatment Goals addressed: Anxiety  Interventions: CBT and Supportive  Summary: Margaret Lawson is a 39 y.o. female who presents with Bipolar depressive and PTSD.   Suicidal/Homicidal: NAwithout intent/plan   Plan: Return again in 03/25/2020.  Diagnosis: Axis I: Bipolar, Depressed and Post  Traumatic Stress Disorder   Dory Horn, LCSW 03/11/2020

## 2020-03-23 ENCOUNTER — Other Ambulatory Visit: Payer: Self-pay | Admitting: Physician Assistant

## 2020-03-25 ENCOUNTER — Ambulatory Visit (HOSPITAL_COMMUNITY): Payer: No Payment, Other | Admitting: Licensed Clinical Social Worker

## 2020-03-25 ENCOUNTER — Other Ambulatory Visit: Payer: Self-pay

## 2020-03-25 DIAGNOSIS — F411 Generalized anxiety disorder: Secondary | ICD-10-CM

## 2020-03-25 DIAGNOSIS — F3181 Bipolar II disorder: Secondary | ICD-10-CM

## 2020-03-25 NOTE — Progress Notes (Signed)
   THERAPIST PROGRESS NOTE  Session Time: 39  Virtual Visit via Video Note  I connected with Margaret Lawson on 03/25/20 at  1:00 PM EDT by a video enabled telemedicine application and verified that I am speaking with the correct person using two identifiers.  Location: Patient: [facility]  Provider: [facility]    I discussed the limitations of evaluation and management by telemedicine and the availability of in person appointments. The patient expressed understanding and agreed to proceed.  Therapist Response:   Subjective/objective:  Pt was alert and oriented x 5. She was dressed casually and was well engaged throughout evidence as evidence by note below. Pt presented with flat affect/mood.   Pt primary stressor has been family and transition. Currently pt has moved in with her spouse's parents. Overall, the move has been good. But pt has had to get use to more people coming in and out of the house. Example being her mother-in-law baby sits pt sister-in-law children few times per week. Margaret Lawson also states that her youngest sister provided her with a t-shirt of her fathers. Pt is not sure how to feel about this. She believes that it was a good gesture, but this is not the way she grieves. Pt reports that she is further along and accepts her father passing where her baby sister still is in the sadness part of it.   Pt another sister who is her oldest pt reports in not doing well from personal standpoint. Margaret Lawson has not got along with this sister since her father had passed because of the handling of an insurance policy. Pt reports she had a dream that her sister had passed away and she did not feel sad about it. Pt believes this is a sign that she really does not need her sister in her life. Margaret Lawson overall is doing well.    Assessment/Plan:  Pt endorses symptoms of tension, worry, insomnia, panic attacks, and sadness. She currently meets criteria for bipolar 2 currently  depressed. Pt has been taking all medication as prescribed and will follow up with medication management in 2 weeks. Plan for pt is to journal 1 new adjective about herself per week. She will also repeat the adjectives in the mirror 1 to 2 times weekly, and she will attempt to write a letter to her father to help with her closure process.   I discussed the assessment and treatment plan with the patient. The patient was provided an opportunity to ask questions and all were answered. The patient agreed with the plan and demonstrated an understanding of the instructions.   The patient was advised to call back or seek an in-person evaluation if the symptoms worsen or if the condition fails to improve as anticipated.  I provided 45 minutes of non-face-to-face time during this encounter.   Dory Horn, LCSW   Participation Level: Active  Behavioral Response: Casual and Fairly GroomedAlertflat  Type of Therapy: Individual Therapy  Treatment Goals addressed: Diagnosis: Bipolar 2 and GAD  Interventions: CBT and Supportive  Summary: Margaret Lawson is a 39 y.o. female who presents with GAD and Bipolar 2 depressed.   Suicidal/Homicidal: Nowithout intent/plan   Plan: Return again in 2 weeks.   Dory Horn, LCSW 03/25/2020

## 2020-04-06 ENCOUNTER — Other Ambulatory Visit: Payer: Self-pay | Admitting: Nurse Practitioner

## 2020-04-09 ENCOUNTER — Other Ambulatory Visit: Payer: Self-pay

## 2020-04-09 ENCOUNTER — Ambulatory Visit (INDEPENDENT_AMBULATORY_CARE_PROVIDER_SITE_OTHER): Payer: No Payment, Other | Admitting: Licensed Clinical Social Worker

## 2020-04-09 DIAGNOSIS — F411 Generalized anxiety disorder: Secondary | ICD-10-CM | POA: Diagnosis not present

## 2020-04-09 DIAGNOSIS — F3181 Bipolar II disorder: Secondary | ICD-10-CM

## 2020-04-09 DIAGNOSIS — F4310 Post-traumatic stress disorder, unspecified: Secondary | ICD-10-CM

## 2020-04-09 NOTE — Progress Notes (Signed)
   THERAPIST PROGRESS NOTE  Virtual Visit via Video Note  I connected with [name] on 04/09/20 at  1:00 PM EDT by a video enabled telemedicine application and verified that I am speaking with the correct person using two identifiers.  Location: Patient: [facility]  Provider: [facility]    I discussed the limitations of evaluation and management by telemedicine and the availability of in person appointments. The patient expressed understanding and agreed to proceed.   Therapist Response:     Subjective/objective: Pt was alert and oriented x 5. She was dressed casually and groomed well. Elika was well engaged in therapy session. Pt presented with euthymic mood/affect.   Pt reports that she has been stressed due to a transition of a new job by her spouse. Spouse was working at a [employer] and was continuously stressed with it. He now has a job as a Training and development officer at a bar/grill. He does have to work longer hours for less pay, but pt reports that he is way less stressed. Cynthis states that her anxiety symptoms stress and tension from this situation come from the hours he now works 4 days weekly for 12-hour shifts (1PM to 1AM). She does get to see him for a few hours before he goes into work, but then will not see him the rest of the night.   Shareen also reports that she did write a letter to her father. She feels a sense of closure in the passing of her father. She states "I know I am okay not having both parents. They are together now". She also has been continuing her progress with writing of her adjectives about herself adding two more words "Loving and caring" to the list.    Assessment/plan: Pt endorses symptoms for worry, tension and irritability. She also has symptoms for sadness and worthlessness. She currently meets criteria for bipolar 2 disorder and GAD. Plan moving forward use deep breathing taught in session for anxiety symptoms. Pt also to use exposure  therapy in the mirror to say the adjectives about herself 3 x weekly for a total of 20 seconds. Pt is uncomfortable seeing her reflection the mirror. Priti will meditate 1 x weekly.    I discussed the assessment and treatment plan with the patient. The patient was provided an opportunity to ask questions and all were answered. The patient agreed with the plan and demonstrated an understanding of the instructions.   The patient was advised to call back or seek an in-person evaluation if the symptoms worsen or if the condition fails to improve as anticipated.  I provided 50 minutes of non-face-to-face time during this encounter.   Dory Horn, LCSW   Participation Level: Active  Behavioral Response: Casual and Fairly GroomedAlertEuthymic  Type of Therapy: Individual Therapy  Treatment Goals addressed: Diagnosis: PTSD and GAD   Interventions: CBT and Supportive  Summary: Devine Dant is a 39 y.o. female who presents with GAD and PTSD.   Suicidal/Homicidal: Nowithout intent/plan  Plan: Return again in 3 weeks.    Dory Horn, LCSW 04/09/2020

## 2020-04-24 ENCOUNTER — Telehealth (HOSPITAL_COMMUNITY): Payer: Self-pay

## 2020-05-07 ENCOUNTER — Other Ambulatory Visit: Payer: Self-pay

## 2020-05-07 ENCOUNTER — Ambulatory Visit (INDEPENDENT_AMBULATORY_CARE_PROVIDER_SITE_OTHER): Payer: No Payment, Other | Admitting: Licensed Clinical Social Worker

## 2020-05-07 DIAGNOSIS — F411 Generalized anxiety disorder: Secondary | ICD-10-CM

## 2020-05-07 DIAGNOSIS — F3181 Bipolar II disorder: Secondary | ICD-10-CM | POA: Diagnosis not present

## 2020-05-07 DIAGNOSIS — F4310 Post-traumatic stress disorder, unspecified: Secondary | ICD-10-CM

## 2020-05-07 NOTE — Progress Notes (Signed)
   THERAPIST PROGRESS NOTE  Virtual Visit via Video Note  I connected with [name] on 05/07/20 at  3:00 PM EDT by a video enabled telemedicine application and verified that I am speaking with the correct person using two identifiers.  Location: Patient: [facility]  Provider: [facility]    I discussed the limitations of evaluation and management by telemedicine and the availability of in person appointments. The patient expressed understanding and agreed to proceed.  Therapist Response:    Subjective/objective:  Pt was alert and oriented x 5. She was dressed casually and engaged well throughout therapy session. She presented today with depressed and flat mood/affect. Pt was cooperative and had good eye contact   Pt reports overall doing well. She has stopped her Mini press and Risperdal. Pt primary goal from her medications is to wean off them all together. Adaleah does state she understands this is a process and wants to do it in the right way with the supervision or a medication provider. Primary stressor for pt has been the selling of her own home about 8 weeks ago pt moved into her spouse parents' home. This has been a good transition for pt. She really enjoys her in-laws. Her old trailer was a reminder of her father who had passed away some time ago. Pt is relieved to have it sold and to move on.   Pt spouse has recently started a new job as stated in previous note. He works 3 to 4 days per week for about 14-hour shifts. Pt reports this has been a change for her as she does not get to see him unless it is on his day off.      Assessment/plan: Pt endorse symptoms for depression for anxiety and depression for sadness, irritability, tension, worry, and insomnia. Currently she does meet criteria for Bipolar 2 currently depress and GAD. She has stopped 2 of her medications as stated above. Plan moving forward for therapy. Pt will walk 2 x weekly, she will continue diet  that she has start, and pt will continue to meditate daily. She will also change stating three adjectives about herself in the mirror to 1 x weekly. Nyleah will f/u in 4 weeks.    I discussed the assessment and treatment plan with the patient. The patient was provided an opportunity to ask questions and all were answered. The patient agreed with the plan and demonstrated an understanding of the instructions.   The patient was advised to call back or seek an in-person evaluation if the symptoms worsen or if the condition fails to improve as anticipated.  I provided 45 minutes of non-face-to-face time during this encounter.   Dory Horn, LCSW   Participation Level: Active  Behavioral Response: Casual and Fairly GroomedAlertDepressed  Type of Therapy: Individual Therapy  Treatment Goals addressed: Diagnosis: bipolar 2 depression   Interventions: CBT and Solution Focused  Summary: Nakiesha Rumsey is a 39 y.o. female who presents with bipolar 2 disorder currently depressed and GAD.   Suicidal/Homicidal: Nowithout intent/plan    Plan: Return again in 4 weeks.      Dory Horn, LCSW 05/07/2020

## 2020-05-09 ENCOUNTER — Encounter (HOSPITAL_COMMUNITY): Payer: Self-pay | Admitting: Psychiatry

## 2020-05-09 ENCOUNTER — Telehealth (INDEPENDENT_AMBULATORY_CARE_PROVIDER_SITE_OTHER): Payer: No Payment, Other | Admitting: Psychiatry

## 2020-05-09 ENCOUNTER — Other Ambulatory Visit: Payer: Self-pay

## 2020-05-09 DIAGNOSIS — F4310 Post-traumatic stress disorder, unspecified: Secondary | ICD-10-CM | POA: Diagnosis not present

## 2020-05-09 DIAGNOSIS — F410 Panic disorder [episodic paroxysmal anxiety] without agoraphobia: Secondary | ICD-10-CM | POA: Diagnosis not present

## 2020-05-09 DIAGNOSIS — F3181 Bipolar II disorder: Secondary | ICD-10-CM | POA: Diagnosis not present

## 2020-05-09 MED ORDER — MIRTAZAPINE 15 MG PO TABS: 15.0000 mg | ORAL_TABLET | Freq: Every day | ORAL | 2 refills | Status: DC

## 2020-05-09 MED ORDER — TRAZODONE HCL 50 MG PO TABS: 50.0000 mg | ORAL_TABLET | Freq: Every evening | ORAL | 1 refills | Status: DC | PRN

## 2020-05-09 MED ORDER — BUSPIRONE HCL 10 MG PO TABS: 10.0000 mg | ORAL_TABLET | Freq: Three times a day (TID) | ORAL | 1 refills | Status: DC

## 2020-05-09 MED ORDER — GABAPENTIN 400 MG PO CAPS: 400.0000 mg | ORAL_CAPSULE | Freq: Two times a day (BID) | ORAL | 1 refills | Status: DC

## 2020-05-09 NOTE — Progress Notes (Signed)
[place] MD/PA/NP OP Progress Note  Virtual Visit via Video Note  I connected with Margaret Lawson on 05/09/20 at  2:40 PM EDT by a video enabled telemedicine application and verified that I am speaking with the correct person using two identifiers.  Location: Patient: Home Provider: Clinic   I discussed the limitations of evaluation and management by telemedicine and the availability of in person appointments. The patient expressed understanding and agreed to proceed.  I provided 15 minutes of non-face-to-face time during this encounter.     05/09/2020 2:47 PM Margaret Lawson  MRN:  [id number]  Chief Complaint:  " I am doing much better."   HPI: Patient reported that she is doing much better now.  She informed that she has made a lot of changes in her environment and that has reduced the stress she was dealing with.  She also reported having a good therapeutic rapport with her therapist.  She feels she has been more progress with the help of the therapy. She informed that she stopped taking the risperidone and the prazosin. She asked if her dose of gabapentin can be reduced to 400 mg twice a day instead of 3 times a day. She informed that she is taking trazodone 50 mg along with her mirtazapine 15 mg and this combination helps her sleep well.  She informed that her goal is to get off of most of the medicines slowly. Patient was informed that mirtazapine and Prozac with antidepressants and if she wants we can discontinue Prozac and maintain her on mirtazapine itself.  Patient was agreeable to this recommendation. She informed that she was started on Prozac in 2022/09/12 after her father passed away.  And the plan was to take it for a few months and then discontinue it eventually.  She denies any other concerns at this time.  Visit Diagnosis:    ICD-10-CM   1. Bipolar 2 disorder, major depressive episode ([place])  F31.81   2. PTSD (post-traumatic stress disorder)  F43.10   3. Panic  attacks  F41.0     Past Psychiatric History: Bipolar disorder, PTSD, panic attacks, borderline personality disorder  Past Medical History:  Past Medical History:  Diagnosis Date  . Anxiety    severe  . Asthma, chronic 12/27/2013  . Bipolar 2 disorder ([place])   . Essential hypertension, benign 12/27/2013  . Fibromyalgia 12/27/2013  . GERD (gastroesophageal reflux disease)   . Headache    migraines, over stimulation  . Leukocytosis 12/27/2013  . Low iron   . PCOS (polycystic ovarian syndrome)   . Perforation of left tympanic membrane 12/27/2013  . Poor dentition    right side upper and lower  . Restless leg syndrome   . Right leg pain   . Teeth clenching   . Teeth grinding   . Weakness    when exposed to hot or cold temperatures    Past Surgical History:  Procedure Laterality Date  . CHOLECYSTECTOMY    . ROBOTIC ASSISTED TOTAL HYSTERECTOMY WITH BILATERAL SALPINGO OOPHERECTOMY Bilateral 09/10/2015   Procedure: ROBOTIC ASSISTED TOTAL HYSTERECTOMY WITH BILATERAL SALPINGECTOMY;  Surgeon: Lavonia Drafts, MD;  Location: [facility];  Service: Gynecology;  Laterality: Bilateral;  . WISDOM TOOTH EXTRACTION      Family Psychiatric History: Dad- depression, Sister- depression  Family History:  Family History  Problem Relation Age of Onset  . Aneurysm Mother        brain  . Depression Father   . Heart disease Father   . Mitral valve  prolapse Father   . Diabetes Father   . Hypertension Father   . Depression Sister   . Breast cancer Paternal Grandfather   . Breast cancer Paternal Aunt     Social History:  Social History   Socioeconomic History  . Marital status: Married    Spouse name: Not on file  . Number of children: Not on file  . Years of education: Not on file  . Highest education level: Not on file  Occupational History  . Occupation: unemployed  Tobacco Use  . Smoking status: Current Every Day Smoker    Packs/day: 0.50    Years: 20.00    Pack years: 10.00     Types: Cigarettes  . Smokeless tobacco: Never Used  . Tobacco comment: uses 6mg  vapes  Substance and Sexual Activity  . Alcohol use: No    Alcohol/week: 0.0 standard drinks  . Drug use: No  . Sexual activity: Yes    Partners: Male    Birth control/protection: None  Other Topics Concern  . Not on file  Social History Narrative  . Not on file   Social Determinants of Health   Financial Resource Strain: Low Risk   . Difficulty of Paying Living Expenses: Not hard at all  Food Insecurity: Food Insecurity Present  . Worried About Charity fundraiser in the Last Year: Never true  . Ran Out of Food in the Last Year: Sometimes true  Transportation Needs: No Transportation Needs  . Lack of Transportation (Medical): No  . Lack of Transportation (Non-Medical): No  Physical Activity: Inactive  . Days of Exercise per Week: 0 days  . Minutes of Exercise per Session: 0 min  Stress: Stress Concern Present  . Feeling of Stress : Rather much  Social Connections: Moderately Isolated  . Frequency of Communication with Friends and Family: More than three times a week  . Frequency of Social Gatherings with Friends and Family: Once a week  . Attends Religious Services: Never  . Active Member of Clubs or Organizations: No  . Attends Archivist Meetings: Never  . Marital Status: Married    Allergies:  Allergies  Allergen Reactions  . Clarithromycin Nausea And Vomiting  . Adhesive [Tape] Other (See Comments)    Blisters, paper tape is worse  . Ambien [Zolpidem Tartrate]     Insomnia.  . Celecoxib Nausea And Vomiting  . Doxycycline     Double vision   . Erythromycin Nausea And Vomiting  . Lyrica [Pregabalin]     Induced lactation  . Morphine And Related     Decrease bp  . Penicillins Hives    Has patient had a PCN reaction causing immediate rash, facial/tongue/throat swelling, SOB or lightheadedness with hypotension: Yes Has patient had a PCN reaction causing severe rash  involving mucus membranes or skin necrosis: No Has patient had a PCN reaction that required hospitalization No Has patient had a PCN reaction occurring within the last 10 years: Yes If all of the above answers are "NO", then may proceed with Cephalosporin use.   . Latex Rash    Metabolic Disorder Labs: Lab Results  Component Value Date   HGBA1C 5.8 (H) 06/25/2015   MPG 120 (H) 06/25/2015   No results found for: PROLACTIN Lab Results  Component Value Date   CHOL 213 (H) 02/27/2015   TRIG 210 (H) 02/27/2015   HDL 34 (L) 02/27/2015   CHOLHDL 6.3 (H) 02/27/2015   VLDL 42 (H) 02/27/2015   LDLCALC 137 (  H) 02/27/2015   LDLCALC 116 (H) 01/02/2014   Lab Results  Component Value Date   TSH 1.29 11/13/2016   TSH 1.748 02/27/2015    Therapeutic Level Labs: No results found for: LITHIUM No results found for: VALPROATE No components found for:  CBMZ  Current Medications: Current Outpatient Medications  Medication Sig Dispense Refill  . acetaminophen (TYLENOL) 500 MG tablet Take 500 mg by mouth every 6 (six) hours as needed for mild pain or moderate pain.    Marland Kitchen albuterol (VENTOLIN HFA) 108 (90 Base) MCG/ACT inhaler INHALE TWO PUFFS BY MOUTH EVERY 4 HOURS AS NEEDED FOR WHEEZING 18 g 3  . atenolol (TENORMIN) 25 MG tablet Take 1 tablet (25 mg total) by mouth 2 (two) times daily. 180 tablet 3  . azithromycin (ZITHROMAX) 250 MG tablet Take 2 tablets (500mg ) on day 1 then take 1 tablet (250mg ) daily on days 2-5. 6 tablet 0  . estradiol (ESTRACE) 1 MG tablet Take 1 tablet (1 mg total) by mouth daily. 90 tablet 1  . FLUoxetine (PROZAC) 10 MG tablet Take 1 tablet (10 mg total) by mouth daily. 30 tablet 2  . furosemide (LASIX) 20 MG tablet Take 1 tablet (20 mg total) by mouth daily as needed for edema. 30 tablet 3  . gabapentin (NEURONTIN) 400 MG capsule Take 1 capsule (400 mg total) by mouth 3 (three) times daily. 90 capsule 2  . ipratropium-albuterol (DUONEB) 0.5-2.5 (3) MG/3ML SOLN Take 3 mLs  by nebulization every 2 (two) hours as needed (wheeze, SOB). 60 mL 3  . MELATONIN PO Take by mouth.    . methocarbamol (ROBAXIN) 500 MG tablet Take 1 tablet (500 mg total) by mouth 3 (three) times daily. 90 tablet 2  . mirtazapine (REMERON) 15 MG tablet Take 1 tablet (15 mg total) by mouth at bedtime. 30 tablet 2  . montelukast (SINGULAIR) 10 MG tablet TAKE ONE TABLET BY MOUTH AT BEDTIME 30 tablet 3  . Multiple Vitamin (MULTIVITAMIN) capsule Take 1 capsule by mouth daily.    . pantoprazole (PROTONIX) 40 MG tablet TAKE ONE TABLET BY MOUTH DAILY 90 tablet 2  . potassium chloride (KLOR-CON) 10 MEQ tablet Take 1 tablet (10 mEq total) by mouth 2 (two) times daily. 180 tablet 3  . prazosin (MINIPRESS) 1 MG capsule Take 1 capsule (1 mg total) by mouth at bedtime. (Patient not taking: Reported on 03/08/2020) 30 capsule 2  . predniSONE (DELTASONE) 50 MG tablet Take 1 tablet (50 mg total) by mouth daily. 5 tablet 0  . promethazine-dextromethorphan (PROMETHAZINE-DM) 6.25-15 MG/5ML syrup Take 5 mLs by mouth 4 (four) times daily as needed for cough. 118 mL 0  . risperiDONE (RISPERDAL) 1 MG tablet Take 2 tablets (2 mg total) by mouth at bedtime. 60 tablet 2  . traZODone (DESYREL) 50 MG tablet Take 1 tablet (50 mg total) by mouth 3 (three) times daily. 90 tablet 2  . triamterene-hydrochlorothiazide (MAXZIDE-25) 37.5-25 MG tablet Take 1 tablet by mouth daily. 90 tablet 3  . triamterene-hydrochlorothiazide (MAXZIDE-25) 37.5-25 MG tablet Take 1 tablet by mouth daily. (Patient not taking: Reported on 03/08/2020) 90 tablet 1  . valACYclovir (VALTREX) 500 MG tablet TAKE ONE TABLET BY MOUTH DAILY 30 tablet 5   No current facility-administered medications for this visit.     Psychiatric Specialty Exam: Review of Systems  Last menstrual period 07/28/2015.There is no height or weight on file to calculate BMI.  General Appearance: Fairly Groomed  Eye Contact:  Good  Speech:  Clear and  Coherent and Normal Rate   Volume:  Normal  Mood:  Euthymic  Affect:  Congruent  Thought Process:  Goal Directed and Descriptions of Associations: Intact  Orientation:  Full (Time, Place, and Person)  Thought Content: Logical   Suicidal Thoughts:  No  Homicidal Thoughts:  No  Memory:  Immediate;   Good Recent;   Good  Judgement:  Fair  Insight:  Fair  Psychomotor Activity:  Normal  Concentration:  Concentration: Good and Attention Span: Good  Recall:  Good  Fund of Knowledge: Good  Language: Good  Akathisia:  Negative  Handed:  Right  AIMS (if indicated): not done  Assets:  Communication Skills Desire for Improvement Financial Resources/Insurance Housing  ADL's:  Intact  Cognition: WNL  Sleep:  Good   Screenings: GAD-7     Counselor from [facility]/18/2020 in [facility] Office Visit from 08/25/2019 in [place] Office Visit from 04/26/2019 in [place] Office Visit from 02/23/2018 in [place] Visit from 02/07/2018 in [place]  Total GAD-7 Score 14 11 9 20 20     PHQ2-9     Office Visit from 08/25/2019 in [place] Office Visit from 04/26/2019 in [place] Office Visit from 02/23/2018 in [place] Office Visit from 02/07/2018 in [place] Office Visit from 11/13/2016 in [place]  PHQ-2 Total Score 4 2 4 6 4   PHQ-9 Total Score 13 5 20 24 19        Assessment and Plan: Patient is doing fairly well on her current regimen.  She would like to reduce the dose of gabapentin and also wants to see if she can manage with just one antidepressant namely mirtazapine because it also helps with sleep.  1. Bipolar 2 disorder, major depressive episode  (H[facility])  - mirtazapine (REMERON) 15 MG tablet; Take 1 tablet (15 mg total) by mouth at bedtime.  Dispense: 30 tablet; Refill: 2 - traZODone (DESYREL) 50 MG tablet; Take 1 tablet (50 mg total) by mouth at bedtime as needed for sleep.  Dispense: 30 tablet; Refill: 1 -Reduce gabapentin (NEURONTIN) 400 MG capsule; Take 1 capsule (400 mg total) by mouth 2 (two) times daily.  Dispense: 60 capsule; Refill: 1 - Discontinue Prozac.  2. PTSD (post-traumatic stress disorder)  - mirtazapine (REMERON) 15 MG tablet; Take 1 tablet (15 mg total) by mouth at bedtime.  Dispense: 30 tablet; Refill: 2  3. Panic attacks  - mirtazapine (REMERON) 15 MG tablet; Take 1 tablet (15 mg total) by mouth at bedtime.  Dispense: 30 tablet; Refill: 2 - busPIRone (BUSPAR) 10 MG tablet; Take 1 tablet (10 mg total) by mouth 3 (three) times daily.  Dispense: 90 tablet; Refill: 1  F/up in 2 months. Continue individual therapy.  N[place] Crane, MD 05/09/2020, 2:47 PM

## 2020-05-20 ENCOUNTER — Ambulatory Visit (HOSPITAL_COMMUNITY): Payer: Self-pay | Admitting: Licensed Clinical Social Worker

## 2020-05-26 ENCOUNTER — Other Ambulatory Visit: Payer: Self-pay | Admitting: Physician Assistant

## 2020-05-26 DIAGNOSIS — M797 Fibromyalgia: Secondary | ICD-10-CM

## 2020-06-03 ENCOUNTER — Ambulatory Visit (HOSPITAL_COMMUNITY): Payer: No Payment, Other | Admitting: Licensed Clinical Social Worker

## 2020-06-03 ENCOUNTER — Telehealth (HOSPITAL_COMMUNITY): Payer: Self-pay | Admitting: Licensed Clinical Social Worker

## 2020-06-03 ENCOUNTER — Other Ambulatory Visit: Payer: Self-pay

## 2020-06-03 NOTE — Telephone Encounter (Signed)
LCSW sent two links to pt phone after 10 minutes with pt not arriving in video chat, LCSW called and spoke with pt. Pt states that she had forgot about the appointments  and needed to reschedule. Pt already had 2 appointments in advance for 11/28 and 12/21 pt was agreeable to keep those.

## 2020-06-17 ENCOUNTER — Ambulatory Visit (HOSPITAL_COMMUNITY): Payer: Self-pay | Admitting: Licensed Clinical Social Worker

## 2020-06-21 ENCOUNTER — Other Ambulatory Visit: Payer: Self-pay | Admitting: Nurse Practitioner

## 2020-06-24 ENCOUNTER — Ambulatory Visit (INDEPENDENT_AMBULATORY_CARE_PROVIDER_SITE_OTHER): Payer: No Payment, Other | Admitting: Licensed Clinical Social Worker

## 2020-06-24 ENCOUNTER — Other Ambulatory Visit: Payer: Self-pay

## 2020-06-24 DIAGNOSIS — F3181 Bipolar II disorder: Secondary | ICD-10-CM

## 2020-06-24 NOTE — Progress Notes (Signed)
   THERAPIST PROGRESS NOTE Virtual Visit via Video Note  I connected with [name] on 06/24/20 at  1:00 PM EST by a video enabled telemedicine application and verified that I am speaking with the correct person using two identifiers.  Location: Patient: [facility]  Provider: [facility]    I discussed the limitations of evaluation and management by telemedicine and the availability of in person appointments. The patient expressed understanding and agreed to proceed.   Session Time: 69  Therapist Response:     Subjective/Objective:  Pt was alert and oriented x 5. She was dressed casually and engaged well throughout f/u therapy session. Esteen presented today with depressed mood/affect.   Primary stressor for pt is increase depression due to family conflict. Pt states that her depression has been known to increase during the holiday season. Pt over Thanksgiving attempted to have her sister over to pt in-law's home. Sakia stated that her sister was rude which caused pt stress and tension.   LCSW utilized CBT and supportive therapy as primary intervention. Pt reports that she needs to establish boundaries with her sister before she has another interaction that result in more stress for the pt. Pt to write down things that pt can do for next time to narrative of the situation. Keiri was agreeable to this. Pt states that her hygiene has suffer with her increase depression. LCSW utilized solution focused therapy. Pt to start to write down in a planner day she would like to shower along with 1 activity she would like to accomplish. Andelyn was agreeable to that as well.    Assessment/Plan: Pt endorse symptoms for depression sadness, worthlessness, irritability, and hopelessness. She does meet criteria for bipolar 2 currently depressed. Sui is currently taking all medication as prescribed. Plan moving forward for pt is to utilize a planner to maintain a more  structured schedule and to write down a list of boundaries she would like to set with herself and sister.      I discussed the assessment and treatment plan with the patient. The patient was provided an opportunity to ask questions and all were answered. The patient agreed with the plan and demonstrated an understanding of the instructions.   The patient was advised to call back or seek an in-person evaluation if the symptoms worsen or if the condition fails to improve as anticipated.  I provided 45 minutes of non-face-to-face time during this encounter.   Dory Horn, LCSW   Participation Level: Active  Behavioral Response: Casual and Fairly GroomedAlertDepressed  Type of Therapy: Individual Therapy  Treatment Goals addressed: Diagnosis: Depression   Interventions: CBT and Supportive  Summary: Venetia Prewitt is a 39 y.o. female who presents with bipolar 2 depressed.   Suicidal/Homicidal: Nowithout intent/plan   Plan: Return again in 4 weeks.      Dory Horn, LCSW 06/24/2020

## 2020-07-08 ENCOUNTER — Other Ambulatory Visit: Payer: Self-pay

## 2020-07-08 ENCOUNTER — Telehealth (INDEPENDENT_AMBULATORY_CARE_PROVIDER_SITE_OTHER): Payer: No Payment, Other | Admitting: Psychiatry

## 2020-07-08 ENCOUNTER — Encounter (HOSPITAL_COMMUNITY): Payer: Self-pay | Admitting: Psychiatry

## 2020-07-08 DIAGNOSIS — F4310 Post-traumatic stress disorder, unspecified: Secondary | ICD-10-CM | POA: Diagnosis not present

## 2020-07-08 DIAGNOSIS — F410 Panic disorder [episodic paroxysmal anxiety] without agoraphobia: Secondary | ICD-10-CM | POA: Diagnosis not present

## 2020-07-08 DIAGNOSIS — F3181 Bipolar II disorder: Secondary | ICD-10-CM

## 2020-07-08 MED ORDER — GABAPENTIN 400 MG PO CAPS: 400.0000 mg | ORAL_CAPSULE | Freq: Two times a day (BID) | ORAL | 1 refills | Status: DC

## 2020-07-08 MED ORDER — TRAZODONE HCL 50 MG PO TABS: 50.0000 mg | ORAL_TABLET | Freq: Every evening | ORAL | 1 refills | Status: DC | PRN

## 2020-07-08 MED ORDER — BUSPIRONE HCL 10 MG PO TABS: 10.0000 mg | ORAL_TABLET | Freq: Three times a day (TID) | ORAL | 1 refills | Status: DC

## 2020-07-08 MED ORDER — MIRTAZAPINE 15 MG PO TABS: 15.0000 mg | ORAL_TABLET | Freq: Every day | ORAL | 1 refills | Status: DC

## 2020-07-08 NOTE — Progress Notes (Signed)
[place] MD/PA/NP OP Progress Note  Virtual Visit via Video Note  I connected with Margaret Lawson on 07/08/20 at  1:20 PM EST by a video enabled telemedicine application and verified that I am speaking with the correct person using two identifiers.  Location: Patient: Home Provider: Clinic   I discussed the limitations of evaluation and management by telemedicine and the availability of in person appointments. The patient expressed understanding and agreed to proceed.  I provided 16 minutes of non-face-to-face time during this encounter.     07/08/2020 1:33 PM Margaret Lawson  MRN:  [id number]  Chief Complaint:  " This time of the year is rough."  HPI: Patient reported that she did not really miss Prozac after it was discontinued.  However she did feel kind of depressed after the time changed and the days got shorter.  She stated that she lost her father earlier this year in January.  Therefore this part of the year the holiday season is kind of rough for her.  She stated that she is seeing her therapist regularly and does not feel that she needs to change any of her medicines.  She stated that she understands that she has to live with some of the things and learn coping mechanisms to deal with the stressors. She mentioned that she will be spending time with her family for Christmas holiday.  She informed that she will be visiting her sister-in-law on Christmas and she is planning to prepare a few items to eat for that event.   Visit Diagnosis:    ICD-10-CM   1. Panic attacks  F41.0 busPIRone (BUSPAR) 10 MG tablet    mirtazapine (REMERON) 15 MG tablet  2. Bipolar 2 disorder, major depressive episode (HCC)  F31.81 mirtazapine (REMERON) 15 MG tablet    traZODone (DESYREL) 50 MG tablet    gabapentin (NEURONTIN) 400 MG capsule  3. PTSD (post-traumatic stress disorder)  F43.10 mirtazapine (REMERON) 15 MG tablet    Past Psychiatric History: Bipolar disorder, PTSD, panic attacks,  borderline personality disorder  Past Medical History:  Past Medical History:  Diagnosis Date  . Anxiety    severe  . Asthma, chronic 12/27/2013  . Bipolar 2 disorder ([place])   . Essential hypertension, benign 12/27/2013  . Fibromyalgia 12/27/2013  . GERD (gastroesophageal reflux disease)   . Headache    migraines, over stimulation  . Leukocytosis 12/27/2013  . Low iron   . PCOS (polycystic ovarian syndrome)   . Perforation of left tympanic membrane 12/27/2013  . Poor dentition    right side upper and lower  . Restless leg syndrome   . Right leg pain   . Teeth clenching   . Teeth grinding   . Weakness    when exposed to hot or cold temperatures    Past Surgical History:  Procedure Laterality Date  . CHOLECYSTECTOMY    . ROBOTIC ASSISTED TOTAL HYSTERECTOMY WITH BILATERAL SALPINGO OOPHERECTOMY Bilateral 09/10/2015   Procedure: ROBOTIC ASSISTED TOTAL HYSTERECTOMY WITH BILATERAL SALPINGECTOMY;  Surgeon: Lavonia Drafts, MD;  Location: [facility];  Service: Gynecology;  Laterality: Bilateral;  . WISDOM TOOTH EXTRACTION      Family Psychiatric History: Dad- depression, Sister- depression  Family History:  Family History  Problem Relation Age of Onset  . Aneurysm Mother        brain  . Depression Father   . Heart disease Father   . Mitral valve prolapse Father   . Diabetes Father   . Hypertension Father   . Depression  Sister   . Breast cancer Paternal Grandfather   . Breast cancer Paternal Aunt     Social History:  Social History   Socioeconomic History  . Marital status: Married    Spouse name: Not on file  . Number of children: Not on file  . Years of education: Not on file  . Highest education level: Not on file  Occupational History  . Occupation: unemployed  Tobacco Use  . Smoking status: Current Every Day Smoker    Packs/day: 0.50    Years: 20.00    Pack years: 10.00    Types: Cigarettes  . Smokeless tobacco: Never Used  . Tobacco comment: uses 6mg  vapes   Substance and Sexual Activity  . Alcohol use: No    Alcohol/week: 0.0 standard drinks  . Drug use: No  . Sexual activity: Yes    Partners: Male    Birth control/protection: None  Other Topics Concern  . Not on file  Social History Narrative  . Not on file   Social Determinants of Health   Financial Resource Strain: Low Risk   . Difficulty of Paying Living Expenses: Not hard at all  Food Insecurity: Food Insecurity Present  . Worried About Charity fundraiser in the Last Year: Never true  . Ran Out of Food in the Last Year: Sometimes true  Transportation Needs: No Transportation Needs  . Lack of Transportation (Medical): No  . Lack of Transportation (Non-Medical): No  Physical Activity: Inactive  . Days of Exercise per Week: 0 days  . Minutes of Exercise per Session: 0 min  Stress: Stress Concern Present  . Feeling of Stress : Rather much  Social Connections: Moderately Isolated  . Frequency of Communication with Friends and Family: More than three times a week  . Frequency of Social Gatherings with Friends and Family: Once a week  . Attends Religious Services: Never  . Active Member of Clubs or Organizations: No  . Attends Archivist Meetings: Never  . Marital Status: Married    Allergies:  Allergies  Allergen Reactions  . Clarithromycin Nausea And Vomiting  . Adhesive [Tape] Other (See Comments)    Blisters, paper tape is worse  . Ambien [Zolpidem Tartrate]     Insomnia.  . Celecoxib Nausea And Vomiting  . Doxycycline     Double vision   . Erythromycin Nausea And Vomiting  . Lyrica [Pregabalin]     Induced lactation  . Morphine And Related     Decrease bp  . Penicillins Hives    Has patient had a PCN reaction causing immediate rash, facial/tongue/throat swelling, SOB or lightheadedness with hypotension: Yes Has patient had a PCN reaction causing severe rash involving mucus membranes or skin necrosis: No Has patient had a PCN reaction that required  hospitalization No Has patient had a PCN reaction occurring within the last 10 years: Yes If all of the above answers are "NO", then may proceed with Cephalosporin use.   . Latex Rash    Metabolic Disorder Labs: Lab Results  Component Value Date   HGBA1C 5.8 (H) 06/25/2015   MPG 120 (H) 06/25/2015   No results found for: PROLACTIN Lab Results  Component Value Date   CHOL 213 (H) 02/27/2015   TRIG 210 (H) 02/27/2015   HDL 34 (L) 02/27/2015   CHOLHDL 6.3 (H) 02/27/2015   VLDL 42 (H) 02/27/2015   LDLCALC 137 (H) 02/27/2015   LDLCALC 116 (H) 01/02/2014   Lab Results  Component Value Date  TSH 1.29 11/13/2016   TSH 1.748 02/27/2015    Therapeutic Level Labs: No results found for: LITHIUM No results found for: VALPROATE No components found for:  CBMZ  Current Medications: Current Outpatient Medications  Medication Sig Dispense Refill  . acetaminophen (TYLENOL) 500 MG tablet Take 500 mg by mouth every 6 (six) hours as needed for mild pain or moderate pain.    Marland Kitchen albuterol (VENTOLIN HFA) 108 (90 Base) MCG/ACT inhaler INHALE TWO PUFFS BY MOUTH EVERY 4 HOURS AS NEEDED FOR WHEEZING 18 g 3  . atenolol (TENORMIN) 25 MG tablet Take 1 tablet (25 mg total) by mouth 2 (two) times daily. 180 tablet 3  . busPIRone (BUSPAR) 10 MG tablet Take 1 tablet (10 mg total) by mouth 3 (three) times daily. 90 tablet 1  . estradiol (ESTRACE) 1 MG tablet Take 1 tablet (1 mg total) by mouth daily. 90 tablet 1  . furosemide (LASIX) 20 MG tablet Take 1 tablet (20 mg total) by mouth daily as needed for edema. 30 tablet 3  . gabapentin (NEURONTIN) 400 MG capsule Take 1 capsule (400 mg total) by mouth 2 (two) times daily. 60 capsule 1  . ipratropium-albuterol (DUONEB) 0.5-2.5 (3) MG/3ML SOLN Take 3 mLs by nebulization every 2 (two) hours as needed (wheeze, SOB). 60 mL 3  . MELATONIN PO Take by mouth.    . methocarbamol (ROBAXIN) 500 MG tablet TAKE ONE TABLET BY MOUTH THREE TIMES A DAY 90 tablet 2  .  mirtazapine (REMERON) 15 MG tablet Take 1 tablet (15 mg total) by mouth at bedtime. 30 tablet 1  . montelukast (SINGULAIR) 10 MG tablet TAKE ONE TABLET BY MOUTH AT BEDTIME 90 tablet 2  . Multiple Vitamin (MULTIVITAMIN) capsule Take 1 capsule by mouth daily.    . pantoprazole (PROTONIX) 40 MG tablet TAKE ONE TABLET BY MOUTH DAILY 90 tablet 2  . potassium chloride (KLOR-CON) 10 MEQ tablet Take 1 tablet (10 mEq total) by mouth 2 (two) times daily. 180 tablet 3  . predniSONE (DELTASONE) 50 MG tablet Take 1 tablet (50 mg total) by mouth daily. 5 tablet 0  . promethazine-dextromethorphan (PROMETHAZINE-DM) 6.25-15 MG/5ML syrup Take 5 mLs by mouth 4 (four) times daily as needed for cough. 118 mL 0  . traZODone (DESYREL) 50 MG tablet Take 1 tablet (50 mg total) by mouth at bedtime as needed for sleep. 30 tablet 1  . triamterene-hydrochlorothiazide (MAXZIDE-25) 37.5-25 MG tablet Take 1 tablet by mouth daily. 90 tablet 3  . triamterene-hydrochlorothiazide (MAXZIDE-25) 37.5-25 MG tablet Take 1 tablet by mouth daily. (Patient not taking: Reported on 03/08/2020) 90 tablet 1  . valACYclovir (VALTREX) 500 MG tablet TAKE ONE TABLET BY MOUTH DAILY 30 tablet 5   No current facility-administered medications for this visit.     Psychiatric Specialty Exam: Review of Systems  Last menstrual period 07/28/2015.There is no height or weight on file to calculate BMI.  General Appearance: Fairly Groomed  Eye Contact:  Good  Speech:  Clear and Coherent and Normal Rate  Volume:  Normal  Mood:  Slightly depressed  Affect:  Congruent  Thought Process:  Goal Directed and Descriptions of Associations: Intact  Orientation:  Full (Time, Place, and Person)  Thought Content: Logical   Suicidal Thoughts:  No  Homicidal Thoughts:  No  Memory:  Immediate;   Good Recent;   Good  Judgement:  Fair  Insight:  Fair  Psychomotor Activity:  Normal  Concentration:  Concentration: Good and Attention Span: Good  Recall:  Good  Fund  of Knowledge: Good  Language: Good  Akathisia:  Negative  Handed:  Right  AIMS (if indicated): not done  Assets:  Communication Skills Desire for Improvement Financial Resources/Insurance Housing  ADL's:  Intact  Cognition: WNL  Sleep:  Good   Screenings: GAD-7   Health and safety inspector from 01/18/2020 in [facility] Office Visit from 08/25/2019 in [facility] Office Visit from 04/26/2019 in [place] Office Visit from 02/23/2018 in [place] Visit from 02/07/2018 in [place]  Total GAD-7 Score 14 11 9 20 20     PHQ2-9   [place] Office Visit from 08/25/2019 in [place] Office Visit from 04/26/2019 in [place] Office Visit from 02/23/2018 in [place] Office Visit from 02/07/2018 in [place] Office Visit from 11/13/2016 in [place]  PHQ-2 Total Score 4 2 4 6 4   PHQ-9 Total Score 13 5 20 24 19        Assessment and Plan: Patient reported feeling slightly depressed due to the ongoing holiday season after the loss of her father earlier this year.  She does not think she needs to change any of her medicines and would like to keep the same regimen for now.  1. Bipolar 2 disorder, major depressive episode (HCC)  - mirtazapine (REMERON) 15 MG tablet; Take 1 tablet (15 mg total) by mouth at bedtime.  Dispense: 30 tablet; Refill: 2 - traZODone (DESYREL) 50 MG tablet; Take 1 tablet (50 mg total) by mouth at bedtime as needed for sleep.  Dispense: 30 tablet; Refill: 1 - gabapentin (NEURONTIN) 400 MG capsule; Take 1 capsule (400 mg total) by mouth 2 (two) times daily.  Dispense: 60 capsule; Refill: 1   2. PTSD (post-traumatic  stress disorder)  - mirtazapine (REMERON) 15 MG tablet; Take 1 tablet (15 mg total) by mouth at bedtime.  Dispense: 30 tablet; Refill: 2  3. Panic attacks  - mirtazapine (REMERON) 15 MG tablet; Take 1 tablet (15 mg total) by mouth at bedtime.  Dispense: 30 tablet; Refill: 2 - busPIRone (BUSPAR) 10 MG tablet; Take 1 tablet (10 mg total) by mouth 3 (three) times daily.  Dispense: 90 tablet; Refill: 1  Continue same meds. F/up in 2 months. Continue individual therapy with Ms. Jonelle Sports, MD 07/08/2020, 1:33 PM

## 2020-07-16 ENCOUNTER — Ambulatory Visit (INDEPENDENT_AMBULATORY_CARE_PROVIDER_SITE_OTHER): Payer: No Payment, Other | Admitting: Licensed Clinical Social Worker

## 2020-07-16 ENCOUNTER — Other Ambulatory Visit: Payer: Self-pay

## 2020-07-16 DIAGNOSIS — F3181 Bipolar II disorder: Secondary | ICD-10-CM

## 2020-07-16 DIAGNOSIS — F411 Generalized anxiety disorder: Secondary | ICD-10-CM

## 2020-07-16 NOTE — Progress Notes (Signed)
   THERAPIST PROGRESS NOTE  Virtual Visit via Video Note  I connected with Margaret Lawson on 07/16/20 at  1:00 PM EST by a video enabled telemedicine application and verified that I am speaking with the correct person using two identifiers.  Location: Patient: [facility]  Provider: [facility]    I discussed the limitations of evaluation and management by telemedicine and the availability of in person appointments. The patient expressed understanding and agreed to proceed.  Session Time: 66  Therapist Response:    Subjective/Objective:  Pt was alert and oriented x 5. She was dressed casually and engaged well with f/u therapy session. Pt presented today with depressed and tearful mood/affect. Margaret Lawson maintained good eye contact and was cooperative.   Primary stressor for pt is family conflict and grief/loss. Margaret Lawson started out session talking about her bird having a tumor, and how it has grown the past month per the Animal nutritionist. Pt started to become tearful stating "I am just sick of tired of grief". Pt stated she has lost her dad, 1 other bird, and her grandmother in the last 3 years. Margaret Lawson has been transitioning off her Prozac which she states has attributed to an increase in her symptoms.   The other stressor pt reports is family conflict. Pt was invited by her sister to her house. Margaret Lawson does not feel this is a good idea as she feels two depressed people in the same house for the holiday will not help each other. LCSW offered support grief therapy in today's session. Letting pt express herself with a tearful affect to release tension and hopelessness she is currently feeling. Margaret Lawson reports relief from intervention.    Assessment/Plan: Pt endorse symptoms for depression and anxiety as tension worry, irritability, worthlessness, hopelessness, and fatigue. She currently meets criteria for bipolar depression and GAD. She is taking all medications as prescribed. Pt  will continue to work on daily hygiene (shower 3 x per week), and continue to use planner for origination     I discussed the assessment and treatment plan with the patient. The patient was provided an opportunity to ask questions and all were answered. The patient agreed with the plan and demonstrated an understanding of the instructions.   The patient was advised to call back or seek an in-person evaluation if the symptoms worsen or if the condition fails to improve as anticipated.  I provided 45 minutes of non-face-to-face time during this encounter.   Dory Horn, LCSW   Participation Level: Active  Behavioral Response: CasualAlertDepressed, Hopeless and Worthless  Type of Therapy: Individual Therapy  Treatment Goals addressed: Anxiety  Interventions: CBT and Supportive  Summary: Margaret Lawson is a 39 y.o. female who presents with bipolar 2 and GAD.   Suicidal/Homicidal: NAwithout intent/plan    Plan: Return again in 4 weeks.   Dory Horn, LCSW 07/16/2020

## 2020-08-22 ENCOUNTER — Other Ambulatory Visit: Payer: Self-pay

## 2020-08-22 ENCOUNTER — Ambulatory Visit (INDEPENDENT_AMBULATORY_CARE_PROVIDER_SITE_OTHER): Payer: No Payment, Other | Admitting: Licensed Clinical Social Worker

## 2020-08-22 DIAGNOSIS — F4310 Post-traumatic stress disorder, unspecified: Secondary | ICD-10-CM | POA: Diagnosis not present

## 2020-08-22 DIAGNOSIS — F3181 Bipolar II disorder: Secondary | ICD-10-CM

## 2020-08-22 NOTE — Progress Notes (Signed)
   THERAPIST PROGRESS NOTE  Virtual Visit via Video Note  I connected with Margaret Lawson on 08/22/20 at 10:00 AM EST by a video enabled telemedicine application and verified that I am speaking with the correct person using two identifiers.  Location: Patient: [facility]  Provider: [facility]    I discussed the limitations of evaluation and management by telemedicine and the availability of in person appointments. The patient expressed understanding and agreed to proceed.  Session Time: 39  Therapist Response:    Subjective/Objective:  Pt was alert and oriented x 5. She was dressed casually and engaged well in therapy session. Pt presented today with a depressed mood/affect. Margaret Lawson was cooperative and maintained good eye contact.   Primary stressor today was trauma. Margaret Lawson reports that she had a bad flash back while watching a TV show about an older person being kept on a ventilator and the daughter have the choose to keep life support going. Pt reports stress, tension, re-experience trauma, guilt/shame, and tearfulness.   Intervention/Plan: LCSW utilized narrative therapy for today's session. Pt went through the story of her mother's death and in the same year her best friend died in a car accident. Pt plan moving forward write a letter to her mother and write 5 positive memories that Margaret Lawson can think of. Margaret Lawson will report back in 2 weeks with how the interventions worked or did not work.    Assessment/Plan: Pt endorses symptoms for PTSD listed above. Margaret Lawson does meet criteria for PTSD. She is taking all her medications as prescribed. Pt will f/u in two weeks.       I discussed the assessment and treatment plan with the patient. The patient was provided an opportunity to ask questions and all were answered. The patient agreed with the plan and demonstrated an understanding of the instructions.   The patient was advised to call back or seek an in-person  evaluation if the symptoms worsen or if the condition fails to improve as anticipated.  I provided 45 minutes of non-face-to-face time during this encounter.   Dory Horn, LCSW   Participation Level: Active  Behavioral Response: CasualAlertDepressed  Type of Therapy: Individual Therapy  Treatment Goals addressed: Diagnosis: bipolar 2 and PTSD   Interventions: CBT and Supportive  Summary: Margaret Lawson is a 40 y.o. female who presents with bipolar 2 and PTSD.   Suicidal/Homicidal: NAwithout intent/plan    Plan: Return again in 2 weeks.      Dory Horn, LCSW 08/22/2020

## 2020-09-05 ENCOUNTER — Ambulatory Visit (INDEPENDENT_AMBULATORY_CARE_PROVIDER_SITE_OTHER): Payer: No Payment, Other | Admitting: Licensed Clinical Social Worker

## 2020-09-05 ENCOUNTER — Other Ambulatory Visit: Payer: Self-pay

## 2020-09-05 DIAGNOSIS — F411 Generalized anxiety disorder: Secondary | ICD-10-CM

## 2020-09-05 DIAGNOSIS — F4310 Post-traumatic stress disorder, unspecified: Secondary | ICD-10-CM

## 2020-09-05 DIAGNOSIS — F3181 Bipolar II disorder: Secondary | ICD-10-CM

## 2020-09-05 NOTE — Progress Notes (Signed)
   THERAPIST PROGRESS NOTE  Session Time: 53  Therapist Response:      Subjective/Objective: Pt was alert and oriented x 5. She was dressed casually and engaged well in therapy session. Kamaya presented with tearful, depressed and anxious moo/affect. Lexis was cooperative and maintained good eye contact.   Pt started off session with her homework for grief/loss. She stated last time that she felt that this was needed to process through her mother's death. LCSW assigned pt 5 positive memories of her mother and to write her a letter explaining all the stuff she wishes she could say. Raigen stated in doing the memories she thought of something that connected to her father who passed away 2 years ago.  Her father use to cope with his spouse death by drinking. When he would drink, he would say thing to [place] like "Your time with your mother cost me time in the bedroom with her". Samyiah states she is resentful for those times, but never told her father this. Jenalyn began to become tearful in session showing sadness for the first time. This was an objective of pt sadness was evidence by her tearfulness.   Plan/Intervention: Primary intervention used was grief/loss therapy and supportive therapy. Pt will finish her homework of writing a letter to her mother to help gain closer. In next session the priority will switch to her father. Pt reports that she is walking 1 x weekly with the objective. Another objective was to decrease PHQ-9 and GAD-7 score below 10. This will be reassessed in next session.     Assessment: Pt endorse symptoms for tearfulness, insomnia, hopelessness, irritability, mood swings, re-experience traumatic event, guilt/shame, tension and worry. Currently pt meets criteria for GAD, bipolar 2 depressive type, and PTSD. She is taking all medications as prescribed. She stopped taking her trazadone, but experience too much insomnia. Samone will discuss this with her medication  provider 2/11  Participation Level: Active  Behavioral Response: Casual and Fairly GroomedAlertDepressed  Type of Therapy: Individual Therapy  Treatment Goals addressed: Diagnosis: depression   Interventions: CBT and Supportive  Summary: Lenah Messenger is a 40 y.o. female who presents with Bipolar 2 depression.   Suicidal/Homicidal: Nowithout intent/plan   Plan: Return again in 2 weeks.      Dory Horn, LCSW 09/05/2020

## 2020-09-06 ENCOUNTER — Encounter (HOSPITAL_COMMUNITY): Payer: Self-pay | Admitting: Psychiatry

## 2020-09-06 ENCOUNTER — Other Ambulatory Visit: Payer: Self-pay

## 2020-09-06 ENCOUNTER — Telehealth (INDEPENDENT_AMBULATORY_CARE_PROVIDER_SITE_OTHER): Payer: No Payment, Other | Admitting: Psychiatry

## 2020-09-06 DIAGNOSIS — F3181 Bipolar II disorder: Secondary | ICD-10-CM

## 2020-09-06 DIAGNOSIS — F4310 Post-traumatic stress disorder, unspecified: Secondary | ICD-10-CM | POA: Diagnosis not present

## 2020-09-06 DIAGNOSIS — F411 Generalized anxiety disorder: Secondary | ICD-10-CM | POA: Diagnosis not present

## 2020-09-06 DIAGNOSIS — F410 Panic disorder [episodic paroxysmal anxiety] without agoraphobia: Secondary | ICD-10-CM

## 2020-09-06 MED ORDER — MIRTAZAPINE 15 MG PO TABS: 15.0000 mg | ORAL_TABLET | Freq: Every day | ORAL | 1 refills | Status: DC

## 2020-09-06 MED ORDER — TRAZODONE HCL 50 MG PO TABS: 50.0000 mg | ORAL_TABLET | Freq: Every evening | ORAL | 1 refills | Status: DC | PRN

## 2020-09-06 MED ORDER — BUSPIRONE HCL 10 MG PO TABS: 10.0000 mg | ORAL_TABLET | Freq: Two times a day (BID) | ORAL | 1 refills | Status: DC

## 2020-09-06 MED ORDER — GABAPENTIN 400 MG PO CAPS: 400.0000 mg | ORAL_CAPSULE | Freq: Two times a day (BID) | ORAL | 1 refills | Status: DC

## 2020-09-06 NOTE — Progress Notes (Signed)
[place] MD/PA/NP OP Progress Note  Virtual Visit via Video Note  I connected with Margaret Lawson on 09/06/20 at 11:20 AM EST by a video enabled telemedicine application and verified that I am speaking with the correct person using two identifiers.  Location: Patient: Home Provider: Clinic   I discussed the limitations of evaluation and management by telemedicine and the availability of in person appointments. The patient expressed understanding and agreed to proceed.  I provided 17 minutes of non-face-to-face time during this encounter.     09/06/2020 11:18 AM Milia Warth  MRN:  [id number]  Chief Complaint:  " I am doing okay."  HPI: Patient found that she is doing okay for the most part.  She informed that she had a intense session of therapy with Adam yesterday and it was very emotional for her but she feels like it was helpful.  She stated that she tried discontinuing the trazodone a couple of nights ago however she could not sleep and was up pretty much the whole night.  She stated that when she did not sleep she realized that she was picking her skin again which used to be a habit for her in the past.  She stated that she does not want to go back to that habit so she has decided that she is going to continue taking the trazodone with the mirtazapine along with melatonin for sleep at night as this regimen is working well for her sleep. She has been taking gabapentin regularly along with BuSpar as well.  She informed that she takes BuSpar only twice a day because taking it 3 times a day was difficult with daily routine. She feels that she is on the right track for now and has concluded that she will have to live with some baseline anxiety and depression but overall things have been manageable. She denied any suicidal ideations or urge to engage in any self-injurious behaviors.   Visit Diagnosis:    ICD-10-CM   1. Bipolar 2 disorder, major depressive episode ([place])  F31.81   2.  PTSD (post-traumatic stress disorder)  F43.10   3. Generalized anxiety disorder  F41.1     Past Psychiatric History: Bipolar disorder, PTSD, panic attacks, borderline personality disorder  Past Medical History:  Past Medical History:  Diagnosis Date  . Anxiety    severe  . Asthma, chronic 12/27/2013  . Bipolar 2 disorder ([place])   . Essential hypertension, benign 12/27/2013  . Fibromyalgia 12/27/2013  . GERD (gastroesophageal reflux disease)   . Headache    migraines, over stimulation  . Leukocytosis 12/27/2013  . Low iron   . PCOS (polycystic ovarian syndrome)   . Perforation of left tympanic membrane 12/27/2013  . Poor dentition    right side upper and lower  . Restless leg syndrome   . Right leg pain   . Teeth clenching   . Teeth grinding   . Weakness    when exposed to hot or cold temperatures    Past Surgical History:  Procedure Laterality Date  . CHOLECYSTECTOMY    . ROBOTIC ASSISTED TOTAL HYSTERECTOMY WITH BILATERAL SALPINGO OOPHERECTOMY Bilateral 09/10/2015   Procedure: ROBOTIC ASSISTED TOTAL HYSTERECTOMY WITH BILATERAL SALPINGECTOMY;  Surgeon: Lavonia Drafts, MD;  Location: [facility];  Service: Gynecology;  Laterality: Bilateral;  . WISDOM TOOTH EXTRACTION      Family Psychiatric History: Dad- depression, Sister- depression  Family History:  Family History  Problem Relation Age of Onset  . Aneurysm Mother  brain  . Depression Father   . Heart disease Father   . Mitral valve prolapse Father   . Diabetes Father   . Hypertension Father   . Depression Sister   . Breast cancer Paternal Grandfather   . Breast cancer Paternal Aunt     Social History:  Social History   Socioeconomic History  . Marital status: Married    Spouse name: Not on file  . Number of children: Not on file  . Years of education: Not on file  . Highest education level: Not on file  Occupational History  . Occupation: unemployed  Tobacco Use  . Smoking status: Current Every Day  Smoker    Packs/day: 0.50    Years: 20.00    Pack years: 10.00    Types: Cigarettes  . Smokeless tobacco: Never Used  . Tobacco comment: uses 6mg  vapes  Substance and Sexual Activity  . Alcohol use: No    Alcohol/week: 0.0 standard drinks  . Drug use: No  . Sexual activity: Yes    Partners: Male    Birth control/protection: None  Other Topics Concern  . Not on file  Social History Narrative  . Not on file   Social Determinants of Health   Financial Resource Strain: Low Risk   . Difficulty of Paying Living Expenses: Not hard at all  Food Insecurity: Food Insecurity Present  . Worried About Charity fundraiser in the Last Year: Never true  . Ran Out of Food in the Last Year: Sometimes true  Transportation Needs: No Transportation Needs  . Lack of Transportation (Medical): No  . Lack of Transportation (Non-Medical): No  Physical Activity: Inactive  . Days of Exercise per Week: 0 days  . Minutes of Exercise per Session: 0 min  Stress: Stress Concern Present  . Feeling of Stress : Rather much  Social Connections: Moderately Isolated  . Frequency of Communication with Friends and Family: More than three times a week  . Frequency of Social Gatherings with Friends and Family: Once a week  . Attends Religious Services: Never  . Active Member of Clubs or Organizations: No  . Attends Archivist Meetings: Never  . Marital Status: Married    Allergies:  Allergies  Allergen Reactions  . Clarithromycin Nausea And Vomiting  . Adhesive [Tape] Other (See Comments)    Blisters, paper tape is worse  . Ambien [Zolpidem Tartrate]     Insomnia.  . Celecoxib Nausea And Vomiting  . Doxycycline     Double vision   . Erythromycin Nausea And Vomiting  . Lyrica [Pregabalin]     Induced lactation  . Morphine And Related     Decrease bp  . Penicillins Hives    Has patient had a PCN reaction causing immediate rash, facial/tongue/throat swelling, SOB or lightheadedness with  hypotension: Yes Has patient had a PCN reaction causing severe rash involving mucus membranes or skin necrosis: No Has patient had a PCN reaction that required hospitalization No Has patient had a PCN reaction occurring within the last 10 years: Yes If all of the above answers are "NO", then may proceed with Cephalosporin use.   . Latex Rash    Metabolic Disorder Labs: Lab Results  Component Value Date   HGBA1C 5.8 (H) 06/25/2015   MPG 120 (H) 06/25/2015   No results found for: PROLACTIN Lab Results  Component Value Date   CHOL 213 (H) 02/27/2015   TRIG 210 (H) 02/27/2015   HDL 34 (L) 02/27/2015  CHOLHDL 6.3 (H) 02/27/2015   VLDL 42 (H) 02/27/2015   LDLCALC 137 (H) 02/27/2015   LDLCALC 116 (H) 01/02/2014   Lab Results  Component Value Date   TSH 1.29 11/13/2016   TSH 1.748 02/27/2015    Therapeutic Level Labs: No results found for: LITHIUM No results found for: VALPROATE No components found for:  CBMZ  Current Medications: Current Outpatient Medications  Medication Sig Dispense Refill  . acetaminophen (TYLENOL) 500 MG tablet Take 500 mg by mouth every 6 (six) hours as needed for mild pain or moderate pain.    Marland Kitchen albuterol (VENTOLIN HFA) 108 (90 Base) MCG/ACT inhaler INHALE TWO PUFFS BY MOUTH EVERY 4 HOURS AS NEEDED FOR WHEEZING 18 g 3  . atenolol (TENORMIN) 25 MG tablet Take 1 tablet (25 mg total) by mouth 2 (two) times daily. 180 tablet 3  . busPIRone (BUSPAR) 10 MG tablet Take 1 tablet (10 mg total) by mouth 3 (three) times daily. 90 tablet 1  . estradiol (ESTRACE) 1 MG tablet Take 1 tablet (1 mg total) by mouth daily. 90 tablet 1  . furosemide (LASIX) 20 MG tablet Take 1 tablet (20 mg total) by mouth daily as needed for edema. 30 tablet 3  . gabapentin (NEURONTIN) 400 MG capsule Take 1 capsule (400 mg total) by mouth 2 (two) times daily. 60 capsule 1  . ipratropium-albuterol (DUONEB) 0.5-2.5 (3) MG/3ML SOLN Take 3 mLs by nebulization every 2 (two) hours as needed  (wheeze, SOB). 60 mL 3  . MELATONIN PO Take by mouth.    . methocarbamol (ROBAXIN) 500 MG tablet TAKE ONE TABLET BY MOUTH THREE TIMES A DAY 90 tablet 2  . mirtazapine (REMERON) 15 MG tablet Take 1 tablet (15 mg total) by mouth at bedtime. 30 tablet 1  . montelukast (SINGULAIR) 10 MG tablet TAKE ONE TABLET BY MOUTH AT BEDTIME 90 tablet 2  . Multiple Vitamin (MULTIVITAMIN) capsule Take 1 capsule by mouth daily.    . pantoprazole (PROTONIX) 40 MG tablet TAKE ONE TABLET BY MOUTH DAILY 90 tablet 2  . potassium chloride (KLOR-CON) 10 MEQ tablet Take 1 tablet (10 mEq total) by mouth 2 (two) times daily. 180 tablet 3  . traZODone (DESYREL) 50 MG tablet Take 1 tablet (50 mg total) by mouth at bedtime as needed for sleep. 30 tablet 1  . triamterene-hydrochlorothiazide (MAXZIDE-25) 37.5-25 MG tablet Take 1 tablet by mouth daily. 90 tablet 3  . triamterene-hydrochlorothiazide (MAXZIDE-25) 37.5-25 MG tablet Take 1 tablet by mouth daily. (Patient not taking: Reported on 03/08/2020) 90 tablet 1  . valACYclovir (VALTREX) 500 MG tablet TAKE ONE TABLET BY MOUTH DAILY 30 tablet 5   No current facility-administered medications for this visit.     Psychiatric Specialty Exam: Review of Systems  Last menstrual period 07/28/2015.There is no height or weight on file to calculate BMI.  General Appearance: Fairly Groomed  Eye Contact:  Good  Speech:  Clear and Coherent and Normal Rate  Volume:  Normal  Mood:  Euthymic  Affect:  Congruent  Thought Process:  Goal Directed and Descriptions of Associations: Intact  Orientation:  Full (Time, Place, and Person)  Thought Content: Logical   Suicidal Thoughts:  No  Homicidal Thoughts:  No  Memory:  Immediate;   Good Recent;   Good  Judgement:  Fair  Insight:  Fair  Psychomotor Activity:  Normal  Concentration:  Concentration: Good and Attention Span: Good  Recall:  Good  Fund of Knowledge: Good  Language: Good  Akathisia:  Negative  Handed:  Right  AIMS (if  indicated): not done  Assets:  Communication Skills Desire for Improvement Financial Resources/Insurance Housing  ADL's:  Intact  Cognition: WNL  Sleep:  Good   Screenings: GAD-7   Health and safety inspector from [facility]/18/2020 in [facility] Office Visit from 08/25/2019 in [place] Office Visit from 04/26/2019 in [place] Office Visit from 02/23/2018 in [place] Visit from 02/07/2018 in [place]  Total GAD-7 Score 14 11 9 20 20     PHQ2-9   [place] Office Visit from 08/25/2019 in [place] Office Visit from 04/26/2019 in [place] Office Visit from 02/23/2018 in [place] Office Visit from 02/07/2018 in [place] Office Visit from 11/13/2016 in [place]  PHQ-2 Total Score 4 2 4 6 4   PHQ-9 Total Score 13 5 20 24 19     Flowsheet Row Counselor from 08/22/2020 in [place] No Risk       Assessment and Plan: Patient reported that she is doing well fairly well on her regimen.  She was trying to see if she can manage without trazodone however had to restart taking it because she could not sleep when she stopped taking it. Overall she feels therapy is helping and she wants to continue the same regimen for now.  1. Bipolar 2 disorder, major depressive episode (H[facility])  - traZODone (DESYREL) 50 MG tablet; Take 1 tablet (50 mg total) by mouth at bedtime as needed for sleep.  Dispense: 30 tablet; Refill: 1 - mirtazapine (REMERON) 15 MG tablet; Take 1 tablet (15 mg total) by mouth at bedtime.  Dispense: 30 tablet; Refill: 1 - gabapentin (NEURONTIN) 400 MG capsule; Take 1 capsule (400  mg total) by mouth 2 (two) times daily.  Dispense: 60 capsule; Refill: 1  2. PTSD (post-traumatic stress disorder)  - Reduce busPIRone (BUSPAR) 10 MG tablet; Take 1 tablet (10 mg total) by mouth 2 (two) times daily.  Dispense: 60 tablet; Refill: 1 - mirtazapine (REMERON) 15 MG tablet; Take 1 tablet (15 mg total) by mouth at bedtime.  Dispense: 30 tablet; Refill: 1  3. Generalized anxiety disorder  - busPIRone (BUSPAR) 10 MG tablet; Take 1 tablet (10 mg total) by mouth 2 (two) times daily.  Dispense: 60 tablet; Refill: 1  4. Panic attacks  - mirtazapine (REMERON) 15 MG tablet; Take 1 tablet (15 mg total) by mouth at bedtime.  Dispense: 30 tablet; Refill: 1  F/up in 2 months. Continue individual therapy with Mr. Jonelle Sports, MD 09/06/2020, 11:18 AM

## 2020-09-10 ENCOUNTER — Other Ambulatory Visit: Payer: Self-pay | Admitting: Medical-Surgical

## 2020-09-10 NOTE — Telephone Encounter (Signed)
Routing to PCP

## 2020-09-19 ENCOUNTER — Ambulatory Visit (INDEPENDENT_AMBULATORY_CARE_PROVIDER_SITE_OTHER): Payer: No Payment, Other | Admitting: Licensed Clinical Social Worker

## 2020-09-19 ENCOUNTER — Other Ambulatory Visit: Payer: Self-pay

## 2020-09-19 DIAGNOSIS — F411 Generalized anxiety disorder: Secondary | ICD-10-CM

## 2020-09-19 DIAGNOSIS — F3181 Bipolar II disorder: Secondary | ICD-10-CM

## 2020-09-19 DIAGNOSIS — F4310 Post-traumatic stress disorder, unspecified: Secondary | ICD-10-CM

## 2020-09-19 NOTE — Progress Notes (Signed)
   THERAPIST PROGRESS NOTE  Session Time: 56  Virtual Visit via Video Note  I connected with [name] on 09/19/20 at  1:00 PM EST by a video enabled telemedicine application and verified that I am speaking with the correct person using two identifiers.  Location: Patient: [facility]  Provider: [facility]    I discussed the limitations of evaluation and management by telemedicine and the availability of in person appointments. The patient expressed understanding and agreed to proceed.  Therapist Response:   Subjective/Objective:  Pt was alert and oriented x 5. She was dressed casually and engaged well in therapy session. Darlynn presented with depressed mood/affect. She was cooperative and maintained good eye contact.   Primary stressor is grief/loss. Prior to this session LCSW and pt were working on unresolved grief with her mother and father. Today pt diverted from that topic as she found out she needs to put her pet bird down. This has caused pt increased depression. Ilissa reports she feels guilty about having to do this, but states it is the right thing to do.    Intervention/Plan: LCSW offered pt supportive therapy as primary intervention. Aslyn was unable to do her assignment of writing a letter to her mother for unresolved grief. LCSW let pt speak on the grief of her bird that she will be putting down on Monday. Elide reported relief in session going from a 5 to 7 on her depressive symptoms reported in session.    Assessment: Pt endorses symptoms for sadness, tearfulness, hopelessness, worthlessness, and insomnia. She also endorses symptoms for tension, worry, and restlessness. She does meet criteria for bipolar 2 depression and GAD. She is taking her medications as prescribed.     I discussed the assessment and treatment plan with the patient. The patient was provided an opportunity to ask questions and all were answered. The patient agreed with the  plan and demonstrated an understanding of the instructions.   The patient was advised to call back or seek an in-person evaluation if the symptoms worsen or if the condition fails to improve as anticipated.  I provided 45 minutes of non-face-to-face time during this encounter.   Dory Horn, LCSW   Participation Level: Active  Behavioral Response: Casual and Fairly GroomedAlertAnxious and Depressed  Type of Therapy: Individual Therapy  Treatment Goals addressed: Diagnosis: depression   Interventions: CBT and Supportive  Summary: Almarie Kurdziel is a 40 y.o. female who presents with Bipolar depression.   Suicidal/Homicidal: NAwithout intent/plan    Plan: Return again in 4 weeks.      Dory Horn, LCSW 09/19/2020

## 2020-10-15 ENCOUNTER — Ambulatory Visit (INDEPENDENT_AMBULATORY_CARE_PROVIDER_SITE_OTHER): Payer: No Payment, Other | Admitting: Licensed Clinical Social Worker

## 2020-10-15 ENCOUNTER — Other Ambulatory Visit: Payer: Self-pay

## 2020-10-15 DIAGNOSIS — F411 Generalized anxiety disorder: Secondary | ICD-10-CM

## 2020-10-15 DIAGNOSIS — F3181 Bipolar II disorder: Secondary | ICD-10-CM

## 2020-10-15 NOTE — Progress Notes (Signed)
   THERAPIST PROGRESS NOTE  Session Time: 37    Virtual Visit via Video Note  I connected with Margaret Lawson on 10/15/20 at  1:00 PM EDT by a video enabled telemedicine application and verified that I am speaking with the correct person using two identifiers.  Location: Patient: [facility]  Provider: [facility]    I discussed the limitations of evaluation and management by telemedicine and the availability of in person appointments. The patient expressed understanding and agreed to proceed.   Therapist Response:    Subjective/Objective:  Pt was alert and oriented x 5. She was dressed casually and engaged in therapy session. Pt presented with depressed, anxious, and tearful mood/affect. Annalissa was cooperative and maintained good eye contact.   Stressor for today's session is grief/loss. Pt had to put her bird down that She had for 1 year this past month, her brother in laws death anniversary is coming up, and pt is attempting to write a letter to her mother to gain closure. [name] states that she has unresolved issues with the death of her mother as this was a significant change in how the rest of her family viewed her moving forward. Pt turned into the mother figure for the family at only 68 years old.    Plan: To continue to write letter to mother, pt will also describe detail so her mother death the day that it happened. Pt will meet with LCSW every 3 weeks moving forward.   Assessment: Pt endorse symptoms for sadness, tearfulness, anger, mood swings, irritability, insomnia, and night terrors. She does meet criteria for bipolar 2 currently depressive and is taking medications as prescribed.    I discussed the assessment and treatment plan with the patient. The patient was provided an opportunity to ask questions and all were answered. The patient agreed with the plan and demonstrated an understanding of the instructions.   The patient was advised to call back or  seek an in-person evaluation if the symptoms worsen or if the condition fails to improve as anticipated.  I provided 45 minutes of non-face-to-face time during this encounter.   Dory Horn, LCSW  Participation Level: Active  Behavioral Response: CasualAlertDepressed  Type of Therapy: Individual Therapy  Treatment Goals addressed: Diagnosis: depression   Interventions: CBT and Supportive  Summary: Margaret Lawson is a 40 y.o. female who presents with bipolar depression.   Suicidal/Homicidal: NAwithout intent/plan   Plan: Return again in 3 weeks.      Dory Horn, LCSW 10/15/2020

## 2020-10-20 ENCOUNTER — Other Ambulatory Visit: Payer: Self-pay | Admitting: Physician Assistant

## 2020-11-01 ENCOUNTER — Telehealth (INDEPENDENT_AMBULATORY_CARE_PROVIDER_SITE_OTHER): Payer: No Payment, Other | Admitting: Psychiatry

## 2020-11-01 ENCOUNTER — Encounter (HOSPITAL_COMMUNITY): Payer: Self-pay | Admitting: Psychiatry

## 2020-11-01 ENCOUNTER — Other Ambulatory Visit: Payer: Self-pay

## 2020-11-01 DIAGNOSIS — F4310 Post-traumatic stress disorder, unspecified: Secondary | ICD-10-CM | POA: Diagnosis not present

## 2020-11-01 DIAGNOSIS — F3181 Bipolar II disorder: Secondary | ICD-10-CM

## 2020-11-01 DIAGNOSIS — F410 Panic disorder [episodic paroxysmal anxiety] without agoraphobia: Secondary | ICD-10-CM | POA: Diagnosis not present

## 2020-11-01 DIAGNOSIS — F411 Generalized anxiety disorder: Secondary | ICD-10-CM

## 2020-11-01 MED ORDER — SERTRALINE HCL 50 MG PO TABS: 50.0000 mg | ORAL_TABLET | Freq: Every day | ORAL | 1 refills | Status: DC

## 2020-11-01 MED ORDER — TRAZODONE HCL 50 MG PO TABS: 50.0000 mg | ORAL_TABLET | Freq: Every evening | ORAL | 1 refills | Status: DC | PRN

## 2020-11-01 MED ORDER — BUSPIRONE HCL 10 MG PO TABS: 10.0000 mg | ORAL_TABLET | Freq: Three times a day (TID) | ORAL | 1 refills | Status: DC

## 2020-11-01 MED ORDER — GABAPENTIN 400 MG PO CAPS: 400.0000 mg | ORAL_CAPSULE | Freq: Two times a day (BID) | ORAL | 1 refills | Status: DC

## 2020-11-01 MED ORDER — MIRTAZAPINE 15 MG PO TABS: 15.0000 mg | ORAL_TABLET | Freq: Every day | ORAL | 1 refills | Status: DC

## 2020-11-01 NOTE — Progress Notes (Signed)
[facility] MD/PA/NP OP Progress Note   Virtual Visit via Video Note  I connected with Margaret Lawson on 11/01/20 at 11:20 AM EDT by a video enabled telemedicine application and verified that I am speaking with the correct person using two identifiers.  Location: Patient: Home Provider: Clinic   I discussed the limitations of evaluation and management by telemedicine and the availability of in person appointments. The patient expressed understanding and agreed to proceed.  I provided 18 minutes of non-face-to-face time during this encounter.    11/01/2020 11:18 AM Margaret Lawson  MRN:  [id number]  Chief Complaint:  "I have been having some issues."  HPI: Patient reported that lately she has noticed that she started picking on her skin repeatedly.  She stated that she will pick on her cuticles and nails repeatedly resulting in bleeding from her nails and same thing with her lips. She also stated that she gets very irritated when things are moving around her house.  She stated that her young niece and nephew visit them quite frequently and whenever they are here in the move everything around the house she has a hard time controlling herself.  She also has started to be very particular about certain things and has recurring intrusive thoughts about things that happened in the past. She stated that she is not engaged in frequent handwashing or checking but finds it hard to control her thoughts at times. She asked if she can be tried on something to help with these intrusive thoughts.  She stated that she feels that she has OCD tendencies.  She also mentioned that currently she is working on the circumstances around her mother's death during therapy sessions and that has kind of brought up a lot of stress and anxiety in her.  She also asked if she can go up on the dose of buspirone to 10 mg 3 times a day as the dose has been reduced to twice a day in the last couple of months. Writer recommended  trial of addition of sertraline to target her intrusive thoughts and informed her that it also helps with the skin picking issues. Potential side effects of medication and risks vs benefits of treatment vs non-treatment were explained and discussed. All questions were answered. Patient is agreeable to try sertraline.  She stated that she would like to continue taking mirtazapine as mirtazapine in combination with trazodone and over-the-counter melatonin help her sleep well at night.   Visit Diagnosis:    ICD-10-CM   1. Bipolar 2 disorder, major depressive episode ([place])  F31.81   2. PTSD (post-traumatic stress disorder)  F43.10   3. Generalized anxiety disorder  F41.1   4. Panic attacks  F41.0     Past Psychiatric History: Bipolar disorder, PTSD, panic attacks, borderline personality disorder  Past Medical History:  Past Medical History:  Diagnosis Date  . Anxiety    severe  . Asthma, chronic 12/27/2013  . Bipolar 2 disorder ([place])   . Essential hypertension, benign 12/27/2013  . Fibromyalgia 12/27/2013  . GERD (gastroesophageal reflux disease)   . Headache    migraines, over stimulation  . Leukocytosis 12/27/2013  . Low iron   . PCOS (polycystic ovarian syndrome)   . Perforation of left tympanic membrane 12/27/2013  . Poor dentition    right side upper and lower  . Restless leg syndrome   . Right leg pain   . Teeth clenching   . Teeth grinding   . Weakness    when exposed  to hot or cold temperatures    Past Surgical History:  Procedure Laterality Date  . CHOLECYSTECTOMY    . ROBOTIC ASSISTED TOTAL HYSTERECTOMY WITH BILATERAL SALPINGO OOPHERECTOMY Bilateral 09/10/2015   Procedure: ROBOTIC ASSISTED TOTAL HYSTERECTOMY WITH BILATERAL SALPINGECTOMY;  Surgeon: Lavonia Drafts, MD;  Location: [facility];  Service: Gynecology;  Laterality: Bilateral;  . WISDOM TOOTH EXTRACTION      Family Psychiatric History: Dad- depression, Sister- depression  Family History:  Family History   Problem Relation Age of Onset  . Aneurysm Mother        brain  . Depression Father   . Heart disease Father   . Mitral valve prolapse Father   . Diabetes Father   . Hypertension Father   . Depression Sister   . Breast cancer Paternal Grandfather   . Breast cancer Paternal Aunt     Social History:  Social History   Socioeconomic History  . Marital status: Married    Spouse name: Not on file  . Number of children: Not on file  . Years of education: Not on file  . Highest education level: Not on file  Occupational History  . Occupation: unemployed  Tobacco Use  . Smoking status: Current Every Day Smoker    Packs/day: 0.50    Years: 20.00    Pack years: 10.00    Types: Cigarettes  . Smokeless tobacco: Never Used  . Tobacco comment: uses 6mg  vapes  Substance and Sexual Activity  . Alcohol use: No    Alcohol/week: 0.0 standard drinks  . Drug use: No  . Sexual activity: Yes    Partners: Male    Birth control/protection: None  Other Topics Concern  . Not on file  Social History Narrative  . Not on file   Social Determinants of Health   Financial Resource Strain: Low Risk   . Difficulty of Paying Living Expenses: Not hard at all  Food Insecurity: Food Insecurity Present  . Worried About Charity fundraiser in the Last Year: Never true  . Ran Out of Food in the Last Year: Sometimes true  Transportation Needs: No Transportation Needs  . Lack of Transportation (Medical): No  . Lack of Transportation (Non-Medical): No  Physical Activity: Inactive  . Days of Exercise per Week: 0 days  . Minutes of Exercise per Session: 0 min  Stress: Stress Concern Present  . Feeling of Stress : Rather much  Social Connections: Moderately Isolated  . Frequency of Communication with Friends and Family: More than three times a week  . Frequency of Social Gatherings with Friends and Family: Once a week  . Attends Religious Services: Never  . Active Member of Clubs or Organizations: No   . Attends Archivist Meetings: Never  . Marital Status: Married    Allergies:  Allergies  Allergen Reactions  . Clarithromycin Nausea And Vomiting  . Adhesive [Tape] Other (See Comments)    Blisters, paper tape is worse  . Ambien [Zolpidem Tartrate]     Insomnia.  . Celecoxib Nausea And Vomiting  . Doxycycline     Double vision   . Erythromycin Nausea And Vomiting  . Lyrica [Pregabalin]     Induced lactation  . Morphine And Related     Decrease bp  . Penicillins Hives    Has patient had a PCN reaction causing immediate rash, facial/tongue/throat swelling, SOB or lightheadedness with hypotension: Yes Has patient had a PCN reaction causing severe rash involving mucus membranes or skin necrosis: No  Has patient had a PCN reaction that required hospitalization No Has patient had a PCN reaction occurring within the last 10 years: Yes If all of the above answers are "NO", then may proceed with Cephalosporin use.   . Latex Rash    Metabolic Disorder Labs: Lab Results  Component Value Date   HGBA1C 5.8 (H) 06/25/2015   MPG 120 (H) 06/25/2015   No results found for: PROLACTIN Lab Results  Component Value Date   CHOL 213 (H) 02/27/2015   TRIG 210 (H) 02/27/2015   HDL 34 (L) 02/27/2015   CHOLHDL 6.3 (H) 02/27/2015   VLDL 42 (H) 02/27/2015   LDLCALC 137 (H) 02/27/2015   LDLCALC 116 (H) 01/02/2014   Lab Results  Component Value Date   TSH 1.29 11/13/2016   TSH 1.748 02/27/2015    Therapeutic Level Labs: No results found for: LITHIUM No results found for: VALPROATE No components found for:  CBMZ  Current Medications: Current Outpatient Medications  Medication Sig Dispense Refill  . acetaminophen (TYLENOL) 500 MG tablet Take 500 mg by mouth every 6 (six) hours as needed for mild pain or moderate pain.    Marland Kitchen albuterol (VENTOLIN HFA) 108 (90 Base) MCG/ACT inhaler INHALE TWO PUFFS BY MOUTH EVERY 4 HOURS AS NEEDED FOR WHEEZING 18 g 3  . atenolol (TENORMIN) 25 MG  tablet Take 1 tablet (25 mg total) by mouth 2 (two) times daily. 180 tablet 3  . busPIRone (BUSPAR) 10 MG tablet Take 1 tablet (10 mg total) by mouth 2 (two) times daily. 60 tablet 1  . estradiol (ESTRACE) 1 MG tablet TAKE ONE TABLET BY MOUTH DAILY 90 tablet 0  . furosemide (LASIX) 20 MG tablet Take 1 tablet (20 mg total) by mouth daily as needed for edema. 30 tablet 3  . gabapentin (NEURONTIN) 400 MG capsule Take 1 capsule (400 mg total) by mouth 2 (two) times daily. 60 capsule 1  . ipratropium-albuterol (DUONEB) 0.5-2.5 (3) MG/3ML SOLN Take 3 mLs by nebulization every 2 (two) hours as needed (wheeze, SOB). 60 mL 3  . MELATONIN PO Take by mouth.    . methocarbamol (ROBAXIN) 500 MG tablet TAKE ONE TABLET BY MOUTH THREE TIMES A DAY 90 tablet 2  . mirtazapine (REMERON) 15 MG tablet Take 1 tablet (15 mg total) by mouth at bedtime. 30 tablet 1  . montelukast (SINGULAIR) 10 MG tablet TAKE ONE TABLET BY MOUTH AT BEDTIME 90 tablet 2  . Multiple Vitamin (MULTIVITAMIN) capsule Take 1 capsule by mouth daily.    . pantoprazole (PROTONIX) 40 MG tablet TAKE ONE TABLET BY MOUTH DAILY 90 tablet 2  . potassium chloride (KLOR-CON) 10 MEQ tablet Take 1 tablet (10 mEq total) by mouth 2 (two) times daily. 180 tablet 3  . traZODone (DESYREL) 50 MG tablet Take 1 tablet (50 mg total) by mouth at bedtime as needed for sleep. 30 tablet 1  . triamterene-hydrochlorothiazide (MAXZIDE-25) 37.5-25 MG tablet Take 1 tablet by mouth daily. 90 tablet 3  . triamterene-hydrochlorothiazide (MAXZIDE-25) 37.5-25 MG tablet Take 1 tablet by mouth daily. (Patient not taking: Reported on 03/08/2020) 90 tablet 1  . valACYclovir (VALTREX) 500 MG tablet TAKE ONE TABLET BY MOUTH DAILY 30 tablet 5   No current facility-administered medications for this visit.     Psychiatric Specialty Exam: Review of Systems  Last menstrual period 07/28/2015.There is no height or weight on file to calculate BMI.  General Appearance: Fairly Groomed  Eye  Contact:  Good  Speech:  Clear and Coherent  and Normal Rate  Volume:  Normal  Mood:  Anxious  Affect:  Congruent  Thought Process:  Goal Directed and Descriptions of Associations: Intact  Orientation:  Full (Time, Place, and Person)  Thought Content: Logical   Suicidal Thoughts:  No  Homicidal Thoughts:  No  Memory:  Immediate;   Good Recent;   Good  Judgement:  Fair  Insight:  Fair  Psychomotor Activity:  Normal  Concentration:  Concentration: Good and Attention Span: Good  Recall:  Good  Fund of Knowledge: Good  Language: Good  Akathisia:  Negative  Handed:  Right  AIMS (if indicated): not done  Assets:  Communication Skills Desire for Improvement Financial Resources/Insurance Housing  ADL's:  Intact  Cognition: WNL  Sleep:  Good   Screenings: GAD-7   Health and safety inspector from 01/18/2020 in [facility] Office Visit from 08/25/2019 in [facility] Office Visit from 04/26/2019 in [place] Office Visit from 02/23/2018 in [place] Visit from 02/07/2018 in [place]  Total GAD-7 Score 14 11 9 20 20     PHQ2-9   [place] Office Visit from 08/25/2019 in [place] Office Visit from 04/26/2019 in [place] Office Visit from 02/23/2018 in [facility] Office Visit from 02/07/2018 in [place] Office Visit from 11/13/2016 in [place]  PHQ-2 Total Score 4 2 4 6 4   PHQ-9 Total Score 13 5 20 24 19     Flowsheet Row Counselor from 08/22/2020 in [facility] No Risk       Assessment and Plan: Patient reported increased intrusive thoughts including skin picking issues.  She  is not sure if she has full-blown OCD symptoms but is concerned about these obsessive intrusive thoughts.  She is agreeable to addition of sertraline to target these issues.  She also requested an increase in the dose of buspirone back to 10 mg 3 times a day. Potential side effects of medication and risks vs benefits of treatment vs non-treatment were explained and discussed. All questions were answered.   1. Bipolar 2 disorder, major depressive episode (H[facility])  - gabapentin (NEURONTIN) 400 MG capsule; Take 1 capsule (400 mg total) by mouth 2 (two) times daily.  Dispense: 60 capsule; Refill: 1 - mirtazapine (REMERON) 15 MG tablet; Take 1 tablet (15 mg total) by mouth at bedtime.  Dispense: 30 tablet; Refill: 1 - traZODone (DESYREL) 50 MG tablet; Take 1 tablet (50 mg total) by mouth at bedtime as needed for sleep.  Dispense: 30 tablet; Refill: 1 - sertraline (ZOLOFT) 50 MG tablet; Take 1 tablet (50 mg total) by mouth daily with breakfast.  Dispense: 30 tablet; Refill: 1  2. PTSD (post-traumatic stress disorder)  - busPIRone (BUSPAR) 10 MG tablet; Take 1 tablet (10 mg total) by mouth 3 (three) times daily.  Dispense: 90 tablet; Refill: 1 - mirtazapine (REMERON) 15 MG tablet; Take 1 tablet (15 mg total) by mouth at bedtime.  Dispense: 30 tablet; Refill: 1  3. Generalized anxiety disorder  - Increase busPIRone (BUSPAR) 10 MG tablet; Take 1 tablet (10 mg total) by mouth 3 (three) times daily.  Dispense: 90 tablet; Refill: 1 - Start sertraline (ZOLOFT) 50 MG tablet; Take 1 tablet (  50 mg total) by mouth daily with breakfast.  Dispense: 30 tablet; Refill: 1  4. Panic attacks  - mirtazapine (REMERON) 15 MG tablet; Take 1 tablet (15 mg total) by mouth at bedtime.  Dispense: 30 tablet; Refill: 1   F/up in 6 weeks. Continue individual therapy with Mr. Jonelle Sports, MD 11/01/2020, 11:18 AM

## 2020-11-05 ENCOUNTER — Other Ambulatory Visit: Payer: Self-pay

## 2020-11-05 ENCOUNTER — Ambulatory Visit (INDEPENDENT_AMBULATORY_CARE_PROVIDER_SITE_OTHER): Payer: No Payment, Other | Admitting: Licensed Clinical Social Worker

## 2020-11-05 DIAGNOSIS — F411 Generalized anxiety disorder: Secondary | ICD-10-CM

## 2020-11-05 DIAGNOSIS — F3181 Bipolar II disorder: Secondary | ICD-10-CM | POA: Diagnosis not present
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
## 2020-11-24 ENCOUNTER — Other Ambulatory Visit: Payer: Self-pay | Admitting: Physician Assistant

## 2020-11-24 DIAGNOSIS — M797 Fibromyalgia: Secondary | ICD-10-CM

## 2020-11-25 ENCOUNTER — Other Ambulatory Visit: Payer: Self-pay

## 2020-11-25 ENCOUNTER — Ambulatory Visit (HOSPITAL_COMMUNITY): Payer: No Payment, Other | Admitting: Licensed Clinical Social Worker

## 2020-11-25 ENCOUNTER — Telehealth (HOSPITAL_COMMUNITY): Payer: Self-pay | Admitting: Licensed Clinical Social Worker

## 2020-11-25 NOTE — Telephone Encounter (Signed)
Pt was not feeling well and asked to not be seen today. She was rescheduled for July 7th 1pm

## 2020-12-13 ENCOUNTER — Encounter (HOSPITAL_COMMUNITY): Payer: Self-pay | Admitting: Psychiatry

## 2020-12-13 ENCOUNTER — Telehealth (INDEPENDENT_AMBULATORY_CARE_PROVIDER_SITE_OTHER): Payer: No Payment, Other | Admitting: Psychiatry

## 2020-12-13 ENCOUNTER — Other Ambulatory Visit: Payer: Self-pay

## 2020-12-13 DIAGNOSIS — F410 Panic disorder [episodic paroxysmal anxiety] without agoraphobia: Secondary | ICD-10-CM | POA: Diagnosis not present

## 2020-12-13 DIAGNOSIS — F4310 Post-traumatic stress disorder, unspecified: Secondary | ICD-10-CM

## 2020-12-13 DIAGNOSIS — F3181 Bipolar II disorder: Secondary | ICD-10-CM | POA: Diagnosis not present

## 2020-12-13 DIAGNOSIS — F411 Generalized anxiety disorder: Secondary | ICD-10-CM | POA: Diagnosis not present

## 2020-12-13 MED ORDER — SERTRALINE HCL 100 MG PO TABS: 100.0000 mg | ORAL_TABLET | Freq: Every day | ORAL | 1 refills | Status: DC

## 2020-12-13 MED ORDER — GABAPENTIN 400 MG PO CAPS: 400.0000 mg | ORAL_CAPSULE | Freq: Two times a day (BID) | ORAL | 1 refills | Status: DC

## 2020-12-13 MED ORDER — TRAZODONE HCL 50 MG PO TABS: 50.0000 mg | ORAL_TABLET | Freq: Every evening | ORAL | 1 refills | Status: DC | PRN

## 2020-12-13 MED ORDER — BUSPIRONE HCL 10 MG PO TABS: 10.0000 mg | ORAL_TABLET | Freq: Three times a day (TID) | ORAL | 1 refills | Status: DC

## 2020-12-13 MED ORDER — MIRTAZAPINE 15 MG PO TABS: 15.0000 mg | ORAL_TABLET | Freq: Every day | ORAL | 1 refills | Status: DC

## 2020-12-13 NOTE — Progress Notes (Signed)
[facility] MD/PA/NP OP Progress Note   Virtual Visit via Video Note  I connected with Margaret Lawson on 12/13/20 at 10:40 AM EDT by a video enabled telemedicine application and verified that I am speaking with the correct person using two identifiers.  Location: Patient: Home Provider: Clinic   I discussed the limitations of evaluation and management by telemedicine and the availability of in person appointments. The patient expressed understanding and agreed to proceed.  I provided 16 minutes of non-face-to-face time during this encounter.    12/13/2020 10:27 AM Margaret Lawson  MRN:  [id number]  Chief Complaint:  "I think Sertraline has helped some."   HPI: Patient reported that she feels that sertraline helped with her anxiety and intrusive thoughts and also with her skin picking habit significantly for the first few weeks however for the past few days she thinks she is kind of back to picking on her skin again but is not as bad. She stated that her anxiety has subsided to some extent and the intrusive thoughts are also improved. She stated that she is about to come off of her estrogen after 2 days and she will just be taking black cohosh for menopausal symptoms.  She stated that she is anticipating increase in her anxiety after she gets off of estrogen over the next few days. She stated that overall things are slightly better but she is not sure if there needs to be any adjustments.  Writer recommended adjusting the dose of sertraline to 100 mg to see if that would benefit her more especially as we anticipate increasing her anxiety after she discontinues estrogen. Patient stated that she agrees with this suggestion.  She has been seeing her therapist regularly and finds that to be helpful.  She asked if her regimen seems to be okay and Probation officer recommended continuing everything the same way except for the adjustment in the dose of sertraline.   Visit Diagnosis:    ICD-10-CM   1.  Bipolar 2 disorder, major depressive episode ([place])  F31.81   2. Generalized anxiety disorder  F41.1   3. PTSD (post-traumatic stress disorder)  F43.10   4. Panic attacks  F41.0     Past Psychiatric History: Bipolar disorder, PTSD, panic attacks, borderline personality disorder  Past Medical History:  Past Medical History:  Diagnosis Date  . Anxiety    severe  . Asthma, chronic 12/27/2013  . Bipolar 2 disorder ([place])   . Essential hypertension, benign 12/27/2013  . Fibromyalgia 12/27/2013  . GERD (gastroesophageal reflux disease)   . Headache    migraines, over stimulation  . Leukocytosis 12/27/2013  . Low iron   . PCOS (polycystic ovarian syndrome)   . Perforation of left tympanic membrane 12/27/2013  . Poor dentition    right side upper and lower  . Restless leg syndrome   . Right leg pain   . Teeth clenching   . Teeth grinding   . Weakness    when exposed to hot or cold temperatures    Past Surgical History:  Procedure Laterality Date  . CHOLECYSTECTOMY    . ROBOTIC ASSISTED TOTAL HYSTERECTOMY WITH BILATERAL SALPINGO OOPHERECTOMY Bilateral 09/10/2015   Procedure: ROBOTIC ASSISTED TOTAL HYSTERECTOMY WITH BILATERAL SALPINGECTOMY;  Surgeon: Lavonia Drafts, MD;  Location: [facility];  Service: Gynecology;  Laterality: Bilateral;  . WISDOM TOOTH EXTRACTION      Family Psychiatric History: Dad- depression, Sister- depression  Family History:  Family History  Problem Relation Age of Onset  . Aneurysm Mother  brain  . Depression Father   . Heart disease Father   . Mitral valve prolapse Father   . Diabetes Father   . Hypertension Father   . Depression Sister   . Breast cancer Paternal Grandfather   . Breast cancer Paternal Aunt     Social History:  Social History   Socioeconomic History  . Marital status: Married    Spouse name: Not on file  . Number of children: Not on file  . Years of education: Not on file  . Highest education level: Not on file   Occupational History  . Occupation: unemployed  Tobacco Use  . Smoking status: Current Every Day Smoker    Packs/day: 0.50    Years: 20.00    Pack years: 10.00    Types: Cigarettes  . Smokeless tobacco: Never Used  . Tobacco comment: uses 6mg  vapes  Substance and Sexual Activity  . Alcohol use: No    Alcohol/week: 0.0 standard drinks  . Drug use: No  . Sexual activity: Yes    Partners: Male    Birth control/protection: None  Other Topics Concern  . Not on file  Social History Narrative  . Not on file   Social Determinants of Health   Financial Resource Strain: Low Risk   . Difficulty of Paying Living Expenses: Not hard at all  Food Insecurity: Food Insecurity Present  . Worried About Charity fundraiser in the Last Year: Never true  . Ran Out of Food in the Last Year: Sometimes true  Transportation Needs: No Transportation Needs  . Lack of Transportation (Medical): No  . Lack of Transportation (Non-Medical): No  Physical Activity: Inactive  . Days of Exercise per Week: 0 days  . Minutes of Exercise per Session: 0 min  Stress: Stress Concern Present  . Feeling of Stress : Rather much  Social Connections: Moderately Isolated  . Frequency of Communication with Friends and Family: More than three times a week  . Frequency of Social Gatherings with Friends and Family: Once a week  . Attends Religious Services: Never  . Active Member of Clubs or Organizations: No  . Attends Archivist Meetings: Never  . Marital Status: Married    Allergies:  Allergies  Allergen Reactions  . Clarithromycin Nausea And Vomiting  . Adhesive [Tape] Other (See Comments)    Blisters, paper tape is worse  . Ambien [Zolpidem Tartrate]     Insomnia.  . Celecoxib Nausea And Vomiting  . Doxycycline     Double vision   . Erythromycin Nausea And Vomiting  . Lyrica [Pregabalin]     Induced lactation  . Morphine And Related     Decrease bp  . Penicillins Hives    Has patient had a  PCN reaction causing immediate rash, facial/tongue/throat swelling, SOB or lightheadedness with hypotension: Yes Has patient had a PCN reaction causing severe rash involving mucus membranes or skin necrosis: No Has patient had a PCN reaction that required hospitalization No Has patient had a PCN reaction occurring within the last 10 years: Yes If all of the above answers are "NO", then may proceed with Cephalosporin use.   . Latex Rash    Metabolic Disorder Labs: Lab Results  Component Value Date   HGBA1C 5.8 (H) 06/25/2015   MPG 120 (H) 06/25/2015   No results found for: PROLACTIN Lab Results  Component Value Date   CHOL 213 (H) 02/27/2015   TRIG 210 (H) 02/27/2015   HDL 34 (L) 02/27/2015  CHOLHDL 6.3 (H) 02/27/2015   VLDL 42 (H) 02/27/2015   LDLCALC 137 (H) 02/27/2015   LDLCALC 116 (H) 01/02/2014   Lab Results  Component Value Date   TSH 1.29 11/13/2016   TSH 1.748 02/27/2015    Therapeutic Level Labs: No results found for: LITHIUM No results found for: VALPROATE No components found for:  CBMZ  Current Medications: Current Outpatient Medications  Medication Sig Dispense Refill  . acetaminophen (TYLENOL) 500 MG tablet Take 500 mg by mouth every 6 (six) hours as needed for mild pain or moderate pain.    Marland Kitchen albuterol (VENTOLIN HFA) 108 (90 Base) MCG/ACT inhaler INHALE TWO PUFFS BY MOUTH EVERY 4 HOURS AS NEEDED FOR WHEEZING 18 g 3  . atenolol (TENORMIN) 25 MG tablet Take 1 tablet (25 mg total) by mouth 2 (two) times daily. 180 tablet 3  . busPIRone (BUSPAR) 10 MG tablet Take 1 tablet (10 mg total) by mouth 3 (three) times daily. 90 tablet 1  . estradiol (ESTRACE) 1 MG tablet TAKE ONE TABLET BY MOUTH DAILY 90 tablet 0  . furosemide (LASIX) 20 MG tablet Take 1 tablet (20 mg total) by mouth daily as needed for edema. 30 tablet 3  . gabapentin (NEURONTIN) 400 MG capsule Take 1 capsule (400 mg total) by mouth 2 (two) times daily. 60 capsule 1  . ipratropium-albuterol (DUONEB)  0.5-2.5 (3) MG/3ML SOLN Take 3 mLs by nebulization every 2 (two) hours as needed (wheeze, SOB). 60 mL 3  . MELATONIN PO Take by mouth.    . methocarbamol (ROBAXIN) 500 MG tablet TAKE ONE TABLET BY MOUTH THREE TIMES A DAY 90 tablet 2  . mirtazapine (REMERON) 15 MG tablet Take 1 tablet (15 mg total) by mouth at bedtime. 30 tablet 1  . montelukast (SINGULAIR) 10 MG tablet TAKE ONE TABLET BY MOUTH AT BEDTIME 90 tablet 2  . Multiple Vitamin (MULTIVITAMIN) capsule Take 1 capsule by mouth daily.    . pantoprazole (PROTONIX) 40 MG tablet TAKE ONE TABLET BY MOUTH DAILY 90 tablet 2  . potassium chloride (KLOR-CON) 10 MEQ tablet Take 1 tablet (10 mEq total) by mouth 2 (two) times daily. 180 tablet 3  . sertraline (ZOLOFT) 50 MG tablet Take 1 tablet (50 mg total) by mouth daily with breakfast. 30 tablet 1  . traZODone (DESYREL) 50 MG tablet Take 1 tablet (50 mg total) by mouth at bedtime as needed for sleep. 30 tablet 1  . triamterene-hydrochlorothiazide (MAXZIDE-25) 37.5-25 MG tablet Take 1 tablet by mouth daily. 90 tablet 3  . triamterene-hydrochlorothiazide (MAXZIDE-25) 37.5-25 MG tablet Take 1 tablet by mouth daily. (Patient not taking: Reported on 03/08/2020) 90 tablet 1  . valACYclovir (VALTREX) 500 MG tablet TAKE ONE TABLET BY MOUTH DAILY 30 tablet 5   No current facility-administered medications for this visit.     Psychiatric Specialty Exam: Review of Systems  Last menstrual period 07/28/2015.There is no height or weight on file to calculate BMI.  General Appearance: Fairly Groomed  Eye Contact:  Good  Speech:  Clear and Coherent and Normal Rate  Volume:  Normal  Mood: Euthymic, not anxious like last visit  Affect:  Congruent  Thought Process:  Goal Directed and Descriptions of Associations: Intact  Orientation:  Full (Time, Place, and Person)  Thought Content: Logical   Suicidal Thoughts:  No  Homicidal Thoughts:  No  Memory:  Immediate;   Good Recent;   Good  Judgement:  Fair   Insight:  Fair  Psychomotor Activity:  Normal  Concentration:  Concentration: Good and Attention Span: Good  Recall:  Good  Fund of Knowledge: Good  Language: Good  Akathisia:  Negative  Handed:  Right  AIMS (if indicated): not done  Assets:  Communication Skills Desire for Improvement Financial Resources/Insurance Housing  ADL's:  Intact  Cognition: WNL  Sleep:  Good   Screenings: GAD-7   Health and safety inspector from 01/18/2020 in [facility] Office Visit from 08/25/2019 in [facility] Office Visit from 04/26/2019 in [facility] Office Visit from 02/23/2018 in [facility] Visit from 02/07/2018 in [facility]  Total GAD-7 Score 14 11 9 20 20     PHQ2-9   [facility] Office Visit from 08/25/2019 in [facility] Office Visit from 04/26/2019 in [facility] Office Visit from 02/23/2018 in [facility] Office Visit from 02/07/2018 in [facility] Office Visit from 11/13/2016 in [facility]  PHQ-2 Total Score 4 2 4 6 4   PHQ-9 Total Score 13 5 20 24 19     Flowsheet Row Counselor from 08/22/2020 in [facility] No Risk       Assessment and Plan: Patient stated that she has noticed improvement in her symptoms of intrusive thoughts and skin picking habits after sertraline was added.  She stated that the improvement was marked initially but for the last few days she has restarted skin picking again.  She also anticipates that her anxiety will increase after she discontinues taking estrogen over the next few days.  She was agreeable to adjusting the dose of sertraline for optimal effects.   1. Bipolar  2 disorder, major depressive episode (H[facility])  - mirtazapine (REMERON) 15 MG tablet; Take 1 tablet (15 mg total) by mouth at bedtime.  Dispense: 30 tablet; Refill: 1 - gabapentin (NEURONTIN) 400 MG capsule; Take 1 capsule (400 mg total) by mouth 2 (two) times daily.  Dispense: 60 capsule; Refill: 1 - traZODone (DESYREL) 50 MG tablet; Take 1 tablet (50 mg total) by mouth at bedtime as needed for sleep.  Dispense: 30 tablet; Refill: 1 - Increase sertraline (ZOLOFT) 100 MG tablet; Take 1 tablet (100 mg total) by mouth daily with breakfast.  Dispense: 30 tablet; Refill: 1  2. Generalized anxiety disorder  - busPIRone (BUSPAR) 10 MG tablet; Take 1 tablet (10 mg total) by mouth 3 (three) times daily.  Dispense: 90 tablet; Refill: 1 - sertraline (ZOLOFT) 100 MG tablet; Take 1 tablet (100 mg total) by mouth daily with breakfast.  Dispense: 30 tablet; Refill: 1  3. PTSD (post-traumatic stress disorder)  - mirtazapine (REMERON) 15 MG tablet; Take 1 tablet (15 mg total) by mouth at bedtime.  Dispense: 30 tablet; Refill: 1 - busPIRone (BUSPAR) 10 MG tablet; Take 1 tablet (10 mg total) by mouth 3 (three) times daily.  Dispense: 90 tablet; Refill: 1 - Increase sertraline (ZOLOFT) 100 MG tablet; Take 1 tablet (100 mg total) by mouth daily with breakfast.  Dispense: 30 tablet; Refill: 1  4. Panic attacks  - mirtazapine (REMERON) 15 MG tablet; Take 1 tablet (15 mg total) by mouth at bedtime.  Dispense: 30 tablet; Refill: 1 - sertraline (ZOLOFT) 100 MG tablet; Take 1 tablet (100 mg total) by mouth daily with  breakfast.  Dispense: 30 tablet; Refill: 1  F/up in 6 weeks. Continue individual therapy with Mr. Jonelle Sports, MD 12/13/2020, 10:27 AM

## 2020-12-19 ENCOUNTER — Other Ambulatory Visit: Payer: Self-pay

## 2020-12-19 ENCOUNTER — Ambulatory Visit (INDEPENDENT_AMBULATORY_CARE_PROVIDER_SITE_OTHER): Payer: No Payment, Other | Admitting: Licensed Clinical Social Worker

## 2020-12-19 DIAGNOSIS — F411 Generalized anxiety disorder: Secondary | ICD-10-CM

## 2020-12-19 DIAGNOSIS — F3181 Bipolar II disorder: Secondary | ICD-10-CM | POA: Diagnosis not present

## 2020-12-19 NOTE — Progress Notes (Signed)
   THERAPIST PROGRESS NOTE  Virtual Visit via Video Note  I connected with Margaret Lawson on 12/19/20 at  1:00 PM EDT by a video enabled telemedicine application and verified that I am speaking with the correct person using two identifiers.  Location: Patient: [facility]  Provider: Home    I discussed the limitations of evaluation and management by telemedicine and the availability of in person appointments. The patient expressed understanding and agreed to proceed.      I discussed the assessment and treatment plan with the patient. The patient was provided an opportunity to ask questions and all were answered. The patient agreed with the plan and demonstrated an understanding of the instructions.   The patient was advised to call back or seek an in-person evaluation if the symptoms worsen or if the condition fails to improve as anticipated.  I provided 50  minutes of non-face-to-face time during this encounter.   Dory Horn, LCSW  Session Time: 85   Participation Level: Active  Behavioral Response: CasualAlertAnxious  Type of Therapy: Individual Therapy  Treatment Goals addressed: Diagnosis: bipolar depression and GAD  Interventions: CBT and Supportive  Summary: Margaret Lawson is a 40 y.o. female who presents with bipolar 2 disorder and GAD.   Suicidal/Homicidal: NAwithout intent/plan  Therapist Response:    Subjective/Objective: Pt was alert and oriented x 5. She was dressed casually and engaged well in therapy. Pt presented with depressed and anxious mood/affect. She was cooperative and maintained good eye contact.   Pt started out session depressed due to things going on in the world, pt used examples for school shooting in [place] and the abortion case in the supreme court. She states this has been hard for her to see on a weekly basis. Junelle did her homework that was provided to her by LCSW which is listed in order below.   LCSW and pt spoke  today primarily about setting boundaries with her mother-in-law. Atiya used the example of gong to her nephew's soccer game. Pt still has social anxiety. Mother-in-law stated to her she should go and stated the reason why pt should go. This made pt irritable, angry, and tense because she feels that she is an adult and can make her own choices.   Pan: Pt and LCSW practice two things in session. 1. Sandwich technique, practicing a positive follow by a negative and then a positive. This helps with pt setting boundaries in a constructive way. Example "Mom I really approached your feedback, but this is not a conversation I want to have. Thank you for being there though it means a lot". Other technique pt and LCSW discuss was "Stop, Think, and Listen". This is to help pt pause before speaking. LCSW spoke to pt about how the shortest answers are sometime the best answer. Lamari will attempt these techniques and report back in 3 weeks   Goals/Objective:  1. setting boundaries/how no is a complete sentence 2. Processing things in the world 3. Best friend shot and killed 4. menopause hysterectomy  Assessment: Pt endorses symptoms for depression and anxiety for tension, worry, irritability, insomnia, worthlessness, and fatigue, She does meet criteria for bipolar 2 depressive type and GAD. Melodee is taking her medication as prescribed.   Plan: Return again in  weeks.     Dory Horn, LCSW 12/19/2020

## 2021-01-09 ENCOUNTER — Ambulatory Visit (INDEPENDENT_AMBULATORY_CARE_PROVIDER_SITE_OTHER): Payer: No Payment, Other | Admitting: Licensed Clinical Social Worker

## 2021-01-09 ENCOUNTER — Encounter (HOSPITAL_COMMUNITY): Payer: Self-pay | Admitting: Licensed Clinical Social Worker

## 2021-01-09 DIAGNOSIS — F3181 Bipolar II disorder: Secondary | ICD-10-CM | POA: Diagnosis not present

## 2021-01-09 NOTE — Progress Notes (Signed)
   THERAPIST PROGRESS NOTE  Session Time: 36   Participation Level: Active  Behavioral Response: CasualAlertAnxious  Type of Therapy: Individual Therapy  Treatment Goals addressed: Diagnosis: bipolar disorder   Interventions: CBT  Summary: Margaret Lawson is a 40 y.o. female who presents with Bipolsr dorder.   Suicidal/Homicidal: NAwithout intent/plan  Therapist Response:   Virtual Visit via Video Note  I connected with Margaret Lawson on 01/09/21 at  1:00 PM EDT by a video enabled telemedicine application and verified that I am speaking with the correct person using two identifiers.  Location: Patient: [facility]  Provider: Provider home    I discussed the limitations of evaluation and management by telemedicine and the availability of in person appointments. The patient expressed understanding and agreed to proceed.  Subjective/Objective: Pt was alert and oriented x 5. She was dressed casually and engaged well in therapy session. She presented with depressed and anxious mood/affect. She was cooperative and maintained good eye in session.   Pt reports primary stressor and illness and religion. Margaret Lawson reports that she was Dx with covid-19 earlier this week. She was not feeling 100 percent for session. But did complete a PHQ-9 in todays session and scored a 7 last PHQ-9 charted was a 13. She is improving as evidence by her PHQ-9. She reports that she still would like to stay with f/u every three weeks with LCSW. Pt states that she has been exploring her religion more and more Margaret Lawson reports that she believes in God but is looking into more natural religions. She has started exploring the religion [place].    Intervention/Plan: Pt will continue to f/u with LCSW every 3 weeks. Primary intervention was supportive with positive regard, praise, and reassurance provided in session.   Assessment: Pt endorses symptoms for fatigue, irritability, isolation, and lack of  motivation. She reports that her medications are working well. Pt will continue to take her medications as directed follow plan listed above.     I discussed the assessment and treatment plan with the patient. The patient was provided an opportunity to ask questions and all were answered. The patient agreed with the plan and demonstrated an understanding of the instructions.   The patient was advised to call back or seek an in-person evaluation if the symptoms worsen or if the condition fails to improve as anticipated.  I provided 45 minutes of non-face-to-face time during this encounter.   Dory Horn, LCSW   Plan: Return again in 3 weeks.     Dory Horn, LCSW 01/09/2021

## 2021-01-23 ENCOUNTER — Encounter (HOSPITAL_COMMUNITY): Payer: Self-pay | Admitting: Psychiatry

## 2021-01-23 ENCOUNTER — Telehealth (INDEPENDENT_AMBULATORY_CARE_PROVIDER_SITE_OTHER): Payer: No Payment, Other | Admitting: Psychiatry

## 2021-01-23 DIAGNOSIS — F4310 Post-traumatic stress disorder, unspecified: Secondary | ICD-10-CM | POA: Diagnosis not present

## 2021-01-23 DIAGNOSIS — F411 Generalized anxiety disorder: Secondary | ICD-10-CM

## 2021-01-23 DIAGNOSIS — F3181 Bipolar II disorder: Secondary | ICD-10-CM | POA: Diagnosis not present

## 2021-01-23 DIAGNOSIS — F410 Panic disorder [episodic paroxysmal anxiety] without agoraphobia: Secondary | ICD-10-CM | POA: Diagnosis not present

## 2021-01-23 MED ORDER — LAMOTRIGINE 25 MG PO TABS: ORAL_TABLET | ORAL | 1 refills | Status: DC

## 2021-01-23 MED ORDER — VENLAFAXINE HCL ER 75 MG PO CP24: 75.0000 mg | ORAL_CAPSULE | Freq: Every day | ORAL | 1 refills | Status: DC

## 2021-01-23 MED ORDER — BUSPIRONE HCL 10 MG PO TABS: 10.0000 mg | ORAL_TABLET | Freq: Three times a day (TID) | ORAL | 1 refills | Status: DC

## 2021-01-23 MED ORDER — TRAZODONE HCL 100 MG PO TABS: 100.0000 mg | ORAL_TABLET | Freq: Every day | ORAL | 1 refills | Status: DC

## 2021-01-23 NOTE — Progress Notes (Signed)
[name] OP Progress Note   Virtual Visit via Video Note  I connected with Margaret Lawson on 01/23/21 at 11:00 AM EDT by a video enabled telemedicine application and verified that I am speaking with the correct person using two identifiers.  Location: Patient: Home Provider: Clinic   I discussed the limitations of evaluation and management by telemedicine and the availability of in person appointments. The patient expressed understanding and agreed to proceed.  I provided 22 minutes of non-face-to-face time during this encounter.     01/23/2021 11:34 AM Margaret Lawson  MRN:  [id number]  Chief Complaint:  "  My medication regimen is not where it needs to be."   HPI: Patient reported that she is not really doing too great.  Going up on the dose of sertraline was not too helpful.  She stated that she continues to feel kind of depressed and anxious.  She stated that yesterday she had a very long conversation with her mother-in-law and she essentially said that she thinks that the patient sleeps too much during the day and needs to get a job.  Patient stated that she is very fearful of being in the public and she is not sure if she will ever be able to work again however she wants to see if her medications can be adjusted any. She stated that she does not really sleep well at night mainly because she has night sweats believe to her menopause.  She is no longer on estrogen.  She is now trying to take herbal supplements to see if that would help her.  She stated that she does not have the money to see a gynecologist and has not had a annual gynecological exam in a while. She also has not been in touch with her primary care provider.  She stated that she knows seeing someone for that we will involve a lot of blood work and she does not have the money to pay for all that at this point. Writer advised that she discusses her gynecological issues with either her gynecologist or she can  [facility] that sees patients without insurance. Patient was provided with the address and contact information for the clinic and patient expressed her appreciation for that.  Regarding her medications, she continues to have depression symptoms along with a lot of anxiety.  She is not sure if sertraline is helping at all.  She was given the option of either going up on the dose of sertraline or trying a different medication.  She stated that she prefers to try a different medication.  Writer offered trial of venlafaxine ER as can help with her depression, anxiety symptoms along with PTSD symptoms and can also help with hot flashes related to menopause.  Patient stated that she has taken this medicine in the past and does not recall having any negative effects to it so she is willing to try it again. Regarding her mood stabilization, she stated that she is not really irritable but maybe has some mood swings more in line with hypomanic phase.  Writer asked her if she would like to discontinue gabapentin since it is a low potency mood stabilizer and also can cause some sleepiness during the daytime.  She was willing to try a different option.  Writer recommended trial of Lamictal.  She stated that she is taking in the past and again could not recall any negative or  adverse reactions to it so she was agreeable to try it again. Regarding her sleep, she takes low-dose mirtazapine 15 mg at bedtime along with trazodone 50 mg at bedtime.  Writer recommended that we discontinue the mirtazapine since it also is associated with increased in appetite and resulting in excessive weight gain and maximize the dose of trazodone for more efficacy.  Patient stated that she is willing to try this higher dose of trazodone and discontinue the mirtazapine. Writer suggested that we continue the buspirone at the same dose 10 mg 3 times daily for now and then  after switching all this medications we can see how she does on the combination in the next 6 weeks. Writer explained to the patient that since she has been on gabapentin for quite some time she can cut down the dose to once at bedtime for a week and then stop instead of stopping cold [place].  Similarly she should taper off the sertraline by reducing the dose to half tablet for the next 5 to 6 days and then discontinue.  She was explained that she should wait to discontinue the sertraline completely before she starts taking Effexor. Potential side effects of medication and risks vs benefits of treatment vs non-treatment were explained and discussed. All questions were answered.   Visit Diagnosis:    ICD-10-CM   1. Bipolar 2 disorder, major depressive episode (HCC)  F31.81 venlafaxine XR (EFFEXOR XR) 75 MG 24 hr capsule    lamoTRIgine (LAMICTAL) 25 MG tablet    traZODone (DESYREL) 100 MG tablet    2. Generalized anxiety disorder  F41.1 busPIRone (BUSPAR) 10 MG tablet    venlafaxine XR (EFFEXOR XR) 75 MG 24 hr capsule    3. PTSD (post-traumatic stress disorder)  F43.10 busPIRone (BUSPAR) 10 MG tablet    venlafaxine XR (EFFEXOR XR) 75 MG 24 hr capsule    4. Panic attacks  F41.0 busPIRone (BUSPAR) 10 MG tablet    venlafaxine XR (EFFEXOR XR) 75 MG 24 hr capsule      Past Psychiatric History: Bipolar disorder, PTSD, panic attacks, borderline personality disorder  Past Medical History:  Past Medical History:  Diagnosis Date   Anxiety    severe   Asthma, chronic 12/27/2013   Bipolar 2 disorder ([place])    Essential hypertension, benign 12/27/2013   Fibromyalgia 12/27/2013   GERD (gastroesophageal reflux disease)    Headache    migraines, over stimulation   Leukocytosis 12/27/2013   Low iron    PCOS (polycystic ovarian syndrome)    Perforation of left tympanic membrane 12/27/2013   Poor dentition    right side upper and lower   Restless leg syndrome    Right leg pain    Teeth clenching     Teeth grinding    Weakness    when exposed to hot or cold temperatures    Past Surgical History:  Procedure Laterality Date   CHOLECYSTECTOMY     ROBOTIC ASSISTED TOTAL HYSTERECTOMY WITH BILATERAL SALPINGO OOPHERECTOMY Bilateral 09/10/2015   Procedure: ROBOTIC ASSISTED TOTAL HYSTERECTOMY WITH BILATERAL SALPINGECTOMY;  Surgeon: Lavonia Drafts, MD;  Location: [facility];  Service: Gynecology;  Laterality: Bilateral;   WISDOM TOOTH EXTRACTION      Family Psychiatric History: Dad- depression, Sister- depression  Family History:  Family History  Problem Relation Age of Onset   Aneurysm Mother        brain   Depression Father    Heart disease Father    Mitral valve prolapse Father  Diabetes Father    Hypertension Father    Depression Sister    Breast cancer Paternal Grandfather    Breast cancer Paternal Aunt     Social History:  Social History   Socioeconomic History   Marital status: Married    Spouse name: Not on file   Number of children: Not on file   Years of education: Not on file   Highest education level: Not on file  Occupational History   Occupation: unemployed  Tobacco Use   Smoking status: Every Day    Packs/day: 1.00    Years: 20.00    Pack years: 20.00    Types: Cigarettes   Smokeless tobacco: Never   Tobacco comments:    uses 6mg  vapes  Substance and Sexual Activity   Alcohol use: No    Alcohol/week: 0.0 standard drinks   Drug use: No   Sexual activity: Yes    Partners: Male    Birth control/protection: None  Other Topics Concern   Not on file  Social History Narrative   Not on file   Social Determinants of Health   Financial Resource Strain: Not on file  Food Insecurity: No Food Insecurity   Worried About Charity fundraiser in the Last Year: Never true   Ran Out of Food in the Last Year: Never true  Transportation Needs: Not on file  Physical Activity: Inactive   Days of Exercise per Week: 0 days   Minutes of Exercise per Session:  0 min  Stress: No Stress Concern Present   Feeling of Stress : Only a little  Social Connections: Moderately Isolated   Frequency of Communication with Friends and Family: More than three times a week   Frequency of Social Gatherings with Friends and Family: Twice a week   Attends Religious Services: Never   Marine scientist or Organizations: No   Attends Archivist Meetings: Never   Marital Status: Married    Allergies:  Allergies  Allergen Reactions   Clarithromycin Nausea And Vomiting   Adhesive [Tape] Other (See Comments)    Blisters, paper tape is worse   Ambien [Zolpidem Tartrate]     Insomnia.   Celecoxib Nausea And Vomiting   Doxycycline     Double vision    Erythromycin Nausea And Vomiting   Lyrica [Pregabalin]     Induced lactation   Morphine And Related     Decrease bp   Penicillins Hives    Has patient had a PCN reaction causing immediate rash, facial/tongue/throat swelling, SOB or lightheadedness with hypotension: Yes Has patient had a PCN reaction causing severe rash involving mucus membranes or skin necrosis: No Has patient had a PCN reaction that required hospitalization No Has patient had a PCN reaction occurring within the last 10 years: Yes If all of the above answers are "NO", then may proceed with Cephalosporin use.    Latex Rash    Metabolic Disorder Labs: Lab Results  Component Value Date   HGBA1C 5.8 (H) 06/25/2015   MPG 120 (H) 06/25/2015   No results found for: PROLACTIN Lab Results  Component Value Date   CHOL 213 (H) 02/27/2015   TRIG 210 (H) 02/27/2015   HDL 34 (L) 02/27/2015   CHOLHDL 6.3 (H) 02/27/2015   VLDL 42 (H) 02/27/2015   LDLCALC 137 (H) 02/27/2015   LDLCALC 116 (H) 01/02/2014   Lab Results  Component Value Date   TSH 1.29 11/13/2016   TSH 1.748 02/27/2015  Therapeutic Level Labs: No results found for: LITHIUM No results found for: VALPROATE No components found for:  CBMZ  Current  Medications: Current Outpatient Medications  Medication Sig Dispense Refill   lamoTRIgine (LAMICTAL) 25 MG tablet Take 1 tablet daily for 2 weeks then take 2 tablets daily for 2 weeks then take 3 tablets daily 60 tablet 1   traZODone (DESYREL) 100 MG tablet Take 1 tablet (100 mg total) by mouth at bedtime. 30 tablet 1   venlafaxine XR (EFFEXOR XR) 75 MG 24 hr capsule Take 1 capsule (75 mg total) by mouth daily with breakfast. 30 capsule 1   acetaminophen (TYLENOL) 500 MG tablet Take 500 mg by mouth every 6 (six) hours as needed for mild pain or moderate pain.     albuterol (VENTOLIN HFA) 108 (90 Base) MCG/ACT inhaler INHALE TWO PUFFS BY MOUTH EVERY 4 HOURS AS NEEDED FOR WHEEZING 18 g 3   atenolol (TENORMIN) 25 MG tablet Take 1 tablet (25 mg total) by mouth 2 (two) times daily. 180 tablet 3   busPIRone (BUSPAR) 10 MG tablet Take 1 tablet (10 mg total) by mouth 3 (three) times daily. 90 tablet 1   estradiol (ESTRACE) 1 MG tablet TAKE ONE TABLET BY MOUTH DAILY 90 tablet 0   furosemide (LASIX) 20 MG tablet Take 1 tablet (20 mg total) by mouth daily as needed for edema. 30 tablet 3   ipratropium-albuterol (DUONEB) 0.5-2.5 (3) MG/3ML SOLN Take 3 mLs by nebulization every 2 (two) hours as needed (wheeze, SOB). 60 mL 3   MELATONIN PO Take by mouth.     methocarbamol (ROBAXIN) 500 MG tablet TAKE ONE TABLET BY MOUTH THREE TIMES A DAY 90 tablet 2   montelukast (SINGULAIR) 10 MG tablet TAKE ONE TABLET BY MOUTH AT BEDTIME 90 tablet 2   Multiple Vitamin (MULTIVITAMIN) capsule Take 1 capsule by mouth daily.     pantoprazole (PROTONIX) 40 MG tablet TAKE ONE TABLET BY MOUTH DAILY 90 tablet 2   potassium chloride (KLOR-CON) 10 MEQ tablet Take 1 tablet (10 mEq total) by mouth 2 (two) times daily. 180 tablet 3   triamterene-hydrochlorothiazide (MAXZIDE-25) 37.5-25 MG tablet Take 1 tablet by mouth daily. 90 tablet 3   triamterene-hydrochlorothiazide (MAXZIDE-25) 37.5-25 MG tablet Take 1 tablet by mouth daily.  (Patient not taking: Reported on 03/08/2020) 90 tablet 1   valACYclovir (VALTREX) 500 MG tablet TAKE ONE TABLET BY MOUTH DAILY 30 tablet 5   No current facility-administered medications for this visit.     Psychiatric Specialty Exam: Review of Systems  Last menstrual period 07/28/2015.There is no height or weight on file to calculate BMI.  General Appearance: Fairly Groomed  Eye Contact:  Good  Speech:  Clear and Coherent and Normal Rate  Volume:  Normal  Mood: depressed  Affect:  Congruent  Thought Process:  Goal Directed and Descriptions of Associations: Intact  Orientation:  Full (Time, Place, and Person)  Thought Content: Logical   Suicidal Thoughts:  No  Homicidal Thoughts:  No  Memory:  Immediate;   Good Recent;   Good  Judgement:  Fair  Insight:  Fair  Psychomotor Activity:  Normal  Concentration:  Concentration: Good and Attention Span: Good  Recall:  Good  Fund of Knowledge: Good  Language: Good  Akathisia:  Negative  Handed:  Right  AIMS (if indicated): not done  Assets:  Communication Skills Desire for Improvement Financial Resources/Insurance Housing  ADL's:  Intact  Cognition: WNL  Sleep:  Fair   Screenings: GAD-7  Flowsheet Row Video Visit from 01/23/2021 in [facility] Counselor from 01/18/2020 in [facility] Office Visit from 08/25/2019 in [place] Office Visit from 04/26/2019 in [place] Visit from 02/23/2018 in [place]  Total GAD-7 Score 8 14 11 9 20       PHQ2-9    Flowsheet Row Video Visit from 01/23/2021 in [facility] Counselor from 01/09/2021 in [facility] Office Visit from 08/25/2019 in [place] Visit from 04/26/2019 in [place]  Office Visit from 02/23/2018 in [place]  PHQ-2 Total Score 4 1 4 2 4   PHQ-9 Total Score 16 7 13 5 20       Flowsheet Row Video Visit from 01/23/2021 in [facility] Counselor from 08/22/2020 in [place] No Risk No Risk        Assessment and Plan: Patient is endorsing ongoing depression symptoms and also reported feeling somewhat sleepy during the daytime because she is not sleeping well at night due to hot flashes due to menopause.  She does not have a gynecologist or primary care provider.  Writer gave [facility] to [facility] her hormone therapy.  She recently stopped her estrogen. A lot of changes to her medications were made today since the combination she was on was not effective.  We will taper off sertraline and then start Effexor XR to target depression, anxiety and hot flashes due to menopause.  Will discontinue gabapentin due to lack of efficacy and switch to Lamictal for mood stabilization.  We will adjust the dose of trazodone for optimal effects and discontinue mirtazapine due to lack of efficacy. Will continue buspirone for now.  1. Bipolar 2 disorder, major depressive episode (HCC)  -  Taper off sertraline, reduce the dose to 50 mg for 6 days then stop and then start Venlafaxine XR. - Start venlafaxine XR (EFFEXOR XR) 75 MG 24 hr capsule; Take 1 capsule (75 mg total) by mouth daily with breakfast.  Dispense: 30 capsule; Refill: 1 - Discontinue Gabapentin and switch to lamictal for mood stabilization. - Start lamoTRIgine (LAMICTAL) 25 MG tablet; Take 1 tablet daily for 2 weeks then take 2 tablets daily for 2 weeks then take 3 tablets daily  Dispense: 60 tablet; Refill: 1 - Increase traZODone (DESYREL) 100 MG tablet; Take 1 tablet (100 mg total) by mouth at bedtime.  Dispense: 30 tablet; Refill: 1 - Discontinue Mirtazapine  due to lack of efficacy.  2. Generalized anxiety disorder  - busPIRone (BUSPAR) 10 MG tablet; Take 1 tablet (10 mg total) by mouth 3 (three) times daily.  Dispense: 90 tablet; Refill: 1 - Start venlafaxine XR (EFFEXOR XR) 75 MG 24 hr capsule; Take 1 capsule (75 mg total) by mouth daily with breakfast.  Dispense: 30 capsule; Refill: 1  3. PTSD (post-traumatic stress disorder)  - busPIRone (BUSPAR) 10 MG tablet; Take 1 tablet (10 mg total) by mouth 3 (three) times daily.  Dispense: 90 tablet; Refill: 1 - Start venlafaxine XR (EFFEXOR XR) 75 MG 24 hr capsule; Take 1 capsule (75 mg total) by mouth daily with breakfast.  Dispense: 30 capsule; Refill: 1  4. Panic attacks  - busPIRone (BUSPAR) 10 MG tablet;  Take 1 tablet (10 mg total) by mouth 3 (three) times daily.  Dispense: 90 tablet; Refill: 1 - Start venlafaxine XR (EFFEXOR XR) 75 MG 24 hr capsule; Take 1 capsule (75 mg total) by mouth daily with breakfast.  Dispense: 30 capsule; Refill: 1  F/up in 6 weeks. Continue individual therapy with Mr. Quita Skye.  Patient is aware that writer is leaving the practice and therefore her care is being transferred to a different provider.  Patient wished Probation officer the best.  [place] Crane, MD 01/23/2021, 11:34 AM

## 2021-01-30 ENCOUNTER — Other Ambulatory Visit: Payer: Self-pay

## 2021-01-30 ENCOUNTER — Ambulatory Visit (INDEPENDENT_AMBULATORY_CARE_PROVIDER_SITE_OTHER): Payer: No Payment, Other | Admitting: Licensed Clinical Social Worker

## 2021-01-30 DIAGNOSIS — F4310 Post-traumatic stress disorder, unspecified: Secondary | ICD-10-CM

## 2021-01-30 DIAGNOSIS — F3181 Bipolar II disorder: Secondary | ICD-10-CM

## 2021-01-30 NOTE — Progress Notes (Signed)
   THERAPIST PROGRESS NOTE  Session Time: 97  Virtual Visit via Video Note  I connected with [name] on 01/30/21 at  1:00 PM EDT by a video enabled telemedicine application and verified that I am speaking with the correct person using two identifiers.  Location: Patient: [facility]  Provider: Providers Home    I discussed the limitations of evaluation and management by telemedicine and the availability of in person appointments. The patient expressed understanding and agreed to proceed.     I discussed the assessment and treatment plan with the patient. The patient was provided an opportunity to ask questions and all were answered. The patient agreed with the plan and demonstrated an understanding of the instructions.   The patient was advised to call back or seek an in-person evaluation if the symptoms worsen or if the condition fails to improve as anticipated.  I provided 45 minutes of non-face-to-face time during this encounter.   Dory Horn, LCSW   Participation Level: Active  Behavioral Response: CasualAlertAnxious and Depressed  Type of Therapy: Individual Therapy  Treatment Goals addressed: Diagnosis: depression and anxiety   Interventions: CBT and Supportive  Summary: Will Schier is a 40 y.o. female who presents with depressed and anxious mood/affect. She was cooperative and maintained good eye contact. Pt was dressed casually in session.   Primary stressor for pt is family conflict. Jolaine reports that since she has moved 1 year ago in with her mother-in-law things have been going well. Pt reports she felt she had a good relationship with her mother-in-law. Timarie now reports that her mother-in-law has confronted her about her mental health and stated to pt "You should be progressing in therapy more than you have". Harly reports this made her very tense and irritable toward her mother-in-law. Like she was not good enough and that she  needed to do things on her mother in laws time. Pt reports that she has been struggling as she started on new medication 30 days ago and she needed to stop one because she felt so bad on it. Medication reported that she stopped was Effexor.  Suicidal/Homicidal: NAwithout intent/plan  Therapist Response:    Intervention/Plan: LCSW used supportive and trauma informed therapy in today's session. LCSW provided pt with encouragement, advice giving, and positive regard. Trauma therapy was used for exposure therapy as pt reports that she struggles with anxiety in big crowds. Pt to start journaling her thoughts and feeling post crowd exposure then report back to LCSW  Plan: Return again in 4 weeks.      Dory Horn, LCSW 01/30/2021

## 2021-02-07 ENCOUNTER — Telehealth (INDEPENDENT_AMBULATORY_CARE_PROVIDER_SITE_OTHER): Payer: Self-pay | Admitting: Physician Assistant

## 2021-02-07 VITALS — BP 119/78 | HR 86 | Ht 68.0 in | Wt 262.0 lb

## 2021-02-07 DIAGNOSIS — Z79890 Hormone replacement therapy (postmenopausal): Secondary | ICD-10-CM

## 2021-02-07 DIAGNOSIS — I10 Essential (primary) hypertension: Secondary | ICD-10-CM

## 2021-02-07 DIAGNOSIS — R000 Tachycardia, unspecified: Secondary | ICD-10-CM

## 2021-02-07 DIAGNOSIS — R232 Flushing: Secondary | ICD-10-CM

## 2021-02-07 DIAGNOSIS — E8940 Asymptomatic postprocedural ovarian failure: Secondary | ICD-10-CM

## 2021-02-07 DIAGNOSIS — R61 Generalized hyperhidrosis: Secondary | ICD-10-CM

## 2021-02-07 DIAGNOSIS — Z5181 Encounter for therapeutic drug level monitoring: Secondary | ICD-10-CM

## 2021-02-07 DIAGNOSIS — R5382 Chronic fatigue, unspecified: Secondary | ICD-10-CM

## 2021-02-07 DIAGNOSIS — J4540 Moderate persistent asthma, uncomplicated: Secondary | ICD-10-CM

## 2021-02-07 DIAGNOSIS — R110 Nausea: Secondary | ICD-10-CM

## 2021-02-07 MED ORDER — TRIAMTERENE-HCTZ 37.5-25 MG PO TABS: 1.0000 | ORAL_TABLET | Freq: Every day | ORAL | 3 refills | Status: DC

## 2021-02-07 MED ORDER — FUROSEMIDE 20 MG PO TABS: 20.0000 mg | ORAL_TABLET | Freq: Every day | ORAL | 3 refills | Status: DC | PRN

## 2021-02-07 MED ORDER — ATENOLOL 25 MG PO TABS: 25.0000 mg | ORAL_TABLET | Freq: Two times a day (BID) | ORAL | 3 refills | Status: DC

## 2021-02-08 ENCOUNTER — Encounter: Payer: Self-pay | Admitting: Physician Assistant

## 2021-02-11 NOTE — Telephone Encounter (Signed)
We may have to leave this 1 for [facility] and even have documentation that she is ever even written estrogen for [place] and I do not know what diagnosis it specifically for I do not know if it is premature ovarian failure etc. as there are no notes.  Who wrote her medication previously question mark was at OB/GYN?  Maybe she can get a temporary refill from them.

## 2021-02-17 ENCOUNTER — Encounter: Payer: Self-pay | Admitting: Physician Assistant

## 2021-02-17 DIAGNOSIS — R61 Generalized hyperhidrosis: Secondary | ICD-10-CM | POA: Insufficient documentation

## 2021-02-17 DIAGNOSIS — R5382 Chronic fatigue, unspecified: Secondary | ICD-10-CM | POA: Insufficient documentation

## 2021-02-17 DIAGNOSIS — R232 Flushing: Secondary | ICD-10-CM | POA: Insufficient documentation

## 2021-02-17 DIAGNOSIS — R000 Tachycardia, unspecified: Secondary | ICD-10-CM | POA: Insufficient documentation

## 2021-02-17 DIAGNOSIS — R110 Nausea: Secondary | ICD-10-CM | POA: Insufficient documentation

## 2021-02-17 MED ORDER — ESTRADIOL 1 MG PO TABS: 1.0000 mg | ORAL_TABLET | Freq: Every day | ORAL | 1 refills | Status: DC

## 2021-02-17 NOTE — Progress Notes (Addendum)
..Virtual Visit via Video Note  I connected with Margaret Lawson on 02/07/2021 at  1:20 PM EDT by a video enabled telemedicine application and verified that I am speaking with the correct person using two identifiers.  Location: Patient: home Provider: clinic  .Marland KitchenParticipating in visit:  Patient: Margaret Lawson Provider: Iran Planas PA-C   I discussed the limitations of evaluation and management by telemedicine and the availability of in person appointments. The patient expressed understanding and agreed to proceed.  History of Present Illness: Pt is a 40 yo obese female with HTN, Asthma, Migraines, bipolar 2, fibromyalgia, HLD who calls into the clinic to discuss concerns.   Patient is done some research recently and she feels like some of her symptoms characterize POTS syndrome.   She is just desperate to feel better.  She has had numerous symptoms for many years and wonders if there is just anything that can be done about it.  She continues to have intermittent migraines, nausea, exhaustion, fatigue, excessive sweating, heat intolerance, numbness and tingling of extremities, double vision, shortness of breath.  What makes her think POTS is sometimes after she stands for a little bit she gets really dizzy and has a worsening of these flushing symptoms.  She did check her blood pressure at home. Laying 123/79 with a heart rate of 98 Sitting 115/86 with a heart rate of 89 Standing 126/82 with a heart rate of 80  She is not on HRT from surgical menopause. She stopped did not think it was helping a few months ago.     .. 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   History of cholecystectomy 09/11/2010   Generalized anxiety disorder 09/10/2015   Hypokalemia 09/27/2015   Benign abdominal serous tumor 12/10/2015   Surgical menopause 04/23/2016   Cervical spondylosis without myelopathy 04/23/2016   Nipple discharge in female 08/03/2016   Anorectal skin tags 08/03/2016   Bipolar 2 disorder, major depressive episode ([place]) 11/13/2016   Frequent headaches 11/15/2016   Family history of brain aneurysm 11/15/2016   Moderate persistent asthma without complication [id number]   Functional diarrhea 12/03/2016   Fatigue 02/13/2018   Inattention 02/13/2018   Hot flashes 02/26/2018   Panic attacks 03/30/2018   Insomnia 03/30/2018   Grief reaction 08/28/2019   Weight gain 08/28/2019   Hot flashes due to surgical menopause 08/28/2019   Female climacteric state 08/28/2019   PTSD (post-traumatic stress disorder) 01/10/2020   Bipolar disorder ([place]) 01/10/2020   Borderline personality disorder ([place]) 01/10/2020   Tachycardia 02/17/2021   Nausea 02/17/2021   Chronic fatigue 02/17/2021   Excessive sweating 02/17/2021   Flushing 02/17/2021   Resolved Ambulatory Problems    Diagnosis Date Noted   Asthma, chronic 12/27/2013   Perforation of left tympanic membrane 12/27/2013   Bipolar I disorder, most recent episode depressed ([place]) 10/11/2014   Ovarian cyst, right 07/15/2015   Amenorrhea 07/15/2015   Abnormal uterine bleeding (AUB) 09/10/2015   Post-operative state 09/10/2015   Acute tonsillitis 04/23/2016   Loose stools 08/03/2016   No energy 11/13/2016   Acute non-recurrent maxillary sinusitis 12/03/2016   Past Medical History:  Diagnosis Date   Anxiety  Bipolar 2 disorder (HCC)    Headache    Low iron    PCOS (polycystic ovarian syndrome)    Poor dentition    Restless leg syndrome    Right leg pain    Teeth clenching    Teeth grinding    Weakness     Observations/Objective: No acute  distress Anxious and tearful.  No labored breathing  .Marland Kitchen Today's Vitals   02/07/21 1318  BP: 119/78  Pulse: 86  Weight: 262 lb (118.8 kg)  Height: '5\' 8"'$  (1.727 m)   Body mass index is 39.84 kg/m.  .. Depression screen [facility] 2/9 08/25/2019 04/26/2019 02/23/2018 02/07/2018 11/13/2016  Decreased Interest '2 1 2 3 2  '$ Down, Depressed, Hopeless '2 1 2 3 2  '$ PHQ - 2 Score '4 2 4 6 4  '$ Altered sleeping '2 1 3 3 1  '$ Tired, decreased energy '2 1 3 3 3  '$ Change in appetite 1 0 '2 3 3  '$ Feeling bad or failure about yourself  '1 1 3 3 2  '$ Trouble concentrating 2 0 '2 3 3  '$ Moving slowly or fidgety/restless 1 0 '2 3 3  '$ Suicidal thoughts 0 0 1 - 0  PHQ-9 Score '13 5 20 24 19  '$ Difficult doing work/chores Somewhat difficult Somewhat difficult Extremely dIfficult Extremely dIfficult -  Some encounter information is confidential and restricted. Go to Review Flowsheets activity to see all data.   .. GAD 7 : Generalized Anxiety Score 08/25/2019 04/26/2019 02/23/2018 02/07/2018  Nervous, Anxious, on Edge '2 3 3 3  '$ Control/stop worrying '2 2 3 3  '$ Worry too much - different things '2 1 3 3  '$ Trouble relaxing '2 2 3 3  '$ Restless '1 1 3 3  '$ Easily annoyed or irritable 1 0 2 3  Afraid - awful might happen 1 0 3 2  Total GAD 7 Score '11 9 20 20  '$ Anxiety Difficulty Very difficult Somewhat difficult Extremely difficult Extremely difficult  Some encounter information is confidential and restricted. Go to Review Flowsheets activity to see all data.       Assessment and Plan: Marland KitchenMarland KitchenChenoa was seen today for hypertension.  Diagnoses and all orders for this visit:  Moderate persistent asthma without complication  Essential hypertension, benign -     triamterene-hydrochlorothiazide (MAXZIDE-25) 37.5-25 MG tablet; Take 1 tablet by mouth daily. -     atenolol (TENORMIN) 25 MG tablet; Take 1 tablet (25 mg total) by mouth 2 (two) times daily. -     furosemide (LASIX) 20 MG tablet; Take 1 tablet (20 mg total) by mouth daily as needed for  edema.  Tachycardia -     atenolol (TENORMIN) 25 MG tablet; Take 1 tablet (25 mg total) by mouth 2 (two) times daily. -     Cortisol, free, Serum -     B12 and Folate Panel -     VITAMIN D 25 Hydroxy (Vit-D Deficiency, Fractures) -     TSH -     CBC w/Diff/Platelet -     Fe+TIBC+Fer -     COMPLETE METABOLIC PANEL WITH GFR -     ACTH  Flushing -     Cortisol, free, Serum -     B12 and Folate Panel -     VITAMIN D 25 Hydroxy (Vit-D Deficiency, Fractures) -     TSH -     CBC w/Diff/Platelet -     Fe+TIBC+Fer -     COMPLETE METABOLIC PANEL WITH GFR -     ACTH  Excessive sweating -     Cortisol, free, Serum -     B12 and Folate Panel -     VITAMIN D 25 Hydroxy (Vit-D Deficiency, Fractures) -     TSH -     CBC w/Diff/Platelet -     Fe+TIBC+Fer -     COMPLETE METABOLIC PANEL WITH GFR -     ACTH  Chronic fatigue -     Cortisol, free, Serum -     B12 and Folate Panel -     VITAMIN D 25 Hydroxy (Vit-D Deficiency, Fractures) -     TSH -     CBC w/Diff/Platelet -     Fe+TIBC+Fer -     COMPLETE METABOLIC PANEL WITH GFR -     ACTH  Nausea -     B12 and Folate Panel -     TSH -     CBC w/Diff/Platelet -     Fe+TIBC+Fer -     COMPLETE METABOLIC PANEL WITH GFR -     ACTH  Surgical menopause -     B12 and Folate Panel -     VITAMIN D 25 Hydroxy (Vit-D Deficiency, Fractures) -     TSH -     CBC w/Diff/Platelet -     Fe+TIBC+Fer -     COMPLETE METABOLIC PANEL WITH GFR -     ACTH  I do not think patients symptoms support POTS her HR does not increase when standing, infact, it decreases.  She is not on estrace. I think she should restart a lot of these symptoms seem like menopausal symptoms. Lets see how she feels in next 3 months.  Ordered labs for more evaluation of symptoms.  BP and HR look good today.    Follow Up Instructions:    I discussed the assessment and treatment plan with the patient. The patient was provided an opportunity to ask questions and all were  answered. The patient agreed with the plan and demonstrated an understanding of the instructions.   The patient was advised to call back or seek an in-person evaluation if the symptoms worsen or if the condition fails to improve as anticipated.    Iran Planas, PA-C

## 2021-02-17 NOTE — Addendum Note (Signed)
Addended byDonella Stade on: 02/17/2021 11:55 AM   Modules accepted: Orders

## 2021-02-20 ENCOUNTER — Ambulatory Visit (INDEPENDENT_AMBULATORY_CARE_PROVIDER_SITE_OTHER): Payer: No Payment, Other | Admitting: Licensed Clinical Social Worker

## 2021-02-20 ENCOUNTER — Other Ambulatory Visit: Payer: Self-pay

## 2021-02-20 DIAGNOSIS — F3181 Bipolar II disorder: Secondary | ICD-10-CM

## 2021-02-20 DIAGNOSIS — F411 Generalized anxiety disorder: Secondary | ICD-10-CM
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
## 2021-03-13 ENCOUNTER — Ambulatory Visit (INDEPENDENT_AMBULATORY_CARE_PROVIDER_SITE_OTHER): Payer: No Payment, Other | Admitting: Licensed Clinical Social Worker

## 2021-03-13 ENCOUNTER — Other Ambulatory Visit: Payer: Self-pay

## 2021-03-13 ENCOUNTER — Telehealth (INDEPENDENT_AMBULATORY_CARE_PROVIDER_SITE_OTHER): Payer: No Payment, Other | Admitting: Physician Assistant

## 2021-03-13 DIAGNOSIS — F410 Panic disorder [episodic paroxysmal anxiety] without agoraphobia: Secondary | ICD-10-CM

## 2021-03-13 DIAGNOSIS — F411 Generalized anxiety disorder: Secondary | ICD-10-CM | POA: Diagnosis not present

## 2021-03-13 DIAGNOSIS — F3181 Bipolar II disorder: Secondary | ICD-10-CM

## 2021-03-13 DIAGNOSIS — F4310 Post-traumatic stress disorder, unspecified: Secondary | ICD-10-CM

## 2021-03-13 MED ORDER — MIRTAZAPINE 15 MG PO TABS
15.0000 mg | ORAL_TABLET | Freq: Every day | ORAL | 1 refills | Status: DC
Start: 2021-03-13 — End: 2021-05-02

## 2021-03-13 MED ORDER — BUSPIRONE HCL 15 MG PO TABS: 15.0000 mg | ORAL_TABLET | Freq: Two times a day (BID) | ORAL | 1 refills | Status: DC

## 2021-03-13 MED ORDER — LAMOTRIGINE 100 MG PO TABS: 100.0000 mg | ORAL_TABLET | Freq: Every day | ORAL | 1 refills | Status: DC

## 2021-03-13 NOTE — Progress Notes (Signed)
   THERAPIST PROGRESS NOTE  Session Time: 10  Virtual Visit via Video Note  I connected with Margaret Lawson on 03/13/21 at  1:00 PM EDT by a video enabled telemedicine application and verified that I am speaking with the correct person using two identifiers.  Location: Patient: [facility]  Provider: Providers home    I discussed the limitations of evaluation and management by telemedicine and the availability of in person appointments. The patient expressed understanding and agreed to proceed.   Pt was alert and oriented x 5. She was dressed casually and engaged well in therapy session. Pt presented with depressed mood/affect. She was pleasant cooperative and maintained good eye contact.   Primary stressor for pt is increase in her depressive and anxiety symptoms. She reports that she has been struggling with the right balance of her medications for some time. Currently she is having trouble sleeping due to increase of rapid thoughts. LCSW notified medication provider via epic messages as pt is to be seen at 1430 today. LCSW administered PHQ-9 last score was a 16 todays score 13. GAD-7 was also administered in session last score was an 8 todays score 17.   Pt reports some factors that could contribute to this is lack of sleep, financial stress (spouse does not work and pt looking for a job), and housing (currently at mother in laws home and struggles to see eye to eye with her due to her mental health)    Plan: 1. f/u with medication provider and explain symptoms and side effects of medications 2. Follow medication provider instruction of medications 3. Journal during increase in rapid thoughts, in attempt to help organize her thought process at least 1 x per week 4. Pt to draw/color 3 x weekly.    Intervention: LCSW utilized summarization, reflection and silence in today's session for psychotherapy. For supportive therapy Praise and encourage was provided for pt. Plan is listed above  and pt to f/u in 3 weeks   I discussed the assessment and treatment plan with the patient. The patient was provided an opportunity to ask questions and all were answered. The patient agreed with the plan and demonstrated an understanding of the instructions.   The patient was advised to call back or seek an in-person evaluation if the symptoms worsen or if the condition fails to improve as anticipated.  I provided 40 minutes of non-face-to-face time during this encounter.   Dory Horn, LCSW   Participation Level: Active  Behavioral Response: CasualAlertAnxious and Depressed  Type of Therapy: Individual Therapy  Treatment Goals addressed: Anxiety and Diagnosis: depression   Interventions: CBT, Supportive, Reframing, and Other: Psychotherapy   Summary: Margaret Lawson is a 40 y.o. female who presents with  .   Suicidal/Homicidal: NAwithout intent/plan    Therapist Response:    Intervention/Plan:   Plan: Return again in 4 weeks.     Dory Horn, LCSW 03/13/2021

## 2021-03-14 NOTE — Progress Notes (Signed)
[place] MD/PA/NP OP Progress Note  Virtual Visit via Telephone Note  I connected with Margaret Lawson on 03/13/21 at  2:30 PM EDT by telephone and verified that I am speaking with the correct person using two identifiers.  Location: Patient: Home Provider: Clinic   I discussed the limitations, risks, security and privacy concerns of performing an evaluation and management service by telephone and the availability of in person appointments. I also discussed with the patient that there may be a patient responsible charge related to this service. The patient expressed understanding and agreed to proceed.  Follow Up Instructions:  I discussed the assessment and treatment plan with the patient. The patient was provided an opportunity to ask questions and all were answered. The patient agreed with the plan and demonstrated an understanding of the instructions.   The patient was advised to call back or seek an in-person evaluation if the symptoms worsen or if the condition fails to improve as anticipated.  I provided 23 minutes of non-face-to-face time during this encounter.  [name], PA    03/14/2021 1:40 AM [name]  MRN:  [id number]  Chief Complaint: Follow up and medication management  HPI:     Visit Diagnosis:    ICD-10-CM   1. Generalized anxiety disorder  F41.1 busPIRone (BUSPAR) 15 MG tablet    mirtazapine (REMERON) 15 MG tablet    2. PTSD (post-traumatic stress disorder)  F43.10 busPIRone (BUSPAR) 15 MG tablet    mirtazapine (REMERON) 15 MG tablet    3. Panic attacks  F41.0 busPIRone (BUSPAR) 15 MG tablet    mirtazapine (REMERON) 15 MG tablet    4. Bipolar 2 disorder, major depressive episode (HCC)  F31.81 mirtazapine (REMERON) 15 MG tablet    lamoTRIgine (LAMICTAL) 100 MG tablet      Past Psychiatric History:   Bipolar disorder PTSD Panic attacks Borderline personality disorder  Past Medical History:  Past Medical History:  Diagnosis Date    Anxiety    severe   Asthma, chronic 12/27/2013   Bipolar 2 disorder ([place])    Essential hypertension, benign 12/27/2013   Fibromyalgia 12/27/2013   GERD (gastroesophageal reflux disease)    Headache    migraines, over stimulation   Leukocytosis 12/27/2013   Low iron    PCOS (polycystic ovarian syndrome)    Perforation of left tympanic membrane 12/27/2013   Poor dentition    right side upper and lower   Restless leg syndrome    Right leg pain    Teeth clenching    Teeth grinding    Weakness    when exposed to hot or cold temperatures    Past Surgical History:  Procedure Laterality Date   CHOLECYSTECTOMY     ROBOTIC ASSISTED TOTAL HYSTERECTOMY WITH BILATERAL SALPINGO OOPHERECTOMY Bilateral 09/10/2015   Procedure: ROBOTIC ASSISTED TOTAL HYSTERECTOMY WITH BILATERAL SALPINGECTOMY;  Surgeon: Lavonia Drafts, MD;  Location: [facility];  Service: Gynecology;  Laterality: Bilateral;   WISDOM TOOTH EXTRACTION      Family Psychiatric History:  Dad - Depression Sister - Depression  Family History:  Family History  Problem Relation Age of Onset   Aneurysm Mother        brain   Depression Father    Heart disease Father    Mitral valve prolapse Father    Diabetes Father    Hypertension Father    Depression Sister    Breast cancer Paternal Grandfather    Breast cancer Paternal Aunt     Social History:  Social History   Socioeconomic History   Marital status: Married    Spouse name: Not on file   Number of children: Not on file   Years of education: Not on file   Highest education level: Not on file  Occupational History   Occupation: unemployed  Tobacco Use   Smoking status: Every Day    Packs/day: 1.00    Years: 20.00    Pack years: 20.00    Types: Cigarettes   Smokeless tobacco: Never   Tobacco comments:    uses '6mg'$  vapes  Substance and Sexual Activity   Alcohol use: No    Alcohol/week: 0.0 standard drinks   Drug use: No   Sexual activity: Yes    Partners: Male     Birth control/protection: None  Other Topics Concern   Not on file  Social History Narrative   Not on file   Social Determinants of Health   Financial Resource Strain: Not on file  Food Insecurity: No Food Insecurity   Worried About Charity fundraiser in the Last Year: Never true   Ran Out of Food in the Last Year: Never true  Transportation Needs: Not on file  Physical Activity: Inactive   Days of Exercise per Week: 0 days   Minutes of Exercise per Session: 0 min  Stress: No Stress Concern Present   Feeling of Stress : Only a little  Social Connections: Moderately Isolated   Frequency of Communication with Friends and Family: More than three times a week   Frequency of Social Gatherings with Friends and Family: Twice a week   Attends Religious Services: Never   Marine scientist or Organizations: No   Attends Archivist Meetings: Never   Marital Status: Married    Allergies:  Allergies  Allergen Reactions   Clarithromycin Nausea And Vomiting   Adhesive [Tape] Other (See Comments)    Blisters, paper tape is worse   Ambien [Zolpidem Tartrate]     Insomnia.   Celecoxib Nausea And Vomiting   Doxycycline     Double vision    Erythromycin Nausea And Vomiting   Lyrica [Pregabalin]     Induced lactation   Morphine And Related     Decrease bp   Penicillins Hives    Has patient had a PCN reaction causing immediate rash, facial/tongue/throat swelling, SOB or lightheadedness with hypotension: Yes Has patient had a PCN reaction causing severe rash involving mucus membranes or skin necrosis: No Has patient had a PCN reaction that required hospitalization No Has patient had a PCN reaction occurring within the last 10 years: Yes If all of the above answers are "NO", then may proceed with Cephalosporin use.    Latex Rash    Metabolic Disorder Labs: Lab Results  Component Value Date   HGBA1C 5.8 (H) 06/25/2015   MPG 120 (H) 06/25/2015   No results found for:  PROLACTIN Lab Results  Component Value Date   CHOL 213 (H) 02/27/2015   TRIG 210 (H) 02/27/2015   HDL 34 (L) 02/27/2015   CHOLHDL 6.3 (H) 02/27/2015   VLDL 42 (H) 02/27/2015   LDLCALC 137 (H) 02/27/2015   LDLCALC 116 (H) 01/02/2014   Lab Results  Component Value Date   TSH 1.29 11/13/2016   TSH 1.748 02/27/2015    Therapeutic Level Labs: No results found for: LITHIUM No results found for: VALPROATE No components found for:  CBMZ  Current Medications: Current Outpatient Medications  Medication Sig Dispense Refill  mirtazapine (REMERON) 15 MG tablet Take 1 tablet (15 mg total) by mouth at bedtime. 30 tablet 1   acetaminophen (TYLENOL) 500 MG tablet Take 500 mg by mouth every 6 (six) hours as needed for mild pain or moderate pain.     albuterol (VENTOLIN HFA) 108 (90 Base) MCG/ACT inhaler INHALE TWO PUFFS BY MOUTH EVERY 4 HOURS AS NEEDED FOR WHEEZING 18 g 3   atenolol (TENORMIN) 25 MG tablet Take 1 tablet (25 mg total) by mouth 2 (two) times daily. 180 tablet 3   busPIRone (BUSPAR) 15 MG tablet Take 1 tablet (15 mg total) by mouth 2 (two) times daily. 60 tablet 1   estradiol (ESTRACE) 1 MG tablet Take 1 tablet (1 mg total) by mouth daily. 90 tablet 1   furosemide (LASIX) 20 MG tablet Take 1 tablet (20 mg total) by mouth daily as needed for edema. 30 tablet 3   ipratropium-albuterol (DUONEB) 0.5-2.5 (3) MG/3ML SOLN Take 3 mLs by nebulization every 2 (two) hours as needed (wheeze, SOB). 60 mL 3   lamoTRIgine (LAMICTAL) 100 MG tablet Take 1 tablet (100 mg total) by mouth daily. 30 tablet 1   MELATONIN PO Take by mouth.     methocarbamol (ROBAXIN) 500 MG tablet TAKE ONE TABLET BY MOUTH THREE TIMES A DAY 90 tablet 2   Multiple Vitamin (MULTIVITAMIN) capsule Take 1 capsule by mouth daily.     Nutritional Supplements (MENOPAUSE FORMULA PO) Take by mouth.     pantoprazole (PROTONIX) 40 MG tablet TAKE ONE TABLET BY MOUTH DAILY 90 tablet 2   triamterene-hydrochlorothiazide (MAXZIDE-25)  37.5-25 MG tablet Take 1 tablet by mouth daily. 90 tablet 3   No current facility-administered medications for this visit.     Musculoskeletal: Strength & Muscle Tone: Unable to assess due to telemedicine visit [place]: Unable to assess due to telemedicine visit Patient leans: Unable to assess due to telemedicine visit  Psychiatric Specialty Exam: Review of Systems  Last menstrual period 07/28/2015.There is no height or weight on file to calculate BMI.  General Appearance: Unable to assess due to telemedicine visit  Eye Contact:  Unable to assess due to telemedicine visit  Speech:  Clear and Coherent and Normal Rate  Volume:  Normal  Mood:  Anxious and Depressed  Affect:  Congruent and Depressed  Thought Process:  Coherent, Goal Directed, and Descriptions of Associations: Intact  Orientation:  Full (Time, Place, and Person)  Thought Content: Logical   Suicidal Thoughts:  No  Homicidal Thoughts:  No  Memory:  Immediate;   Good Recent;   Good Remote;   Good  Judgement:  Fair  Insight:  Fair  Psychomotor Activity:  Normal  Concentration:  Concentration: Good and Attention Span: Good  Recall:  Good  Fund of Knowledge: Good  Language: Good  Akathisia:  Negative  Handed:  Right  AIMS (if indicated): not done  Assets:  Communication Skills Desire for Improvement Financial Resources/Insurance Housing  ADL's:  Intact  Cognition: WNL  Sleep:  Fair   Screenings: GAD-7    Health and safety inspector from 03/13/2021 in [facility] Video Visit from 01/23/2021 in [facility] Counselor from 01/18/2020 in [facility] Office Visit from 08/25/2019 in [place] Office Visit from 04/26/2019 in [place]  Total GAD-7 Score '17 8 14 11 9      '$ PHQ2-9    [place] Counselor from  03/13/2021 in [facility] Video Visit from 01/23/2021 in [facility] Counselor from 01/09/2021 in [facility] Office Visit from 08/25/2019 in [place] Office Visit from 04/26/2019 in [place]  PHQ-2 Total Score '5 4 1 4 2  '$ PHQ-9 Total Score '13 16 7 13 5      '$ Flowsheet Row Video Visit from 01/23/2021 in [facility] Counselor from 08/22/2020 in [place] No Risk No Risk        Assessment and Plan:     1. Generalized anxiety disorder  - busPIRone (BUSPAR) 15 MG tablet; Take 1 tablet (15 mg total) by mouth 2 (two) times daily.  Dispense: 60 tablet; Refill: 1 - mirtazapine (REMERON) 15 MG tablet; Take 1 tablet (15 mg total) by mouth at bedtime.  Dispense: 30 tablet; Refill: 1  2. PTSD (post-traumatic stress disorder)  - busPIRone (BUSPAR) 15 MG tablet; Take 1 tablet (15 mg total) by mouth 2 (two) times daily.  Dispense: 60 tablet; Refill: 1 - mirtazapine (REMERON) 15 MG tablet; Take 1 tablet (15 mg total) by mouth at bedtime.  Dispense: 30 tablet; Refill: 1  3. Panic attacks  - busPIRone (BUSPAR) 15 MG tablet; Take 1 tablet (15 mg total) by mouth 2 (two) times daily.  Dispense: 60 tablet; Refill: 1 - mirtazapine (REMERON) 15 MG tablet; Take 1 tablet (15 mg total) by mouth at bedtime.  Dispense: 30 tablet; Refill: 1  4. Bipolar 2 disorder, major depressive episode (HCC)  - mirtazapine (REMERON) 15 MG tablet; Take 1 tablet (15 mg total) by mouth at bedtime.  Dispense: 30 tablet; Refill: 1 - lamoTRIgine (LAMICTAL) 100 MG tablet; Take 1 tablet (100 mg total) by mouth daily.  Dispense: 30 tablet; Refill: 1  Patient to follow up in 6 weeks Provider spent a total of 23 minutes with the patient/reviewing patient's chart  [name], PA 03/14/2021, 1:40 AM

## 2021-04-03 ENCOUNTER — Other Ambulatory Visit: Payer: Self-pay

## 2021-04-03 ENCOUNTER — Ambulatory Visit (INDEPENDENT_AMBULATORY_CARE_PROVIDER_SITE_OTHER): Payer: No Payment, Other | Admitting: Licensed Clinical Social Worker

## 2021-04-03 DIAGNOSIS — F3181 Bipolar II disorder: Secondary | ICD-10-CM

## 2021-04-03 DIAGNOSIS — F4310 Post-traumatic stress disorder, unspecified: Secondary | ICD-10-CM

## 2021-04-03 DIAGNOSIS — F411 Generalized anxiety disorder: Secondary | ICD-10-CM
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
## 2021-04-24 ENCOUNTER — Ambulatory Visit (HOSPITAL_COMMUNITY): Payer: No Payment, Other | Admitting: Licensed Clinical Social Worker

## 2021-04-25 ENCOUNTER — Encounter (HOSPITAL_COMMUNITY): Payer: Self-pay | Admitting: Physician Assistant

## 2021-05-02 ENCOUNTER — Other Ambulatory Visit: Payer: Self-pay

## 2021-05-02 ENCOUNTER — Telehealth (INDEPENDENT_AMBULATORY_CARE_PROVIDER_SITE_OTHER): Payer: No Payment, Other | Admitting: Physician Assistant

## 2021-05-02 DIAGNOSIS — F411 Generalized anxiety disorder: Secondary | ICD-10-CM | POA: Diagnosis not present

## 2021-05-02 DIAGNOSIS — F410 Panic disorder [episodic paroxysmal anxiety] without agoraphobia: Secondary | ICD-10-CM

## 2021-05-02 DIAGNOSIS — F4310 Post-traumatic stress disorder, unspecified: Secondary | ICD-10-CM | POA: Diagnosis not present

## 2021-05-02 DIAGNOSIS — F3181 Bipolar II disorder: Secondary | ICD-10-CM

## 2021-05-02 MED ORDER — LAMOTRIGINE 100 MG PO TABS: 100.0000 mg | ORAL_TABLET | Freq: Every day | ORAL | 1 refills | Status: DC

## 2021-05-02 MED ORDER — BUSPIRONE HCL 15 MG PO TABS: 15.0000 mg | ORAL_TABLET | Freq: Three times a day (TID) | ORAL | 1 refills | Status: DC

## 2021-05-02 MED ORDER — MIRTAZAPINE 30 MG PO TABS: 30.0000 mg | ORAL_TABLET | Freq: Every day | ORAL | 1 refills | Status: DC

## 2021-05-02 NOTE — Progress Notes (Addendum)
[place] MD/PA/NP OP Progress Note  Virtual Visit via Video Note  I connected with Margaret Lawson on 05/02/21 at  3:30 PM EDT by a video enabled telemedicine application and verified that I am speaking with the correct person using two identifiers.  Location: Patient: Home Provider: Clinic   I discussed the limitations of evaluation and management by telemedicine and the availability of in person appointments. The patient expressed understanding and agreed to proceed.  Follow Up Instructions:  I discussed the assessment and treatment plan with the patient. The patient was provided an opportunity to ask questions and all were answered. The patient agreed with the plan and demonstrated an understanding of the instructions.   The patient was advised to call back or seek an in-person evaluation if the symptoms worsen or if the condition fails to improve as anticipated.  I provided 25 minutes of non-face-to-face time during this encounter.  [name], PA   05/02/2021 8:53 PM Mckinsley Koelzer  MRN:  [id number]  Chief Complaint: Follow up and medication management  HPI:   Margaret Lawson is a 40 year old female with a past psychiatric history significant for bipolar 2 disorder, generalized anxiety disorder, PTSD, and panic attacks who presents to [facility] behavioral health outpatient clinic via virtual video visit for follow-up and medication management.  Patient is currently being managed on the following medications:  Lamotrigine 100 mg daily BuSpar 15 mg 2 times daily Mirtazapine 15 mg at bedtime  Patient reports no issues or concerns regarding her medication regimen.  Patient believes that her medications are doing what they need to be doing, however, patient still endorses issues with picking her skin along with OCD symptoms.  Patient has a long history of being on multiple medications for the management of her bipolar 2 disorder and anxiety.  She refuses to take  antipsychotics.  Patient expresses some exhaustion from trying to find the best medication regimen to help with her symptoms and she endorses some aggravation towards the process.  Patient states that she will be switching to a female therapist due to past trauma she has experienced.  Patient believes that if she tackles some issues from her past, then it may help with her current symptoms.  Due to the nature of her past trauma, patient states that she would feel more comfortable with talking to a female therapist.  Patient's main issue today is regarding her anxiety.  She attributes her anxiety as the reason for skin picking and her OCD symptoms.  Patient also expresses that she does not have any energy or motivation.  Patient's main stressor involves anxiety surrounding locating a job.  Patient states that she often has difficulties during the interview process when it comes to answering the simplest of questions.  Patient states that whenever she is asked a question during the interview, her brain panics and shuts down.  Patient does not understand why she is so anxious but is hopeful about medications and more therapy may be able to help.  A PHQ-9 screen was performed with the patient scoring an 18.  A GAD-7 screen was also performed with the patient scoring a 20.  Patient is alert and oriented x4, calm, cooperative, and fully engaged in conversation during the encounter.  Patient reports that she is a little anxious and worried.  Patient denies suicidal ideations but states that she occasionally has fleeting thoughts.  Patient denies homicidal ideations.  She further denies auditory or visual hallucinations and does not appear to be responding  to internal/external stimuli.  Patient endorses fair sleep and receives on average 4 to 6 hours of broken sleep each night.  Patient endorses fair appetite and eats on average 2 meals per day.  Patient denies alcohol consumption.  Patient endorses tobacco use and  smokes roughly a pack per day.  Patient endorses illicit drug use in the form of marijuana.  Visit Diagnosis:    ICD-10-CM   1. Bipolar 2 disorder, major depressive episode (HCC)  F31.81 lamoTRIgine (LAMICTAL) 100 MG tablet    mirtazapine (REMERON) 30 MG tablet    2. Generalized anxiety disorder  F41.1 busPIRone (BUSPAR) 15 MG tablet    mirtazapine (REMERON) 30 MG tablet    3. PTSD (post-traumatic stress disorder)  F43.10 busPIRone (BUSPAR) 15 MG tablet    mirtazapine (REMERON) 30 MG tablet    4. Panic attacks  F41.0 busPIRone (BUSPAR) 15 MG tablet    mirtazapine (REMERON) 30 MG tablet      Past Psychiatric History:  PTSD Panic Attacks Generalized anxiety disorders Bipolar 2 disorder  Past Medical History:  Past Medical History:  Diagnosis Date   Anxiety    severe   Asthma, chronic 12/27/2013   Bipolar 2 disorder ([place])    Essential hypertension, benign 12/27/2013   Fibromyalgia 12/27/2013   GERD (gastroesophageal reflux disease)    Headache    migraines, over stimulation   Leukocytosis 12/27/2013   Low iron    PCOS (polycystic ovarian syndrome)    Perforation of left tympanic membrane 12/27/2013   Poor dentition    right side upper and lower   Restless leg syndrome    Right leg pain    Teeth clenching    Teeth grinding    Weakness    when exposed to hot or cold temperatures    Past Surgical History:  Procedure Laterality Date   CHOLECYSTECTOMY     ROBOTIC ASSISTED TOTAL HYSTERECTOMY WITH BILATERAL SALPINGO OOPHERECTOMY Bilateral 09/10/2015   Procedure: ROBOTIC ASSISTED TOTAL HYSTERECTOMY WITH BILATERAL SALPINGECTOMY;  Surgeon: Lavonia Drafts, MD;  Location: [facility];  Service: Gynecology;  Laterality: Bilateral;   WISDOM TOOTH EXTRACTION      Family Psychiatric History:  Dad - Depression Sister - Depression  Family History:  Family History  Problem Relation Age of Onset   Aneurysm Mother        brain   Depression Father    Heart disease Father    Mitral  valve prolapse Father    Diabetes Father    Hypertension Father    Depression Sister    Breast cancer Paternal Grandfather    Breast cancer Paternal Aunt     Social History:  Social History   Socioeconomic History   Marital status: Married    Spouse name: Not on file   Number of children: Not on file   Years of education: Not on file   Highest education level: Not on file  Occupational History   Occupation: unemployed  Tobacco Use   Smoking status: Every Day    Packs/day: 1.00    Years: 20.00    Pack years: 20.00    Types: Cigarettes   Smokeless tobacco: Never   Tobacco comments:    uses 6mg  vapes  Substance and Sexual Activity   Alcohol use: No    Alcohol/week: 0.0 standard drinks   Drug use: No   Sexual activity: Yes    Partners: Male    Birth control/protection: None  Other Topics Concern   Not on file  Social  History Narrative   Not on file   Social Determinants of Health   Financial Resource Strain: Not on file  Food Insecurity: No Food Insecurity   Worried About Charity fundraiser in the Last Year: Never true   Ran Out of Food in the Last Year: Never true  Transportation Needs: Not on file  Physical Activity: Inactive   Days of Exercise per Week: 0 days   Minutes of Exercise per Session: 0 min  Stress: No Stress Concern Present   Feeling of Stress : Only a little  Social Connections: Moderately Isolated   Frequency of Communication with Friends and Family: More than three times a week   Frequency of Social Gatherings with Friends and Family: Twice a week   Attends Religious Services: Never   Marine scientist or Organizations: No   Attends Archivist Meetings: Never   Marital Status: Married    Allergies:  Allergies  Allergen Reactions   Clarithromycin Nausea And Vomiting   Adhesive [Tape] Other (See Comments)    Blisters, paper tape is worse   Ambien [Zolpidem Tartrate]     Insomnia.   Celecoxib Nausea And Vomiting    Doxycycline     Double vision    Erythromycin Nausea And Vomiting   Lyrica [Pregabalin]     Induced lactation   Morphine And Related     Decrease bp   Penicillins Hives    Has patient had a PCN reaction causing immediate rash, facial/tongue/throat swelling, SOB or lightheadedness with hypotension: Yes Has patient had a PCN reaction causing severe rash involving mucus membranes or skin necrosis: No Has patient had a PCN reaction that required hospitalization No Has patient had a PCN reaction occurring within the last 10 years: Yes If all of the above answers are "NO", then may proceed with Cephalosporin use.    Latex Rash    Metabolic Disorder Labs: Lab Results  Component Value Date   HGBA1C 5.8 (H) 06/25/2015   MPG 120 (H) 06/25/2015   No results found for: PROLACTIN Lab Results  Component Value Date   CHOL 213 (H) 02/27/2015   TRIG 210 (H) 02/27/2015   HDL 34 (L) 02/27/2015   CHOLHDL 6.3 (H) 02/27/2015   VLDL 42 (H) 02/27/2015   LDLCALC 137 (H) 02/27/2015   LDLCALC 116 (H) 01/02/2014   Lab Results  Component Value Date   TSH 1.29 11/13/2016   TSH 1.748 02/27/2015    Therapeutic Level Labs: No results found for: LITHIUM No results found for: VALPROATE No components found for:  CBMZ  Current Medications: Current Outpatient Medications  Medication Sig Dispense Refill   acetaminophen (TYLENOL) 500 MG tablet Take 500 mg by mouth every 6 (six) hours as needed for mild pain or moderate pain.     albuterol (VENTOLIN HFA) 108 (90 Base) MCG/ACT inhaler INHALE TWO PUFFS BY MOUTH EVERY 4 HOURS AS NEEDED FOR WHEEZING 18 g 3   atenolol (TENORMIN) 25 MG tablet Take 1 tablet (25 mg total) by mouth 2 (two) times daily. 180 tablet 3   busPIRone (BUSPAR) 15 MG tablet Take 1 tablet (15 mg total) by mouth 3 (three) times daily. 90 tablet 1   estradiol (ESTRACE) 1 MG tablet Take 1 tablet (1 mg total) by mouth daily. 90 tablet 1   furosemide (LASIX) 20 MG tablet Take 1 tablet (20 mg  total) by mouth daily as needed for edema. 30 tablet 3   ipratropium-albuterol (DUONEB) 0.5-2.5 (3) MG/3ML SOLN Take  3 mLs by nebulization every 2 (two) hours as needed (wheeze, SOB). 60 mL 3   lamoTRIgine (LAMICTAL) 100 MG tablet Take 1 tablet (100 mg total) by mouth daily. 30 tablet 1   MELATONIN PO Take by mouth.     methocarbamol (ROBAXIN) 500 MG tablet TAKE ONE TABLET BY MOUTH THREE TIMES A DAY 90 tablet 2   mirtazapine (REMERON) 30 MG tablet Take 1 tablet (30 mg total) by mouth at bedtime. 30 tablet 1   Multiple Vitamin (MULTIVITAMIN) capsule Take 1 capsule by mouth daily.     Nutritional Supplements (MENOPAUSE FORMULA PO) Take by mouth.     pantoprazole (PROTONIX) 40 MG tablet TAKE ONE TABLET BY MOUTH DAILY 90 tablet 2   triamterene-hydrochlorothiazide (MAXZIDE-25) 37.5-25 MG tablet Take 1 tablet by mouth daily. 90 tablet 3   No current facility-administered medications for this visit.     Musculoskeletal: Strength & Muscle Tone: Unable to assess due to telemedicine visit [place]: Unable to assess due to telemedicine visit Patient leans: Unable to assess due to telemedicine visit  Psychiatric Specialty Exam: Review of Systems  Psychiatric/Behavioral:  Positive for sleep disturbance. Negative for decreased concentration, dysphoric mood, hallucinations, self-injury and suicidal ideas. The patient is nervous/anxious. The patient is not hyperactive.    Last menstrual period 07/28/2015.There is no height or weight on file to calculate BMI.  General Appearance: Well Groomed  Eye Contact:  Good  Speech:  Clear and Coherent and Normal Rate  Volume:  Normal  Mood:  Anxious and Depressed  Affect:  Congruent, Depressed, and Tearful  Thought Process:  Coherent, Goal Directed, and Descriptions of Associations: Intact  Orientation:  Full (Time, Place, and Person)  Thought Content: WDL   Suicidal Thoughts:  No  Homicidal Thoughts:  No  Memory:  Immediate;   Good Recent;    Good Remote;   Good  Judgement:  Good  Insight:  Good  Psychomotor Activity:  Normal  Concentration:  Concentration: Good and Attention Span: Good  Recall:  Good  Fund of Knowledge: Good  Language: Good  Akathisia:  Negative  Handed:  Right  AIMS (if indicated): not done  Assets:  Communication Skills Desire for Improvement Financial Resources/Insurance Housing  ADL's:  Intact  Cognition: WNL  Sleep:  Fair   Screenings: GAD-7    Flowsheet Row Video Visit from 05/02/2021 in [facility] Counselor from 03/13/2021 in [facility] Video Visit from 01/23/2021 in [facility] Counselor from 01/18/2020 in [facility] Office Visit from 08/25/2019 in [place]  Total GAD-7 Score 20 17 8 14 11       PHQ2-9    [place] Video Visit from 05/02/2021 in [facility] Counselor from 03/13/2021 in [facility] Video Visit from 01/23/2021 in [facility] Counselor from 01/09/2021 in [facility] Office Visit from 08/25/2019 in [place]  PHQ-2 Total Score 5 5 4 1 4   PHQ-9 Total Score 18 13 16 7 13       Flowsheet Row Video Visit from 05/02/2021 in [facility] Video Visit from 01/23/2021 in [facility] Counselor from 08/22/2020 in [place] No Risk No Risk        Assessment and Plan:   Margaret Lawson is  a 40 year old female with a past psychiatric history significant for bipolar 2 disorder, generalized anxiety disorder, PTSD, and panic attacks who presents to [facility] behavioral health outpatient clinic via virtual video visit for follow-up and medication management.  Patient  is still experiencing some depressive episodes as well as anxiety and the presence of OCD symptoms.  Patient has been on a variety of medications for the management of her symptoms and refuses to be placed on antipsychotics.  Provider recommended adjusting her medications to help manage her current symptoms.  Provider recommended increasing her dosage of mirtazapine from 15 mg to 30 mg at bedtime for the management of her depressive symptoms and anxiety.  Patient was also recommended increasing her buspirone from 15 mg 2 times daily to 15 mg 3 times daily for the management of her anxiety.  Patient was agreeable to recommendations.  Patient's medications to be e-prescribed to pharmacy of choice.  1. Bipolar 2 disorder, major depressive episode (HCC)  - lamoTRIgine (LAMICTAL) 100 MG tablet; Take 1 tablet (100 mg total) by mouth daily.  Dispense: 30 tablet; Refill: 1 - mirtazapine (REMERON) 30 MG tablet; Take 1 tablet (30 mg total) by mouth at bedtime.  Dispense: 30 tablet; Refill: 1  2. Generalized anxiety disorder  - busPIRone (BUSPAR) 15 MG tablet; Take 1 tablet (15 mg total) by mouth 3 (three) times daily.  Dispense: 90 tablet; Refill: 1 - mirtazapine (REMERON) 30 MG tablet; Take 1 tablet (30 mg total) by mouth at bedtime.  Dispense: 30 tablet; Refill: 1  3. PTSD (post-traumatic stress disorder)  - busPIRone (BUSPAR) 15 MG tablet; Take 1 tablet (15 mg total) by mouth 3 (three) times daily.  Dispense: 90 tablet; Refill: 1 - mirtazapine (REMERON) 30 MG tablet; Take 1 tablet (30 mg total) by mouth at bedtime.  Dispense: 30 tablet; Refill: 1  4. Panic attacks  - busPIRone (BUSPAR) 15 MG tablet; Take 1 tablet (15 mg total) by mouth 3 (three) times daily.  Dispense: 90 tablet; Refill: 1 - mirtazapine (REMERON) 30 MG tablet; Take 1 tablet (30 mg total) by mouth at bedtime.  Dispense: 30 tablet; Refill: 1  Patient to follow up in 6 weeks Provider spent a total of 25 minutes with the  patient/reviewing patient's chart  [name], PA 05/02/2021, 8:53 PM

## 2021-05-05 ENCOUNTER — Encounter (HOSPITAL_COMMUNITY): Payer: Self-pay | Admitting: Physician Assistant

## 2021-05-21 ENCOUNTER — Ambulatory Visit (HOSPITAL_COMMUNITY): Payer: No Payment, Other | Admitting: Licensed Clinical Social Worker

## 2021-06-09 ENCOUNTER — Ambulatory Visit (INDEPENDENT_AMBULATORY_CARE_PROVIDER_SITE_OTHER): Payer: No Payment, Other | Admitting: Licensed Clinical Social Worker

## 2021-06-09 DIAGNOSIS — F411 Generalized anxiety disorder: Secondary | ICD-10-CM

## 2021-06-09 DIAGNOSIS — F330 Major depressive disorder, recurrent, mild: Secondary | ICD-10-CM

## 2021-06-10 NOTE — Progress Notes (Signed)
THERAPIST PROGRESS NOTE   Virtual Visit via Video Note  I connected with Margaret Lawson on 06/09/21 at 10:00 AM EST by a video enabled telemedicine application and verified that I am speaking with the correct person using two identifiers.  Location: Patient: Home Provider: Home   I discussed the limitations of evaluation and management by telemedicine and the availability of in person appointments. The patient expressed understanding and agreed to proceed. I discussed the assessment and treatment plan with the patient. The patient was provided an opportunity to ask questions and all were answered. The patient agreed with the plan and demonstrated an understanding of the instructions.   The patient was advised to call back or seek an in-person evaluation if the symptoms worsen or if the condition fails to improve as anticipated.  I provided 45 minutes of non-face-to-face time during this encounter.  Participation Level: Active  Behavioral Response: CasualAlertDepressed and Worthless  Type of Therapy: Individual Therapy  Treatment Goals addressed: Communication: Establishing care with female provider  Interventions: Other: Establish care  Summary: Margaret Lawson is a 40 y.o. female who presents with self reported hx of MH dx including Anx, Dep, PTSD, Bipolar II Dis, "BPD". Pt endorses anx, dep, PTSD and low self esteem, poor memory. Pt has been seen by female provider at [facility] and states she feels she needs to have a female to address some of her past traumas. Pt reports [place] services from [facility] in the past and 2 MH hospitalizations 2-3 yrs ago. Pt states she is originally from [place]. She is currently living with her husband of 12 yrs and advises this is a healthy, supportive relationship. Pt has no children. Both parents deceased. Mother died when pt 4 very suddenly from a brain aneurysm. Father died ~3 yrs ago from "a combination of things". Pt states they were in  communication when he became acutely ill and died in hosp in ~ 2 days. She states he was an alcoholic with CHF and other comorbidities. Pt reports shortly after her mother's death her best friend was in a school shooting. Pt denies much knowledge of the grief process and states she has never engaged in formal grief counseling. Pt has 2 older sisters, no brothers. She states she has "as little communication as possible" with her oldest sis and is in regular touch with other sister who is in [place]. Pt reports she has not worked in ~ 3 yrs. 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 Pt has goal of finding work. Hopes to find remote work as she states she has increased anx when out in public. LCSW provided referral to [facility] for added support r/t to work and self worth. Additional exploration of social anx reveals pt saying "There is a lot of craziness in the world" and advises she worries for her safety when out in public. Encouraged pt to minimize watching the news which she concurs with. Today pt scores 5 on PQH screening and states her Anx is "4" on 0-10 scale. Pt states she is taking meds as prescribed and reports last adjustment was very effective in helping/managing her MH symptoms. Pt is asked to make a prioritized list of past difficulties she wishes to process in ongoing counseling. LCSW reviewed poc including scheduling prior to  close of session. Pt states appreciation for care.         Suicidal/Homicidal: Nowithout intent/plan  Therapist Response: Pt receptive to ongoing counseling with new provider.  Plan: Return again for next avail appt.  Diagnosis: Axis I: Generalized Anxiety Disorder and MDD, mild   Hermine Messick, LCSW 06/10/2021

## 2021-06-12 ENCOUNTER — Telehealth (INDEPENDENT_AMBULATORY_CARE_PROVIDER_SITE_OTHER): Payer: No Payment, Other | Admitting: Physician Assistant

## 2021-06-12 ENCOUNTER — Encounter (HOSPITAL_COMMUNITY): Payer: Self-pay | Admitting: Physician Assistant

## 2021-06-12 DIAGNOSIS — F3181 Bipolar II disorder: Secondary | ICD-10-CM

## 2021-06-12 DIAGNOSIS — F410 Panic disorder [episodic paroxysmal anxiety] without agoraphobia: Secondary | ICD-10-CM | POA: Diagnosis not present

## 2021-06-12 DIAGNOSIS — F411 Generalized anxiety disorder: Secondary | ICD-10-CM

## 2021-06-12 DIAGNOSIS — F4310 Post-traumatic stress disorder, unspecified: Secondary | ICD-10-CM | POA: Diagnosis not present

## 2021-06-12 MED ORDER — BUSPIRONE HCL 15 MG PO TABS: 15.0000 mg | ORAL_TABLET | Freq: Three times a day (TID) | ORAL | 1 refills | Status: DC

## 2021-06-12 MED ORDER — MIRTAZAPINE 30 MG PO TABS: 30.0000 mg | ORAL_TABLET | Freq: Every day | ORAL | 1 refills | Status: DC

## 2021-06-12 MED ORDER — LAMOTRIGINE 100 MG PO TABS: 100.0000 mg | ORAL_TABLET | Freq: Every day | ORAL | 1 refills | Status: DC

## 2021-06-12 NOTE — Progress Notes (Addendum)
[place] MD/PA/NP OP Progress Note  Virtual Visit via Video Note  I connected with Margaret Lawson on 06/15/21 at  5:00 PM EST by a video enabled telemedicine application and verified that I am speaking with the correct person using two identifiers.  Location: Patient: Home Provider: Clinic   I discussed the limitations of evaluation and management by telemedicine and the availability of in person appointments. The patient expressed understanding and agreed to proceed.  Follow Up Instructions:   I discussed the assessment and treatment plan with the patient. The patient was provided an opportunity to ask questions and all were answered. The patient agreed with the plan and demonstrated an understanding of the instructions.   The patient was advised to call back or seek an in-person evaluation if the symptoms worsen or if the condition fails to improve as anticipated.  I provided 18 minutes of non-face-to-face time during this encounter.  Margaret Mood, PA   06/12/2021 5:23 PM Margaret Lawson  MRN:  [id number]  Chief Complaint: Follow up and medication management   HPI:   Margaret Lawson is a 40 year old female with a past psychiatric history significant for bipolar 2 disorder, generalized anxiety disorder, PTSD, and panic attacks who presents to [facility] via virtual video visit for follow-up and medication management.  Patient is currently being managed on the following medications:  Mirtazapine 30 mg daily at bedtime Lamotrigine 100 mg daily BuSpar 15 mg 3 times daily  Patient reports no issues or concerns regarding her current medication regimen.  Patient denies the need for dosage adjustments at this time and is requesting refills on all her medications following the conclusion of the encounter.  Despite having an ear infection within the last 2 weeks, patient states that she has been doing pretty well.  She reports meeting with  her new counselor stating that the things during her last session went well, however, she does not have an appointment until roughly a month.  Patient states that she was given the task of prioritizing her issues prior to presenting to her next therapy session.  Patient states that she has experienced depressive episodes within the last couple of days.  She attributes these depressive episodes to scheduling changes with her husband's job and that it is starting to get darker sooner.  Patient states that she feels more "blue" rather than depressed.  Patient endorses anxiety, however, and has been manageable.  Patient states that her anxiety has been managed through following: deep breathing exercises, drinking tea, and going out a couple of times during the week.  She denies experiencing any panic attacks as of late.  A PHQ-9 screen was performed with the patient scoring a 14.  Get was also performed with the patient scoring a 15.  Patient is alert and oriented x4, pleasant, calm, cooperative, and fully engaged in conversation during the encounter.  Patient endorses being in a pleasant Lawson.  Patient denies suicidal or homicidal ideations.  She further denies auditory or visual hallucinations and does not appear to be responding to internal/external stimuli.  Patient endorses fair sleep and receives on average 5 to 6 hours per night.  Patient endorses fair appetite and eats on average 2 meals per day.  Patient denies alcohol consumption and illicit drug use.  Patient endorses tobacco use and smokes on average a pack per day.  Visit Diagnosis:    ICD-10-CM   1. Generalized anxiety disorder  F41.1 busPIRone (BUSPAR) 15 MG tablet  mirtazapine (REMERON) 30 MG tablet    2. PTSD (post-traumatic stress disorder)  F43.10 busPIRone (BUSPAR) 15 MG tablet    mirtazapine (REMERON) 30 MG tablet    3. Panic attacks  F41.0 busPIRone (BUSPAR) 15 MG tablet    mirtazapine (REMERON) 30 MG tablet    4. Bipolar 2  disorder, major depressive episode (HCC)  F31.81 mirtazapine (REMERON) 30 MG tablet    lamoTRIgine (LAMICTAL) 100 MG tablet      Past Psychiatric History:  PTSD Panic Attacks Generalized anxiety disorders Bipolar 2 disorder  Past Medical History:  Past Medical History:  Diagnosis Date   Anxiety    severe   Asthma, chronic 12/27/2013   Bipolar 2 disorder ([place])    Essential hypertension, benign 12/27/2013   Fibromyalgia 12/27/2013   GERD (gastroesophageal reflux disease)    Headache    migraines, over stimulation   Leukocytosis 12/27/2013   Low iron    PCOS (polycystic ovarian syndrome)    Perforation of left tympanic membrane 12/27/2013   Poor dentition    right side upper and lower   Restless leg syndrome    Right leg pain    Teeth clenching    Teeth grinding    Weakness    when exposed to hot or cold temperatures    Past Surgical History:  Procedure Laterality Date   CHOLECYSTECTOMY     ROBOTIC ASSISTED TOTAL HYSTERECTOMY WITH BILATERAL SALPINGO OOPHERECTOMY Bilateral 09/10/2015   Procedure: ROBOTIC ASSISTED TOTAL HYSTERECTOMY WITH BILATERAL SALPINGECTOMY;  Surgeon: Lavonia Drafts, MD;  Location: [facility];  Service: Gynecology;  Laterality: Bilateral;   WISDOM TOOTH EXTRACTION      Family Psychiatric History:  Dad - Depression Sister - Depression  Family History:  Family History  Problem Relation Age of Onset   Aneurysm Mother        brain   Depression Father    Heart disease Father    Mitral valve prolapse Father    Diabetes Father    Hypertension Father    Depression Sister    Breast cancer Paternal Grandfather    Breast cancer Paternal Aunt     Social History:  Social History   Socioeconomic History   Marital status: Married    Spouse name: Not on file   Number of children: Not on file   Years of education: Not on file   Highest education level: Not on file  Occupational History   Occupation: unemployed  Tobacco Use   Smoking status: Every Day     Packs/day: 1.00    Years: 20.00    Pack years: 20.00    Types: Cigarettes   Smokeless tobacco: Never   Tobacco comments:    uses 6mg  vapes  Substance and Sexual Activity   Alcohol use: No    Alcohol/week: 0.0 standard drinks   Drug use: No   Sexual activity: Yes    Partners: Male    Birth control/protection: None  Other Topics Concern   Not on file  Social History Narrative   Not on file   Social Determinants of Health   Financial Resource Strain: Not on file  Food Insecurity: No Food Insecurity   Worried About Charity fundraiser in the Last Year: Never true   Ran Out of Food in the Last Year: Never true  Transportation Needs: Not on file  Physical Activity: Inactive   Days of Exercise per Week: 0 days   Minutes of Exercise per Session: 0 min  Stress: No Stress  Concern Present   Feeling of Stress : Only a little  Social Connections: Moderately Isolated   Frequency of Communication with Friends and Family: More than three times a week   Frequency of Social Gatherings with Friends and Family: Twice a week   Attends Religious Services: Never   Marine scientist or Organizations: No   Attends Archivist Meetings: Never   Marital Status: Married    Allergies:  Allergies  Allergen Reactions   Clarithromycin Nausea And Vomiting   Adhesive [Tape] Other (See Comments)    Blisters, paper tape is worse   Ambien [Zolpidem Tartrate]     Insomnia.   Celecoxib Nausea And Vomiting   Doxycycline     Double vision    Erythromycin Nausea And Vomiting   Lyrica [Pregabalin]     Induced lactation   Morphine And Related     Decrease bp   Penicillins Hives    Has patient had a PCN reaction causing immediate rash, facial/tongue/throat swelling, SOB or lightheadedness with hypotension: Yes Has patient had a PCN reaction causing severe rash involving mucus membranes or skin necrosis: No Has patient had a PCN reaction that required hospitalization No Has patient  had a PCN reaction occurring within the last 10 years: Yes If all of the above answers are "NO", then may proceed with Cephalosporin use.    Latex Rash    Metabolic Disorder Labs: Lab Results  Component Value Date   HGBA1C 5.8 (H) 06/25/2015   MPG 120 (H) 06/25/2015   No results found for: PROLACTIN Lab Results  Component Value Date   CHOL 213 (H) 02/27/2015   TRIG 210 (H) 02/27/2015   HDL 34 (L) 02/27/2015   CHOLHDL 6.3 (H) 02/27/2015   VLDL 42 (H) 02/27/2015   LDLCALC 137 (H) 02/27/2015   LDLCALC 116 (H) 01/02/2014   Lab Results  Component Value Date   TSH 1.29 11/13/2016   TSH 1.748 02/27/2015    Therapeutic Level Labs: No results found for: LITHIUM No results found for: VALPROATE No components found for:  CBMZ  Current Medications: Current Outpatient Medications  Medication Sig Dispense Refill   acetaminophen (TYLENOL) 500 MG tablet Take 500 mg by mouth every 6 (six) hours as needed for mild pain or moderate pain.     albuterol (VENTOLIN HFA) 108 (90 Base) MCG/ACT inhaler INHALE TWO PUFFS BY MOUTH EVERY 4 HOURS AS NEEDED FOR WHEEZING 18 g 3   atenolol (TENORMIN) 25 MG tablet Take 1 tablet (25 mg total) by mouth 2 (two) times daily. 180 tablet 3   busPIRone (BUSPAR) 15 MG tablet Take 1 tablet (15 mg total) by mouth 3 (three) times daily. 90 tablet 1   estradiol (ESTRACE) 1 MG tablet Take 1 tablet (1 mg total) by mouth daily. 90 tablet 1   furosemide (LASIX) 20 MG tablet Take 1 tablet (20 mg total) by mouth daily as needed for edema. 30 tablet 3   ipratropium-albuterol (DUONEB) 0.5-2.5 (3) MG/3ML SOLN Take 3 mLs by nebulization every 2 (two) hours as needed (wheeze, SOB). 60 mL 3   lamoTRIgine (LAMICTAL) 100 MG tablet Take 1 tablet (100 mg total) by mouth daily. 30 tablet 1   MELATONIN PO Take by mouth.     methocarbamol (ROBAXIN) 500 MG tablet TAKE ONE TABLET BY MOUTH THREE TIMES A DAY 90 tablet 2   mirtazapine (REMERON) 30 MG tablet Take 1 tablet (30 mg total) by  mouth at bedtime. 30 tablet 1   Multiple  Vitamin (MULTIVITAMIN) capsule Take 1 capsule by mouth daily.     Nutritional Supplements (MENOPAUSE FORMULA PO) Take by mouth.     pantoprazole (PROTONIX) 40 MG tablet TAKE ONE TABLET BY MOUTH DAILY 90 tablet 2   triamterene-hydrochlorothiazide (MAXZIDE-25) 37.5-25 MG tablet Take 1 tablet by mouth daily. 90 tablet 3   No current facility-administered medications for this visit.     Musculoskeletal: Strength & Muscle Tone: Unable to assess due to telemedicine visit [place]: Unable to assess due to telemedicine visit Patient leans: Unable to assess due to telemedicine visit  Psychiatric Specialty Exam: Review of Systems  Psychiatric/Behavioral:  Positive for sleep disturbance. Negative for decreased concentration, dysphoric Lawson, hallucinations, self-injury and suicidal ideas. The patient is nervous/anxious. The patient is not hyperactive.    Last menstrual period 07/28/2015.There is no height or weight on file to calculate BMI.  General Appearance: Well Groomed  Eye Contact:  Good  Speech:  Anxious and Euthymic  Volume:  Normal  Lawson:  Anxious and Euthymic  Affect:  Appropriate and Congruent  Thought Process:  Coherent and Descriptions of Associations: Intact  Orientation:  Full (Time, Place, and Person)  Thought Content: WDL   Suicidal Thoughts:  No  Homicidal Thoughts:  No  Memory:  Immediate;   Good Recent;   Good Remote;   Good  Judgement:  Good  Insight:  Good  Psychomotor Activity:  Normal  Concentration:  Concentration: Good and Attention Span: Good  Recall:  Good  Fund of Knowledge: Good  Language: Good  Akathisia:  Negative  Handed:  Right  AIMS (if indicated): not done  Assets:  Communication Skills Desire for Improvement Financial Resources/Insurance Housing  ADL's:  Intact  Cognition: WNL  Sleep:  Fair   Screenings: GAD-7    Flowsheet Row Video Visit from 06/12/2021 in [facility] Video Visit from 05/02/2021 in [facility] Counselor from 03/13/2021 in [facility] Video Visit from 01/23/2021 in [facility] Counselor from 01/18/2020 in [facility]  Total GAD-7 Score 15 20 17 8 14       PHQ2-9    Flowsheet Row Video Visit from 06/12/2021 in [facility] Counselor from 06/09/2021 in [facility] Video Visit from 05/02/2021 in [facility] Counselor from 03/13/2021 in [facility] Video Visit from 01/23/2021 in [place]  PHQ-2 Total Score 3 2 5 5 4   PHQ-9 Total Score 14 5 18 13 16       Flowsheet Row Video Visit from 06/12/2021 in [place] Video Visit from 05/02/2021 in [facility] Video Visit from 01/23/2021 in [place] No Risk Low Risk No Risk        Assessment and Plan:   Margaret Lawson is a 40 year old female with a past psychiatric history significant for bipolar 2 disorder, generalized anxiety disorder, PTSD, and panic attacks who presents to [facility] via virtual video visit for follow-up and medication management.  Patient endorses improvement in her depression and anxiety through the adjustments in her medications and use of coping skills.  Patient denies the need for dosage adjustments at this time and is requesting refills on all her medications following the conclusion of the encounter.  1. Generalized anxiety disorder  - busPIRone (BUSPAR) 15 MG  tablet; Take 1 tablet (15 mg total) by mouth 3 (three) times daily.  Dispense: 90 tablet; Refill: 1 - mirtazapine (REMERON) 30 MG tablet; Take 1 tablet (30 mg total) by mouth at bedtime.  Dispense: 30  tablet; Refill: 1  2. PTSD (post-traumatic stress disorder)  - busPIRone (BUSPAR) 15 MG tablet; Take 1 tablet (15 mg total) by mouth 3 (three) times daily.  Dispense: 90 tablet; Refill: 1 - mirtazapine (REMERON) 30 MG tablet; Take 1 tablet (30 mg total) by mouth at bedtime.  Dispense: 30 tablet; Refill: 1  3. Panic attacks  - busPIRone (BUSPAR) 15 MG tablet; Take 1 tablet (15 mg total) by mouth 3 (three) times daily.  Dispense: 90 tablet; Refill: 1 - mirtazapine (REMERON) 30 MG tablet; Take 1 tablet (30 mg total) by mouth at bedtime.  Dispense: 30 tablet; Refill: 1  4. Bipolar 2 disorder, major depressive episode (HCC)  - mirtazapine (REMERON) 30 MG tablet; Take 1 tablet (30 mg total) by mouth at bedtime.  Dispense: 30 tablet; Refill: 1 - lamoTRIgine (LAMICTAL) 100 MG tablet; Take 1 tablet (100 mg total) by mouth daily.  Dispense: 30 tablet; Refill: 1  Patient to follow up in 2 months Provider spent a total of 18 minutes with the patient/reviewing patient's chart  Margaret Mood, PA 06/12/2021, 5:23 PM

## 2021-07-07 ENCOUNTER — Other Ambulatory Visit: Payer: Self-pay | Admitting: Physician Assistant

## 2021-08-04 ENCOUNTER — Other Ambulatory Visit: Payer: Self-pay | Admitting: Physician Assistant

## 2021-08-04 DIAGNOSIS — Z5181 Encounter for therapeutic drug level monitoring: Secondary | ICD-10-CM

## 2021-08-04 DIAGNOSIS — Z79890 Hormone replacement therapy (postmenopausal): Secondary | ICD-10-CM

## 2021-08-13 ENCOUNTER — Telehealth (INDEPENDENT_AMBULATORY_CARE_PROVIDER_SITE_OTHER): Payer: No Payment, Other | Admitting: Physician Assistant

## 2021-08-13 ENCOUNTER — Encounter (HOSPITAL_COMMUNITY): Payer: Self-pay | Admitting: Physician Assistant

## 2021-08-13 DIAGNOSIS — F411 Generalized anxiety disorder: Secondary | ICD-10-CM

## 2021-08-13 DIAGNOSIS — F4310 Post-traumatic stress disorder, unspecified: Secondary | ICD-10-CM | POA: Diagnosis not present

## 2021-08-13 DIAGNOSIS — F410 Panic disorder [episodic paroxysmal anxiety] without agoraphobia: Secondary | ICD-10-CM

## 2021-08-13 DIAGNOSIS — F3181 Bipolar II disorder: Secondary | ICD-10-CM | POA: Diagnosis not present

## 2021-08-13 MED ORDER — MIRTAZAPINE 30 MG PO TABS: 30.0000 mg | ORAL_TABLET | Freq: Every day | ORAL | 1 refills | Status: DC

## 2021-08-13 MED ORDER — BUSPIRONE HCL 15 MG PO TABS: 15.0000 mg | ORAL_TABLET | Freq: Three times a day (TID) | ORAL | 1 refills | Status: DC

## 2021-08-13 MED ORDER — LAMOTRIGINE 100 MG PO TABS: 100.0000 mg | ORAL_TABLET | Freq: Every day | ORAL | 1 refills | Status: DC

## 2021-08-13 NOTE — Progress Notes (Signed)
[facility] MD/PA/NP OP Progress Note  Virtual Visit via Video Note  I connected with Margaret Lawson on 08/13/21 at 11:30 AM EST by a video enabled telemedicine application and verified that I am speaking with the correct person using two identifiers.  Location: Patient: Home Provider: Clinic   I discussed the limitations of evaluation and management by telemedicine and the availability of   Follow Up Instructions:  I discussed the assessment and treatment plan with the patient. The patient was provided an opportunity to ask questions and all were answered. The patient agreed with the plan and demonstrated an understanding of the instructions.   The patient was advised to call back or seek an in-person evaluation if the symptoms worsen or if the condition fails to improve as anticipated.  I provided 17 minutes of non-face-to-face time during this encounter.  [name], PA   08/13/2021 4:56 PM [name]  MRN:  [id number]  Chief Complaint: Follow up and medication management  HPI:   Margaret Lawson is a 41 year old female with a past psychiatric history significant for generalized anxiety disorder, PTSD, panic attacks, and bipolar 2 disorder who presents to [facility] via virtual video visit for follow-up and medication management.  Patient is currently being managed on the following medications:  Buspirone 15 mg 3 times daily Mirtazapine 30 mg at bedtime Lamotrigine 100 mg daily  Patient reports that she is doing okay overall.  She reports that last 2 weeks, there were a couple of days where she felt down.  Patient believes that her low moments during those past weeks were due to the weather.  During those low periods, patient endorsed the following symptoms: Restlessness, crying spells, low self-esteem, lack of motivation, and difficulty interacting socially.  She notes that since the weather has improved, her mood has also  improved slightly.  She states that yesterday was still rough for her so in order to alleviate her low mood patient got up and clean the house.  She reports that even after cleaning, she still experienced some low mood.  Patient reports that she is doing much better today.  She reports that she was able to get up and do the majority of her activities of daily living.  She believes that her current medication regimen are not at fault for her low mood.  She reports that she feels confident that she can pull through on days where she feels depressed. A PHQ-9 screen was performed with the patient scoring in 10.  A GAD-7 screen was also performed with the patient scoring a 13.  Patient is alert and oriented x4, pleasant, calm, cooperative, and fully engaged in conversation during the encounter.  Patient endorses good mood.  Patient denies suicidal or homicidal ideation.  She further denies auditory or visual hallucinations does not appear to be responding to internal/external stimuli.  Patient endorses good sleep and receives on average 6 hours of sleep each night.  Patient endorses good appetite and eats on average 2 meals per day.  Patient endorses minimal alcohol consumption.  She endorses tobacco use and smokes on average a pack per day.  Patient denies illicit drug use.  Visit Diagnosis:    ICD-10-CM   1. Generalized anxiety disorder  F41.1 mirtazapine (REMERON) 30 MG tablet    busPIRone (BUSPAR) 15 MG tablet    2. PTSD (post-traumatic stress disorder)  F43.10 mirtazapine (REMERON) 30 MG tablet    busPIRone (BUSPAR) 15 MG tablet    3.  Panic attacks  F41.0 mirtazapine (REMERON) 30 MG tablet    busPIRone (BUSPAR) 15 MG tablet    4. Bipolar 2 disorder, major depressive episode (HCC)  F31.81 mirtazapine (REMERON) 30 MG tablet    lamoTRIgine (LAMICTAL) 100 MG tablet      Past Psychiatric History:  PTSD Panic Attacks Generalized anxiety disorders Bipolar 2 disorder  Past Medical History:  Past  Medical History:  Diagnosis Date   Anxiety    severe   Asthma, chronic 12/27/2013   Bipolar 2 disorder ([place])    Essential hypertension, benign 12/27/2013   Fibromyalgia 12/27/2013   GERD (gastroesophageal reflux disease)    Headache    migraines, over stimulation   Leukocytosis 12/27/2013   Low iron    PCOS (polycystic ovarian syndrome)    Perforation of left tympanic membrane 12/27/2013   Poor dentition    right side upper and lower   Restless leg syndrome    Right leg pain    Teeth clenching    Teeth grinding    Weakness    when exposed to hot or cold temperatures    Past Surgical History:  Procedure Laterality Date   CHOLECYSTECTOMY     ROBOTIC ASSISTED TOTAL HYSTERECTOMY WITH BILATERAL SALPINGO OOPHERECTOMY Bilateral 09/10/2015   Procedure: ROBOTIC ASSISTED TOTAL HYSTERECTOMY WITH BILATERAL SALPINGECTOMY;  Surgeon: Lavonia Drafts, MD;  Location: [facility];  Service: Gynecology;  Laterality: Bilateral;   WISDOM TOOTH EXTRACTION      Family Psychiatric History:  Dad - Depression Sister - Depression  Family History:  Family History  Problem Relation Age of Onset   Aneurysm Mother        brain   Depression Father    Heart disease Father    Mitral valve prolapse Father    Diabetes Father    Hypertension Father    Depression Sister    Breast cancer Paternal Grandfather    Breast cancer Paternal Aunt     Social History:  Social History   Socioeconomic History   Marital status: Married    Spouse name: Not on file   Number of children: Not on file   Years of education: Not on file   Highest education level: Not on file  Occupational History   Occupation: unemployed  Tobacco Use   Smoking status: Every Day    Packs/day: 1.00    Years: 20.00    Pack years: 20.00    Types: Cigarettes   Smokeless tobacco: Never   Tobacco comments:    uses 6mg  vapes  Substance and Sexual Activity   Alcohol use: No    Alcohol/week: 0.0 standard drinks   Drug use: No   Sexual  activity: Yes    Partners: Male    Birth control/protection: None  Other Topics Concern   Not on file  Social History Narrative   Not on file   Social Determinants of Health   Financial Resource Strain: Not on file  Food Insecurity: No Food Insecurity   Worried About Charity fundraiser in the Last Year: Never true   Ran Out of Food in the Last Year: Never true  Transportation Needs: Not on file  Physical Activity: Inactive   Days of Exercise per Week: 0 days   Minutes of Exercise per Session: 0 min  Stress: No Stress Concern Present   Feeling of Stress : Only a little  Social Connections: Moderately Isolated   Frequency of Communication with Friends and Family: More than three times a week  Frequency of Social Gatherings with Friends and Family: Twice a week   Attends Religious Services: Never   Marine scientist or Organizations: No   Attends Archivist Meetings: Never   Marital Status: Married    Allergies:  Allergies  Allergen Reactions   Clarithromycin Nausea And Vomiting   Adhesive [Tape] Other (See Comments)    Blisters, paper tape is worse   Ambien [Zolpidem Tartrate]     Insomnia.   Celecoxib Nausea And Vomiting   Doxycycline     Double vision    Erythromycin Nausea And Vomiting   Lyrica [Pregabalin]     Induced lactation   Morphine And Related     Decrease bp   Penicillins Hives    Has patient had a PCN reaction causing immediate rash, facial/tongue/throat swelling, SOB or lightheadedness with hypotension: Yes Has patient had a PCN reaction causing severe rash involving mucus membranes or skin necrosis: No Has patient had a PCN reaction that required hospitalization No Has patient had a PCN reaction occurring within the last 10 years: Yes If all of the above answers are "NO", then may proceed with Cephalosporin use.    Latex Rash    Metabolic Disorder Labs: Lab Results  Component Value Date   HGBA1C 5.8 (H) 06/25/2015   MPG 120 (H)  06/25/2015   No results found for: PROLACTIN Lab Results  Component Value Date   CHOL 213 (H) 02/27/2015   TRIG 210 (H) 02/27/2015   HDL 34 (L) 02/27/2015   CHOLHDL 6.3 (H) 02/27/2015   VLDL 42 (H) 02/27/2015   LDLCALC 137 (H) 02/27/2015   LDLCALC 116 (H) 01/02/2014   Lab Results  Component Value Date   TSH 1.29 11/13/2016   TSH 1.748 02/27/2015    Therapeutic Level Labs: No results found for: LITHIUM No results found for: VALPROATE No components found for:  CBMZ  Current Medications: Current Outpatient Medications  Medication Sig Dispense Refill   acetaminophen (TYLENOL) 500 MG tablet Take 500 mg by mouth every 6 (six) hours as needed for mild pain or moderate pain.     albuterol (VENTOLIN HFA) 108 (90 Base) MCG/ACT inhaler INHALE TWO PUFFS BY MOUTH EVERY 4 HOURS AS NEEDED FOR WHEEZING 18 g 3   atenolol (TENORMIN) 25 MG tablet Take 1 tablet (25 mg total) by mouth 2 (two) times daily. 180 tablet 3   busPIRone (BUSPAR) 15 MG tablet Take 1 tablet (15 mg total) by mouth 3 (three) times daily. 90 tablet 1   estradiol (ESTRACE) 1 MG tablet Take 1 tablet (1 mg total) by mouth daily. NEEDS APPT FOR REFILLS 90 tablet 0   furosemide (LASIX) 20 MG tablet Take 1 tablet (20 mg total) by mouth daily as needed for edema. 30 tablet 3   ipratropium-albuterol (DUONEB) 0.5-2.5 (3) MG/3ML SOLN Take 3 mLs by nebulization every 2 (two) hours as needed (wheeze, SOB). 60 mL 3   lamoTRIgine (LAMICTAL) 100 MG tablet Take 1 tablet (100 mg total) by mouth daily. 30 tablet 1   MELATONIN PO Take by mouth.     methocarbamol (ROBAXIN) 500 MG tablet TAKE ONE TABLET BY MOUTH THREE TIMES A DAY 90 tablet 2   mirtazapine (REMERON) 30 MG tablet Take 1 tablet (30 mg total) by mouth at bedtime. 30 tablet 1   Multiple Vitamin (MULTIVITAMIN) capsule Take 1 capsule by mouth daily.     Nutritional Supplements (MENOPAUSE FORMULA PO) Take by mouth.     pantoprazole (PROTONIX) 40 MG tablet  TAKE ONE TABLET BY MOUTH DAILY  90 tablet 2   triamterene-hydrochlorothiazide (MAXZIDE-25) 37.5-25 MG tablet Take 1 tablet by mouth daily. 90 tablet 3   No current facility-administered medications for this visit.     Musculoskeletal: Strength & Muscle Tone: Unable to assess due to telemedicine visit [place]: Unable to assess due to telemedicine visit Patient leans: Unable to assess due to telemedicine visit  Psychiatric Specialty Exam: Review of Systems  Psychiatric/Behavioral:  Positive for sleep disturbance. Negative for decreased concentration, dysphoric mood, hallucinations, self-injury and suicidal ideas. The patient is nervous/anxious. The patient is not hyperactive.    Last menstrual period 07/28/2015.There is no height or weight on file to calculate BMI.  General Appearance: Unable to assess due to telemedicine visit  Eye Contact:  Unable to assess due to telemedicine visit  Speech:  Clear and Coherent and Normal Rate  Volume:  Normal  Mood:  Anxious and Depressed  Affect:  Congruent  Thought Process:  Coherent and Descriptions of Associations: Intact  Orientation:  Full (Time, Place, and Person)  Thought Content: WDL   Suicidal Thoughts:  No  Homicidal Thoughts:  No  Memory:  Immediate;   Good Recent;   Good Remote;   Good  Judgement:  Good  Insight:  Good  Psychomotor Activity:  Normal  Concentration:  Concentration: Good and Attention Span: Good  Recall:  Good  Fund of Knowledge: Good  Language: Good  Akathisia:  Negative  Handed:  Right  AIMS (if indicated): not done  Assets:  Communication Skills Desire for Improvement Financial Resources/Insurance Housing  ADL's:  Intact  Cognition: WNL  Sleep:  Fair   Screenings: GAD-7    Flowsheet Row Video Visit from 08/13/2021 in [facility] Video Visit from 06/12/2021 in [facility] Video Visit from 05/02/2021 in [facility] Counselor from 03/13/2021 in  [facility] Video Visit from 01/23/2021 in [facility]  Total GAD-7 Score 13 15 20 17 8       PHQ2-9    Flowsheet Row Video Visit from 08/13/2021 in [facility] Video Visit from 06/12/2021 in [facility] Counselor from 06/09/2021 in [facility] Video Visit from 05/02/2021 in [facility] Counselor from 03/13/2021 in [place]  PHQ-2 Total Score 3 3 2 5 5   PHQ-9 Total Score 10 14 5 18 13       Flowsheet Row Video Visit from 08/13/2021 in [facility] Video Visit from 06/12/2021 in [facility] Video Visit from 05/02/2021 in [place] No Risk No Risk Low Risk        Assessment and Plan:   Margaret Lawson is a 41 year old female with a past psychiatric history significant for generalized anxiety disorder, PTSD, panic attacks, and bipolar 2 disorder who presents to [facility] via virtual video visit for follow-up and medication management.  Patient presents to the encounter endorsing good mood but states that the last couple weeks she had a couple of episodes where she experienced low mood, restlessness, crying spells, lack of motivation, and low self-esteem.  Patient denies the need for dosage adjustments in regards to her past depressive episodes and is confident that she can pull through whenever she is experiencing a depressive episode.  Patient is requesting refills on  all her medications following the conclusion of the encounter.  Patient's medications to be e-prescribed to pharmacy of choice.  1. Generalized anxiety disorder  - mirtazapine (REMERON) 30 MG tablet; Take 1 tablet (30 mg total) by mouth at bedtime.  Dispense: 30 tablet; Refill: 1 -  busPIRone (BUSPAR) 15 MG tablet; Take 1 tablet (15 mg total) by mouth 3 (three) times daily.  Dispense: 90 tablet; Refill: 1  2. PTSD (post-traumatic stress disorder)  - mirtazapine (REMERON) 30 MG tablet; Take 1 tablet (30 mg total) by mouth at bedtime.  Dispense: 30 tablet; Refill: 1 - busPIRone (BUSPAR) 15 MG tablet; Take 1 tablet (15 mg total) by mouth 3 (three) times daily.  Dispense: 90 tablet; Refill: 1  3. Panic attacks  - mirtazapine (REMERON) 30 MG tablet; Take 1 tablet (30 mg total) by mouth at bedtime.  Dispense: 30 tablet; Refill: 1 - busPIRone (BUSPAR) 15 MG tablet; Take 1 tablet (15 mg total) by mouth 3 (three) times daily.  Dispense: 90 tablet; Refill: 1  4. Bipolar 2 disorder, major depressive episode (HCC)  - mirtazapine (REMERON) 30 MG tablet; Take 1 tablet (30 mg total) by mouth at bedtime.  Dispense: 30 tablet; Refill: 1 - lamoTRIgine (LAMICTAL) 100 MG tablet; Take 1 tablet (100 mg total) by mouth daily.  Dispense: 30 tablet; Refill: 1  Patient to follow up in 2 months Provider spent a total of 17 minutes with the patient/reviewing patient's chart  [name], PA 08/13/2021, 4:56 PM

## 2021-08-21 ENCOUNTER — Ambulatory Visit (INDEPENDENT_AMBULATORY_CARE_PROVIDER_SITE_OTHER): Payer: No Payment, Other | Admitting: Licensed Clinical Social Worker

## 2021-08-21 DIAGNOSIS — F411 Generalized anxiety disorder: Secondary | ICD-10-CM | POA: Diagnosis not present
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
## 2021-09-10 ENCOUNTER — Ambulatory Visit (INDEPENDENT_AMBULATORY_CARE_PROVIDER_SITE_OTHER): Payer: No Payment, Other | Admitting: Licensed Clinical Social Worker

## 2021-09-10 DIAGNOSIS — F411 Generalized anxiety disorder: Secondary | ICD-10-CM
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
## 2021-09-24 ENCOUNTER — Ambulatory Visit (INDEPENDENT_AMBULATORY_CARE_PROVIDER_SITE_OTHER): Payer: No Payment, Other | Admitting: Licensed Clinical Social Worker

## 2021-09-24 DIAGNOSIS — F411 Generalized anxiety disorder: Secondary | ICD-10-CM | POA: Diagnosis not present
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
## 2021-10-10 ENCOUNTER — Encounter (HOSPITAL_COMMUNITY): Payer: Self-pay | Admitting: Physician Assistant

## 2021-10-10 ENCOUNTER — Telehealth (INDEPENDENT_AMBULATORY_CARE_PROVIDER_SITE_OTHER): Payer: BLUE CROSS/BLUE SHIELD | Admitting: Physician Assistant

## 2021-10-10 DIAGNOSIS — F4310 Post-traumatic stress disorder, unspecified: Secondary | ICD-10-CM | POA: Diagnosis not present

## 2021-10-10 DIAGNOSIS — F410 Panic disorder [episodic paroxysmal anxiety] without agoraphobia: Secondary | ICD-10-CM

## 2021-10-10 DIAGNOSIS — F411 Generalized anxiety disorder: Secondary | ICD-10-CM | POA: Diagnosis not present

## 2021-10-10 DIAGNOSIS — F3181 Bipolar II disorder: Secondary | ICD-10-CM | POA: Diagnosis not present

## 2021-10-10 MED ORDER — LAMOTRIGINE 100 MG PO TABS: 100.0000 mg | ORAL_TABLET | Freq: Every day | ORAL | 2 refills | Status: DC

## 2021-10-10 MED ORDER — BUSPIRONE HCL 15 MG PO TABS: 15.0000 mg | ORAL_TABLET | Freq: Three times a day (TID) | ORAL | 2 refills | Status: DC

## 2021-10-10 MED ORDER — MIRTAZAPINE 30 MG PO TABS: 30.0000 mg | ORAL_TABLET | Freq: Every day | ORAL | 2 refills | Status: DC

## 2021-10-10 NOTE — Progress Notes (Addendum)
BH MD/PA/NP OP Progress Note ? ?Virtual Visit via Video Note ? ?I connected with Margaret Lawson on 10/10/21 at  3:30 PM EDT by a video enabled telemedicine application and verified that I am speaking with the correct person using two identifiers. ? ?Location: ?Patient: Sister's home ?Provider: Clinic ?  ?I discussed the limitations of evaluation and management by telemedicine and the availability of in person appointments. The patient expressed understanding and agreed to proceed. ? ?Follow Up Instructions: ?  ?I discussed the assessment and treatment plan with the patient. The patient was provided an opportunity to ask questions and all were answered. The patient agreed with the plan and demonstrated an understanding of the instructions. ?  ?The patient was advised to call back or seek an in-person evaluation if the symptoms worsen or if the condition fails to improve as anticipated. ? ?I provided 20 minutes of non-face-to-face time during this encounter. ? ?[name], PA  ? ?10/10/2021 7:15 PM ?Margaret Lawson  ?MRN:  [id number] ? ?Chief Complaint:  ?Chief Complaint  ?Patient presents with  ? Follow-up  ? ?HPI:  ? ?Margaret Lawson is a 41 year old female with a past psychiatric history significant for generalized anxiety disorder, PTSD, panic attacks, and bipolar 2 disorder who presents to [facility] via virtual video visit for follow-up and medication management.  Patient is currently being managed on the following medications: ? ?Mirtazapine 30 mg at bedtime ?Buspirone 15 mg 3 times daily ?Lamotrigine 100 mg daily ? ?Patient reports that things have been going alright with her.  Patient reports no major complaints with her current medication regimen.  Patient states that her mood has been relatively balanced and that she has been experiencing more good days than bad.  Patient does endorse having a few rough moments out of the day.  Patient endorses some  frustration, irritability, and fatigue.  Patient states that she feels like she is unable to shut her brain down at times.  Patient's symptoms are alleviated by reading or general isolation. ? ?Patient reports that her anxiety has gotten better.  She reports that it has not been nearly as bad as it has been in the past.  Patient reports that she also noticed that she has not been picking her skin as often, a symptom often accompanied with her anxiety.  Patient states that she is currently stuck at her sister's house quarantining after finding out her sister had Shannon.  Patient's current stressors include financial instability, husband not getting enough work hours, and figuring out what she needs to do in life.  A PHQ-9 screen was performed with the patient scoring a 7.  A GAD-7 screen was also performed with the patient scoring an 11. ? ?Patient is alert and oriented x4, calm, cooperative, and fully engaged in conversation during the encounter.  Patient endorses feeling pleasant although she is frustrated over having to quarantined due to [place].  Patient denies suicidal or homicidal ideations.  She further denies auditory or visual hallucinations and does not appear to be responding to internal/external stimuli.  Patient endorses fair sleep and receives on average 6 hours of sleep each night.  Patient endorses good appetite and eats on average 2 meals per day.  Patient denies alcohol consumption and illicit drug use.  Patient endorses tobacco use and smokes on average a pack per day. ? ?Visit Diagnosis:  ?  ICD-10-CM   ?1. Generalized anxiety disorder  F41.1 mirtazapine (REMERON) 30 MG tablet  ?  busPIRone (BUSPAR) 15 MG tablet  ?  ?2. PTSD (post-traumatic stress disorder)  F43.10 mirtazapine (REMERON) 30 MG tablet  ?  busPIRone (BUSPAR) 15 MG tablet  ?  ?3. Panic attacks  F41.0 mirtazapine (REMERON) 30 MG tablet  ?  busPIRone (BUSPAR) 15 MG tablet  ?  ?4. Bipolar 2 disorder, major depressive episode (HCC)  F31.81  mirtazapine (REMERON) 30 MG tablet  ?  lamoTRIgine (LAMICTAL) 100 MG tablet  ?  ? ? ?Past Psychiatric History:  ?PTSD ?Panic Attacks ?Generalized anxiety disorders ?Bipolar 2 disorder ? ?Past Medical History:  ?Past Medical History:  ?Diagnosis Date  ? Anxiety   ? severe  ? Asthma, chronic 12/27/2013  ? Bipolar 2 disorder ([place])   ? Essential hypertension, benign 12/27/2013  ? Fibromyalgia 12/27/2013  ? GERD (gastroesophageal reflux disease)   ? Headache   ? migraines, over stimulation  ? Leukocytosis 12/27/2013  ? Low iron   ? PCOS (polycystic ovarian syndrome)   ? Perforation of left tympanic membrane 12/27/2013  ? Poor dentition   ? right side upper and lower  ? Restless leg syndrome   ? Right leg pain   ? Teeth clenching   ? Teeth grinding   ? Weakness   ? when exposed to hot or cold temperatures  ?  ?Past Surgical History:  ?Procedure Laterality Date  ? CHOLECYSTECTOMY    ? ROBOTIC ASSISTED TOTAL HYSTERECTOMY WITH BILATERAL SALPINGO OOPHERECTOMY Bilateral 09/10/2015  ? Procedure: ROBOTIC ASSISTED TOTAL HYSTERECTOMY WITH BILATERAL SALPINGECTOMY;  Surgeon: Lavonia Drafts, MD;  Location: [facility];  Service: Gynecology;  Laterality: Bilateral;  ? WISDOM TOOTH EXTRACTION    ? ? ?Family Psychiatric History:  ?Dad - Depression ?Sister - Depression ? ?Family History:  ?Family History  ?Problem Relation Age of Onset  ? Aneurysm Mother   ?     brain  ? Depression Father   ? Heart disease Father   ? Mitral valve prolapse Father   ? Diabetes Father   ? Hypertension Father   ? Depression Sister   ? Breast cancer Paternal Grandfather   ? Breast cancer Paternal Aunt   ? ? ?Social History:  ?Social History  ? ?Socioeconomic History  ? Marital status: Married  ?  Spouse name: Not on file  ? Number of children: Not on file  ? Years of education: Not on file  ? Highest education level: Not on file  ?Occupational History  ? Occupation: unemployed  ?Tobacco Use  ? Smoking status: Every Day  ?  Packs/day: 1.00  ?  Years: 20.00  ?  Pack  years: 20.00  ?  Types: Cigarettes  ? Smokeless tobacco: Never  ? Tobacco comments:  ?  uses '6mg'$  vapes  ?Substance and Sexual Activity  ? Alcohol use: No  ?  Alcohol/week: 0.0 standard drinks  ? Drug use: No  ? Sexual activity: Yes  ?  Partners: Male  ?  Birth control/protection: None  ?Other Topics Concern  ? Not on file  ?Social History Narrative  ? Not on file  ? ?Social Determinants of Health  ? ?Financial Resource Strain: Not on file  ?Food Insecurity: No Food Insecurity  ? Worried About Charity fundraiser in the Last Year: Never true  ? Ran Out of Food in the Last Year: Never true  ?Transportation Needs: Not on file  ?Physical Activity: Inactive  ? Days of Exercise per Week: 0 days  ? Minutes of Exercise per Session: 0 min  ?Stress: No Stress  Concern Present  ? Feeling of Stress : Only a little  ?Social Connections: Moderately Isolated  ? Frequency of Communication with Friends and Family: More than three times a week  ? Frequency of Social Gatherings with Friends and Family: Twice a week  ? Attends Religious Services: Never  ? Active Member of Clubs or Organizations: No  ? Attends Archivist Meetings: Never  ? Marital Status: Married  ? ? ?Allergies:  ?Allergies  ?Allergen Reactions  ? Clarithromycin Nausea And Vomiting  ? Adhesive [Tape] Other (See Comments)  ?  Blisters, paper tape is worse  ? Ambien [Zolpidem Tartrate]   ?  Insomnia.  ? Celecoxib Nausea And Vomiting  ? Doxycycline   ?  Double vision   ? Erythromycin Nausea And Vomiting  ? Lyrica [Pregabalin]   ?  Induced lactation  ? Morphine And Related   ?  Decrease bp  ? Penicillins Hives  ?  Has patient had a PCN reaction causing immediate rash, facial/tongue/throat swelling, SOB or lightheadedness with hypotension: Yes ?Has patient had a PCN reaction causing severe rash involving mucus membranes or skin necrosis: No ?Has patient had a PCN reaction that required hospitalization No ?Has patient had a PCN reaction occurring within the last 10  years: Yes ?If all of the above answers are "NO", then may proceed with Cephalosporin use. ?  ? Latex Rash  ? ? ?Metabolic Disorder Labs: ?Lab Results  ?Component Value Date  ? HGBA1C 5.8 (H) 06/25/2015  ?

## 2021-10-14 ENCOUNTER — Ambulatory Visit (INDEPENDENT_AMBULATORY_CARE_PROVIDER_SITE_OTHER): Payer: BLUE CROSS/BLUE SHIELD | Admitting: Licensed Clinical Social Worker

## 2021-10-14 DIAGNOSIS — F411 Generalized anxiety disorder: Secondary | ICD-10-CM
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
## 2021-10-20 ENCOUNTER — Other Ambulatory Visit: Payer: Self-pay

## 2021-10-20 ENCOUNTER — Encounter: Payer: Self-pay | Admitting: Physician Assistant

## 2021-10-20 ENCOUNTER — Ambulatory Visit (INDEPENDENT_AMBULATORY_CARE_PROVIDER_SITE_OTHER): Payer: BLUE CROSS/BLUE SHIELD | Admitting: Physician Assistant

## 2021-10-20 VITALS — BP 170/90 | HR 82 | Ht 68.0 in | Wt 286.0 lb

## 2021-10-20 DIAGNOSIS — E876 Hypokalemia: Secondary | ICD-10-CM

## 2021-10-20 DIAGNOSIS — M797 Fibromyalgia: Secondary | ICD-10-CM

## 2021-10-20 DIAGNOSIS — M545 Low back pain, unspecified: Secondary | ICD-10-CM | POA: Insufficient documentation

## 2021-10-20 DIAGNOSIS — I10 Essential (primary) hypertension: Secondary | ICD-10-CM | POA: Diagnosis not present

## 2021-10-20 DIAGNOSIS — Z23 Encounter for immunization: Secondary | ICD-10-CM | POA: Diagnosis not present

## 2021-10-20 DIAGNOSIS — R030 Elevated blood-pressure reading, without diagnosis of hypertension: Secondary | ICD-10-CM

## 2021-10-20 DIAGNOSIS — M25552 Pain in left hip: Secondary | ICD-10-CM | POA: Diagnosis not present

## 2021-10-20 MED ORDER — ATENOLOL 50 MG PO TABS: 50.0000 mg | ORAL_TABLET | Freq: Two times a day (BID) | ORAL | 3 refills | Status: DC

## 2021-10-20 MED ORDER — DICLOFENAC SODIUM 75 MG PO TBEC: 75.0000 mg | DELAYED_RELEASE_TABLET | Freq: Two times a day (BID) | ORAL | 0 refills | Status: DC

## 2021-10-20 MED ORDER — METHYLPREDNISOLONE ACETATE 40 MG/ML IJ SUSP: 40.0000 mg | Freq: Once | INTRAMUSCULAR | Status: DC

## 2021-10-20 MED ORDER — LIDOCAINE HCL (PF) 1 % IJ SOLN: 9.0000 mL | Freq: Once | INTRAMUSCULAR | Status: DC

## 2021-10-20 MED ORDER — METHOCARBAMOL 500 MG PO TABS: 500.0000 mg | ORAL_TABLET | Freq: Three times a day (TID) | ORAL | 5 refills | Status: DC

## 2021-10-20 MED ORDER — TRAMADOL HCL 50 MG PO TABS: 50.0000 mg | ORAL_TABLET | Freq: Three times a day (TID) | ORAL | 0 refills | Status: DC | PRN

## 2021-10-20 MED ORDER — TRIAMTERENE-HCTZ 37.5-25 MG PO TABS: 1.0000 | ORAL_TABLET | Freq: Every day | ORAL | 3 refills | Status: DC

## 2021-10-20 MED ORDER — POTASSIUM CHLORIDE CRYS ER 10 MEQ PO TBCR: EXTENDED_RELEASE_TABLET | ORAL | 1 refills | Status: DC

## 2021-10-20 NOTE — Progress Notes (Signed)
? ?Subjective:  ? ? Patient ID: Margaret Lawson, female    DOB: 1981/05/11, 41 y.o.   MRN: [id number] ? ?HPI ?Pt is a 41 yo obese female with HTN, hypokalemia due to medications, fibromyalgia who presents to the clinic with low back pain radiating into left hip. Pain started about 2 weeks ago in her left low back. She stretched it out and seems to go away for a day or so. Symptoms then came pain in left low back and over left greater trochanter.No injury. Reports pain 4-8 out of 10 on pain scale depending on moving or not. Worse with standing and walking. Better with sitting. No radiation of pain down leg. No numbness or tingling down leg.  ? ?She does need medication refills: potassium to take with her lasix, robaxin, atenolol, and triamterene. She is not checking her BP at home. Denies any CP, palpitations, headaches or vision changes.  ? ? ?.. ?Active Ambulatory Problems  ?  Diagnosis Date Noted  ? History of nephrolithiasis 12/27/2013  ? Essential hypertension, benign 12/27/2013  ? Fibromyalgia 12/27/2013  ? Muscle spasm 12/27/2013  ? GERD (gastroesophageal reflux disease) 12/27/2013  ? Leukocytosis 12/27/2013  ? Hyperlipidemia 01/03/2014  ? Other migraine without status migrainosus, not intractable 03/07/2015  ? 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  ? History of cholecystectomy 09/11/2010  ? GAD (generalized anxiety disorder) 09/10/2015  ? Hypokalemia 09/27/2015  ? Benign abdominal serous tumor 12/10/2015  ? Surgical menopause 04/23/2016  ? Cervical spondylosis without myelopathy 04/23/2016  ? Nipple discharge in female 08/03/2016  ? Anorectal skin tags 08/03/2016  ? Bipolar 2 disorder, major depressive episode ([place]) 11/13/2016  ? Frequent headaches 11/15/2016  ? Family history of brain aneurysm 11/15/2016  ? Moderate persistent asthma without complication 12/45/8099  ? Functional diarrhea 12/03/2016  ? Fatigue 02/13/2018  ? Inattention 02/13/2018  ? Hot flashes 02/26/2018  ? Panic attacks  03/30/2018  ? Insomnia 03/30/2018  ? Grief reaction 08/28/2019  ? Weight gain 08/28/2019  ? Hot flashes due to surgical menopause 08/28/2019  ? Female climacteric state 08/28/2019  ? PTSD (post-traumatic stress disorder) 01/10/2020  ? Bipolar disorder ([place]) 01/10/2020  ? Borderline personality disorder ([place]) 01/10/2020  ? Tachycardia 02/17/2021  ? Nausea 02/17/2021  ? Chronic fatigue 02/17/2021  ? Excessive sweating 02/17/2021  ? Flushing 02/17/2021  ? Acute left-sided low back pain without sciatica 10/20/2021  ? ?Resolved Ambulatory Problems  ?  Diagnosis Date Noted  ? Asthma, chronic 12/27/2013  ? Perforation of left tympanic membrane 12/27/2013  ? Bipolar I disorder, most recent episode depressed ([place]) 10/11/2014  ? Ovarian cyst, right 07/15/2015  ? Amenorrhea 07/15/2015  ? Abnormal uterine bleeding (AUB) 09/10/2015  ? Post-operative state 09/10/2015  ? Acute tonsillitis 04/23/2016  ? Loose stools 08/03/2016  ? No energy 11/13/2016  ? Acute non-recurrent maxillary sinusitis 12/03/2016  ? ?Past Medical History:  ?Diagnosis Date  ? Anxiety   ? Bipolar 2 disorder ([place])   ? Headache   ? Low iron   ? PCOS (polycystic ovarian syndrome)   ? Poor dentition   ? Restless leg syndrome   ? Right leg pain   ? Teeth clenching   ? Teeth grinding   ? Weakness   ? ? ? ?Review of Systems ?See HPI.  ?   ?Objective:  ? Physical Exam ?Vitals reviewed.  ?Constitutional:   ?   Appearance: Normal appearance. She is obese.  ?HENT:  ?   Head: Normocephalic.  ?Cardiovascular:  ?  Rate and Rhythm: Normal rate and regular rhythm.  ?   Pulses: Normal pulses.  ?   Heart sounds: Normal heart sounds.  ?Pulmonary:  ?   Effort: Pulmonary effort is normal.  ?   Breath sounds: Normal breath sounds.  ?Musculoskeletal:  ?   Right lower leg: No edema.  ?   Left lower leg: No edema.  ?   Comments: NROM at waist ?Pain with internal rotation of left hip ?Pain over left greater trochanter ?Tightness and tenderness of left paraspinal muscles ?Strength  lower ext bilateral 5/5.  ?2+symmetric patellar tendon reflexes.   ?Neurological:  ?   General: No focal deficit present.  ?   Mental Status: She is alert and oriented to person, place, and time.  ?Psychiatric:     ?   Mood and Affect: Mood normal.  ? ? ?Aspiration/Injection Procedure Note ?Margaret Lawson ?[id number] ?[birth year] ? ?Procedure: Injection ?Indications: pain ? ?Procedure Details ?Consent: Risks of procedure as well as the alternatives and risks of each were explained to the (patient/caregiver).  Consent for procedure obtained. ?Time Out: Verified patient identification, verified procedure, site/side was marked, verified correct patient position, special equipment/implants available, medications/allergies/relevent history reviewed, required imaging and test results available.  Performed  ? ?Local Anesthesia Used:Ethyl Chloride Spray ?INjection of depo medrol '40mg'$  72m and lidocaine 1 percent without epi 960m?A sterile dressing was applied. ? ?Patient did tolerate procedure well. ? ? ?[name] ? ? ? ? ?   ?Assessment & Plan:  ?..KiYazhinias seen today for hip pain. ? ?Diagnoses and all orders for this visit: ? ?Left hip pain ?-     methylPREDNISolone acetate (DEPO-MEDROL) injection 40 mg ?-     lidocaine (PF) (XYLOCAINE) 1 % injection 9 mL ?-     diclofenac (VOLTAREN) 75 MG EC tablet; Take 1 tablet (75 mg total) by mouth 2 (two) times daily. ?-     traMADol (ULTRAM) 50 MG tablet; Take 1 tablet (50 mg total) by mouth every 8 (eight) hours as needed for moderate pain. ? ?Elevated blood pressure reading ? ?Essential hypertension, benign ?-     atenolol (TENORMIN) 50 MG tablet; Take 1 tablet (50 mg total) by mouth 2 (two) times daily. ?-     triamterene-hydrochlorothiazide (MAXZIDE-25) 37.5-25 MG tablet; Take 1 tablet by mouth daily. ? ?Fibromyalgia ?-     methocarbamol (ROBAXIN) 500 MG tablet; Take 1 tablet (500 mg total) by mouth 3 (three) times daily. ? ?Acute left-sided low back pain without  sciatica ?-     diclofenac (VOLTAREN) 75 MG EC tablet; Take 1 tablet (75 mg total) by mouth 2 (two) times daily. ?-     traMADol (ULTRAM) 50 MG tablet; Take 1 tablet (50 mg total) by mouth every 8 (eight) hours as needed for moderate pain. ? ?Hypokalemia ?-     potassium chloride (KLOR-CON M) 10 MEQ tablet; To take one tablet with lasix as needed. ? ?Need for influenza vaccination ?-     Flu Vaccine QUAD 6+ mos PF IM (Fluarix Quad PF) ? ?Need for tetanus booster ?-     Tdap vaccine greater than or equal to 7yo IM ? ? ?Medications refilled ?BP not to goal but patient is in pain ?Recheck in 2 weeks to make sure adjustments do not need to be made ?Encouraged to check BP at home.  ?I believe some low back sprain with bursitis of left hip ?Injection done today ?Start diclofenac ?Continue robaxin as needed ?Given exercises ?  Ice  ?Tens unit encouraged ?Follow up in 2 weeks ? ?

## 2021-10-20 NOTE — Patient Instructions (Signed)

## 2021-10-23 ENCOUNTER — Encounter: Payer: Self-pay | Admitting: Physician Assistant

## 2021-10-30 ENCOUNTER — Encounter: Payer: Self-pay | Admitting: Physician Assistant

## 2021-11-03 ENCOUNTER — Other Ambulatory Visit: Payer: Self-pay | Admitting: Physician Assistant

## 2021-11-03 ENCOUNTER — Ambulatory Visit: Payer: BLUE CROSS/BLUE SHIELD

## 2021-11-03 DIAGNOSIS — Z5181 Encounter for therapeutic drug level monitoring: Secondary | ICD-10-CM

## 2021-11-06 ENCOUNTER — Ambulatory Visit (HOSPITAL_COMMUNITY): Payer: Self-pay | Admitting: Licensed Clinical Social Worker

## 2021-11-18 ENCOUNTER — Ambulatory Visit (INDEPENDENT_AMBULATORY_CARE_PROVIDER_SITE_OTHER): Payer: BLUE CROSS/BLUE SHIELD | Admitting: Physician Assistant

## 2021-11-18 VITALS — BP 118/64 | HR 60 | Temp 98.2°F | Ht 68.0 in | Wt 281.0 lb

## 2021-11-18 DIAGNOSIS — H6592 Unspecified nonsuppurative otitis media, left ear: Secondary | ICD-10-CM | POA: Diagnosis not present

## 2021-11-18 DIAGNOSIS — H9192 Unspecified hearing loss, left ear: Secondary | ICD-10-CM

## 2021-11-18 DIAGNOSIS — F5101 Primary insomnia: Secondary | ICD-10-CM | POA: Diagnosis not present

## 2021-11-18 MED ORDER — CEFDINIR 300 MG PO CAPS: 300.0000 mg | ORAL_CAPSULE | Freq: Two times a day (BID) | ORAL | 0 refills | Status: DC

## 2021-11-18 MED ORDER — AMITRIPTYLINE HCL 25 MG PO TABS: 25.0000 mg | ORAL_TABLET | Freq: Every day | ORAL | 0 refills | Status: DC

## 2021-11-18 MED ORDER — FLUCONAZOLE 150 MG PO TABS: ORAL_TABLET | ORAL | 0 refills | Status: DC

## 2021-11-18 MED ORDER — METHYLPREDNISOLONE 4 MG PO TBPK: ORAL_TABLET | ORAL | 0 refills | Status: DC

## 2021-11-18 NOTE — Progress Notes (Signed)
? ?Acute Office Visit ? ?Subjective:  ? ?  ?Patient ID: Margaret Lawson, female    DOB: 11-May-1981, 41 y.o.   MRN: [id number] ? ?Chief Complaint  ?Patient presents with  ? Ear Pain  ?  Left ?  ? ? ?HPI ?Patient is in today for left ear pain for the last week. Hx of ear infections. Having some hearing trouble and loss in left ear. No fever, chills, sinus pressure, ST, headache. Using flonase and antihistamines.  ? ?Trouble getting and staying asleep. Remembers trying elavil and helped a lot would like to try again.  ?\ ? ?.. ?Active Ambulatory Problems  ?  Diagnosis Date Noted  ? History of nephrolithiasis 12/27/2013  ? Essential hypertension, benign 12/27/2013  ? Fibromyalgia 12/27/2013  ? Muscle spasm 12/27/2013  ? GERD (gastroesophageal reflux disease) 12/27/2013  ? Leukocytosis 12/27/2013  ? Hyperlipidemia 01/03/2014  ? Other migraine without status migrainosus, not intractable 03/07/2015  ? 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  ? History of cholecystectomy 09/11/2010  ? GAD (generalized anxiety disorder) 09/10/2015  ? Hypokalemia 09/27/2015  ? Benign abdominal serous tumor 12/10/2015  ? Surgical menopause 04/23/2016  ? Cervical spondylosis without myelopathy 04/23/2016  ? Nipple discharge in female 08/03/2016  ? Anorectal skin tags 08/03/2016  ? Bipolar 2 disorder, major depressive episode ([place]) 11/13/2016  ? Frequent headaches 11/15/2016  ? Family history of brain aneurysm 11/15/2016  ? Moderate persistent asthma without complication 00/34/9179  ? Functional diarrhea 12/03/2016  ? Fatigue 02/13/2018  ? Inattention 02/13/2018  ? Hot flashes 02/26/2018  ? Panic attacks 03/30/2018  ? Insomnia 03/30/2018  ? Grief reaction 08/28/2019  ? Weight gain 08/28/2019  ? Hot flashes due to surgical menopause 08/28/2019  ? Female climacteric state 08/28/2019  ? PTSD (post-traumatic stress disorder) 01/10/2020  ? Bipolar disorder ([place]) 01/10/2020  ? Borderline personality disorder ([place]) 01/10/2020  ?  Tachycardia 02/17/2021  ? Nausea 02/17/2021  ? Chronic fatigue 02/17/2021  ? Excessive sweating 02/17/2021  ? Flushing 02/17/2021  ? Acute left-sided low back pain without sciatica 10/20/2021  ? Tobacco use disorder 07/02/2018  ? Severe episode of recurrent major depressive disorder, without psychotic features ([place]) 07/01/2018  ? Cannabis abuse 06/29/2018  ? Other stimulant dependence, in remission ([place]) 07/08/2018  ? Nicotine dependence, unspecified, uncomplicated 15/11/6977  ? 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 Cannabis dependence, uncomplicated ([place]) 48/07/6551  ? Alcohol dependence, uncomplicated ([place]) 74/82/7078  ? Left otitis media with effusion 11/19/2021  ? Hearing loss of left ear 11/19/2021  ? ?Resolved Ambulatory Problems  ?  Diagnosis Date Noted  ? Asthma, chronic 12/27/2013  ? Perforation of left tympanic membrane 12/27/2013  ? Bipolar I disorder, most recent episode depressed ([place]) 10/11/2014  ? Ovarian cyst, right 07/15/2015  ? Amenorrhea 07/15/2015  ? Abnormal uterine bleeding (AUB) 09/10/2015  ? Post-operative state 09/10/2015  ? Acute tonsillitis 04/23/2016  ? Loose stools 08/03/2016  ? No energy 11/13/2016  ? Acute non-recurrent maxillary sinusitis 12/03/2016  ? ?Past Medical History:  ?Diagnosis Date  ? Anxiety   ? Bipolar 2 disorder ([place])   ? Headache   ? Low iron   ? PCOS (polycystic ovarian syndrome)   ? Poor dentition   ? Restless leg syndrome   ? Right leg pain   ? Teeth clenching   ? Teeth grinding   ? Weakness   ? ? ? ?ROS ?See HPI.  ? ?   ?Objective:  ?  ?BP 118/64   Pulse 60  Temp 98.2 ?F (36.8 ?C) (Oral)   Ht '5\' 8"'$  (1.727 m)   Wt 281 lb (127.5 kg)   LMP 07/28/2015   SpO2 100%   BMI 42.73 kg/m?  ?BP Readings from Last 3 Encounters:  ?11/18/21 118/64  ?10/20/21 (!) 170/90  ?02/07/21 119/78  ? ?  ? ?Physical Exam ?Vitals reviewed.  ?Constitutional:   ?   Appearance: Normal appearance. She is obese.  ?HENT:  ?   Head: Normocephalic.  ?   Right Ear: Tympanic membrane, ear  canal and external ear normal. There is no impacted cerumen.  ?   Left Ear: Ear canal and external ear normal. There is no impacted cerumen.  ?   Ears:  ?   Comments: TM completely white with sclerosis appearance.  ?   Nose: Nose normal.  ?   Mouth/Throat:  ?   Mouth: Mucous membranes are moist.  ?Cardiovascular:  ?   Rate and Rhythm: Normal rate and regular rhythm.  ?   Pulses: Normal pulses.  ?Pulmonary:  ?   Effort: Pulmonary effort is normal.  ?   Breath sounds: Normal breath sounds.  ?Neurological:  ?   General: No focal deficit present.  ?   Mental Status: She is alert.  ? ? ? ?Hearing Screening  ? '1000Hz'$  '2000Hz'$  '4000Hz'$   ?Right ear '20 25 20  '$ ?Left ear '20 25 20  '$ ? ? ?   ?Assessment & Plan:  ?..[name] was seen today for ear pain. ? ?Diagnoses and all orders for this visit: ? ?Left otitis media with effusion ?-     cefdinir (OMNICEF) 300 MG capsule; Take 1 capsule (300 mg total) by mouth 2 (two) times daily. For 10 days. ?-     methylPREDNISolone (MEDROL DOSEPAK) 4 MG TBPK tablet; Take as directed by package insert. ?-     fluconazole (DIFLUCAN) 150 MG tablet; Once now and then when finish antibiotic. ? ?Primary insomnia ?-     amitriptyline (ELAVIL) 25 MG tablet; Take 1 tablet (25 mg total) by mouth at bedtime. ? ?Hearing loss of left ear, unspecified hearing loss type ? ? ?No significant hearing changes ?Treated with abx and prednisone ?Continue flonase and anti-histamines ?Added elavil for sleep.  ?Follow up in 1 week.  ? ? ?Iran Planas, PA-C ? ? ?

## 2021-11-18 NOTE — Patient Instructions (Signed)
Otitis Media With Effusion, Adult  Otitis media with effusion (OME) is inflammation and fluid (effusion) in the middle ear without having an ear infection. The middle ear is the space behind the eardrum. The middle ear is connected to the back of the throat by a narrow tube (eustachian tube). Normally the eustachian tube drains fluid out of the middle ear. A swollen eustachian tube can become blocked and cause fluid to collect in the middle ear. OME often goes away without treatment. Sometimes OME can lead to hearing problems and recurrent acute ear infections (acute otitis media). These conditions may require treatment. What are the causes? OME is caused by a blocked eustachian tube. This can result from: Allergies. Upper respiratory infections. Enlarged adenoids. The adenoids are areas of soft tissue located high in the back of the throat, behind the nose and the roof of the mouth. They are part of the body's natural defense system (immune system). Rapid changes in pressure, like when an airplane is descending or during scuba diving. In some cases, the cause of this condition is not known. What are the signs or symptoms? Common symptoms of this condition include: A feeling of fullness in your ear. Decreased hearing in the affected ear. Fluid draining into the ear canal. Pain in the ear. In some cases, there are no symptoms. How is this diagnosed?  A health care provider can diagnose OME based on signs and symptoms of the condition. Your provider will also do a physical exam to check for fluid behind the eardrum. During the exam, your health care provider will use an instrument called an otoscope to look in your ear. Your health care provider may do other tests, such as: A hearing test. A tympanogram. This is a test that shows how well the eardrum moves in response to air pressure in the ear canal. It provides a graph for your health care provider to review. A pneumatic otoscopy. This is a  test to check how your eardrum moves in response to changes in pressure. It is done by squeezing a small amount of air into the ear. How is this treated? Treatment for OME depends on the cause of the condition and the severity of symptoms. The first step is often waiting to see if the fluid drains on its own in a few weeks. Home care treatment may include: Over-the-counter pain relievers. A warm, moist cloth placed over the ear. Severe cases may require a procedure to insert tubes in the ears (tympanostomy tubes) to drain the fluid. Follow these instructions at home: Take over-the-counter and prescription medicines only as told by your health care provider. Keep all follow-up visits. Contact a health care provider if: You have pain that gets worse. Hearing in your affected ear gets worse. You have fluid draining from your ear canal. You have dizziness. You develop a fever. Get help right away if: You develop a severe headache. You completely lose hearing in the affected ear. You have bleeding from your ear canal. You have sudden and severe pain in your ear. These symptoms may represent a serious problem that is an emergency. Do not wait to see if the symptoms will go away. Get medical help right away. Call your local emergency services (911 in the [place] Do not drive yourself to the hospital. Summary Otitis media with effusion (OME) is inflammation and fluid (effusion) in the middle ear without having an ear infection. A swollen eustachian tube can become blocked and cause fluid to collect in the   middle ear. Treatment for OME depends on the cause of the condition and the severity of symptoms. Many times, treatment is not needed because the fluid drains on its own in a few weeks. Sometimes OME can lead to hearing problems and recurrent acute ear infections (acute otitis media), which may require treatment. This information is not intended to replace advice given to you by your health care  provider. Make sure you discuss any questions you have with your health care provider. Document Revised: 11/07/2020 Document Reviewed: 11/07/2020 Elsevier Patient Education  2023 Elsevier Inc.  

## 2021-11-19 ENCOUNTER — Encounter: Payer: Self-pay | Admitting: Physician Assistant

## 2021-11-19 DIAGNOSIS — H9192 Unspecified hearing loss, left ear: Secondary | ICD-10-CM | POA: Insufficient documentation

## 2021-11-19 DIAGNOSIS — H6592 Unspecified nonsuppurative otitis media, left ear: Secondary | ICD-10-CM | POA: Insufficient documentation

## 2021-12-03 ENCOUNTER — Ambulatory Visit (INDEPENDENT_AMBULATORY_CARE_PROVIDER_SITE_OTHER): Payer: Self-pay | Admitting: Physician Assistant

## 2021-12-03 ENCOUNTER — Encounter: Payer: Self-pay | Admitting: Physician Assistant

## 2021-12-03 VITALS — BP 116/55 | HR 60 | Temp 97.9°F | Ht 68.0 in | Wt 281.0 lb

## 2021-12-03 DIAGNOSIS — H9192 Unspecified hearing loss, left ear: Secondary | ICD-10-CM

## 2021-12-03 DIAGNOSIS — B009 Herpesviral infection, unspecified: Secondary | ICD-10-CM

## 2021-12-03 DIAGNOSIS — Z01118 Encounter for examination of ears and hearing with other abnormal findings: Secondary | ICD-10-CM | POA: Insufficient documentation

## 2021-12-03 DIAGNOSIS — H9202 Otalgia, left ear: Secondary | ICD-10-CM

## 2021-12-03 MED ORDER — VALACYCLOVIR HCL 500 MG PO TABS: 500.0000 mg | ORAL_TABLET | Freq: Every day | ORAL | 3 refills | Status: DC

## 2021-12-03 NOTE — Progress Notes (Signed)
? ?Acute Office Visit ? ?Subjective:  ? ?  ?Patient ID: Margaret Lawson, female    DOB: 01-06-1981, 41 y.o.   MRN: [id number] ? ?Chief Complaint  ?Patient presents with  ? Ear Problem  ?   ?  ? ? ?HPI ?Patient is in today for left ear follow up. She has hx of ear infections and had tubes placed as a child. She completed 7 days of the abx and did not make any difference in the ear. She has hearing loss in left ear with achy and popping sensation. No fever, chills, dizziness, nausea.  ? ?Pt needs valtrex refilled. She has a cold sore that has come up since stopping. She wants to go back on suppression therapy.  ? ?.. ?Active Ambulatory Problems  ?  Diagnosis Date Noted  ? History of nephrolithiasis 12/27/2013  ? Essential hypertension, benign 12/27/2013  ? Fibromyalgia 12/27/2013  ? Muscle spasm 12/27/2013  ? GERD (gastroesophageal reflux disease) 12/27/2013  ? Leukocytosis 12/27/2013  ? Hyperlipidemia 01/03/2014  ? Other migraine without status migrainosus, not intractable 03/07/2015  ? 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  ? History of cholecystectomy 09/11/2010  ? GAD (generalized anxiety disorder) 09/10/2015  ? Hypokalemia 09/27/2015  ? Benign abdominal serous tumor 12/10/2015  ? Surgical menopause 04/23/2016  ? Cervical spondylosis without myelopathy 04/23/2016  ? Nipple discharge in female 08/03/2016  ? Anorectal skin tags 08/03/2016  ? Bipolar 2 disorder, major depressive episode ([place]) 11/13/2016  ? Frequent headaches 11/15/2016  ? Family history of brain aneurysm 11/15/2016  ? Moderate persistent asthma without complication 75/64/3329  ? Functional diarrhea 12/03/2016  ? Fatigue 02/13/2018  ? Inattention 02/13/2018  ? Hot flashes 02/26/2018  ? Panic attacks 03/30/2018  ? Insomnia 03/30/2018  ? Grief reaction 08/28/2019  ? Weight gain 08/28/2019  ? Hot flashes due to surgical menopause 08/28/2019  ? Female climacteric state 08/28/2019  ? PTSD (post-traumatic stress disorder) 01/10/2020  ?  Bipolar disorder ([place]) 01/10/2020  ? Borderline personality disorder ([place]) 01/10/2020  ? Tachycardia 02/17/2021  ? Nausea 02/17/2021  ? Chronic fatigue 02/17/2021  ? Excessive sweating 02/17/2021  ? Flushing 02/17/2021  ? Acute left-sided low back pain without sciatica 10/20/2021  ? Tobacco use disorder 07/02/2018  ? Severe episode of recurrent major depressive disorder, without psychotic features ([place]) 07/01/2018  ? Cannabis abuse 06/29/2018  ? Other stimulant dependence, in remission ([place]) 07/08/2018  ? Nicotine dependence, unspecified, uncomplicated 51/88/4166  ? Cocaine abuse in remission ([place]) 07/08/2018  ? Cannabis dependence, uncomplicated ([place]) 01/24/1600  ? Alcohol dependence, uncomplicated ([place]) 09/32/3557  ? Left otitis media with effusion 11/19/2021  ? Hearing loss of left ear 11/19/2021  ? Abnormal exam of left ear 12/03/2021  ? Left ear pain 12/05/2021  ? ?Resolved Ambulatory Problems  ?  Diagnosis Date Noted  ? Asthma, chronic 12/27/2013  ? Perforation of left tympanic membrane 12/27/2013  ? Bipolar I disorder, most recent episode depressed ([place]) 10/11/2014  ? Ovarian cyst, right 07/15/2015  ? Amenorrhea 07/15/2015  ? Abnormal uterine bleeding (AUB) 09/10/2015  ? Post-operative state 09/10/2015  ? Acute tonsillitis 04/23/2016  ? Loose stools 08/03/2016  ? No energy 11/13/2016  ? Acute non-recurrent maxillary sinusitis 12/03/2016  ? ?Past Medical History:  ?Diagnosis Date  ? Anxiety   ? Bipolar 2 disorder ([place])   ? Headache   ? Low iron   ? PCOS (polycystic ovarian syndrome)   ? Poor dentition   ? Restless leg syndrome   ? Right leg  pain   ? Teeth clenching   ? Teeth grinding   ? Weakness   ? ? ? ?ROS ? ?See HPI.  ?   ?Objective:  ?  ?BP (!) 116/55   Pulse 60   Temp 97.9 ?F (36.6 ?C) (Oral)   Ht '5\' 8"'$  (1.727 m)   Wt 281 lb (127.5 kg)   LMP 07/28/2015   SpO2 98%   BMI 42.73 kg/m?  ?BP Readings from Last 3 Encounters:  ?12/03/21 (!) 116/55  ?11/18/21 118/64  ?10/20/21 (!) 170/90  ? ?  .. ? ?Physical Exam ?Vitals reviewed.  ?Constitutional:   ?   Appearance: Normal appearance. She is obese.  ?HENT:  ?   Head: Normocephalic.  ?   Right Ear: Ear canal and external ear normal. There is no impacted cerumen.  ?   Left Ear: Ear canal and external ear normal. There is no impacted cerumen.  ?   Ears:  ?   Comments:  ? ?Left TM- white pearly appearance to all of TM. Able to view ossicle.  ? ?[place]- lateralized to left ear ?[place]- Left Bone greater than Air/Right Air greater than Bone ? ?Neurological:  ?   Mental Status: She is alert.  ? ? ? ? ?   ?Assessment & Plan:  ?..Javayah was seen today for ear problem. ? ?Diagnoses and all orders for this visit: ? ?Abnormal exam of left ear ?-     Ambulatory referral to ENT ? ?HSV infection ?-     valACYclovir (VALTREX) 500 MG tablet; Take 1 tablet (500 mg total) by mouth daily. ? ?Hearing loss of left ear, unspecified hearing loss type ?-     Ambulatory referral to ENT ? ?Left ear pain ?-     Ambulatory referral to ENT ? ? ?Will refer to ENT.  ?Appearance of TM not changed. ?Hearing loss not changed. 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  ? ? ? ?Return if symptoms worsen or fail to improve. ? ?Iran Planas, PA-C ? ? ?

## 2021-12-05 ENCOUNTER — Ambulatory Visit (HOSPITAL_COMMUNITY): Payer: BLUE CROSS/BLUE SHIELD | Admitting: Licensed Clinical Social Worker

## 2021-12-05 DIAGNOSIS — H9202 Otalgia, left ear: Secondary | ICD-10-CM | POA: Insufficient documentation

## 2021-12-12 ENCOUNTER — Telehealth (HOSPITAL_COMMUNITY): Payer: No Payment, Other | Admitting: Physician Assistant

## 2021-12-29 ENCOUNTER — Ambulatory Visit: Payer: Self-pay | Admitting: Physician Assistant

## 2022-01-20 ENCOUNTER — Encounter (HOSPITAL_COMMUNITY): Payer: Self-pay | Admitting: Family

## 2022-01-20 ENCOUNTER — Telehealth (INDEPENDENT_AMBULATORY_CARE_PROVIDER_SITE_OTHER): Payer: No Typology Code available for payment source | Admitting: Family

## 2022-01-20 DIAGNOSIS — F4310 Post-traumatic stress disorder, unspecified: Secondary | ICD-10-CM | POA: Diagnosis not present

## 2022-01-20 DIAGNOSIS — F410 Panic disorder [episodic paroxysmal anxiety] without agoraphobia: Secondary | ICD-10-CM | POA: Diagnosis not present

## 2022-01-20 DIAGNOSIS — F411 Generalized anxiety disorder: Secondary | ICD-10-CM | POA: Diagnosis not present

## 2022-01-20 DIAGNOSIS — F3181 Bipolar II disorder: Secondary | ICD-10-CM

## 2022-01-20 DIAGNOSIS — F330 Major depressive disorder, recurrent, mild: Secondary | ICD-10-CM

## 2022-01-20 MED ORDER — LAMOTRIGINE 100 MG PO TABS: 100.0000 mg | ORAL_TABLET | Freq: Every day | ORAL | 2 refills | Status: DC

## 2022-01-20 MED ORDER — MIRTAZAPINE 30 MG PO TABS: 30.0000 mg | ORAL_TABLET | Freq: Every day | ORAL | 2 refills | Status: DC

## 2022-01-20 MED ORDER — BUSPIRONE HCL 15 MG PO TABS: 15.0000 mg | ORAL_TABLET | Freq: Three times a day (TID) | ORAL | 2 refills | Status: DC

## 2022-01-20 NOTE — Progress Notes (Signed)
Virtual Visit via Telephone Note  I connected with Margaret Lawson on 01/20/22 at  5:00 PM EDT by telephone and verified that I am speaking with the correct person using two identifiers.  Location: Patient: Home Provider: Office   I discussed the limitations, risks, security and privacy concerns of performing an evaluation and management service by telephone and the availability of in person appointments. I also discussed with the patient that there may be a patient responsible charge related to this service. The patient expressed understanding and agreed to proceed.    I discussed the assessment and treatment plan with the patient. The patient was provided an opportunity to ask questions and all were answered. The patient agreed with the plan and demonstrated an understanding of the instructions.   The patient was advised to call back or seek an in-person evaluation if the symptoms worsen or if the condition fails to improve as anticipated.  I provided 15 minutes of non-face-to-face time during this encounter.   Derrill Center, NP   [place] MD/PA/NP OP Progress Note  01/20/2022 4:41 PM Margaret Lawson  MRN:  [id number]  Chief Complaint:  Margaret Lawson reported " I have been irritable and moody over the past few weeks."  Margaret Lawson is a 41 year old female who presents for medication management follow-up.  Has a charted history of bipolar 2 disorder, generalized anxiety disorder, posttraumatic stress disorder and major depressive disorder.  Currently she is prescribed Remeron, Lamictal and BuSpar.  She reports she has been taking medications as directed.  States her primary care provider recently initiated Elavil 25 mg for insomnia.  She reports since taking this medication she has noticed increased irritability and worsening anxiety.  However attributes her symptoms due to her current unemployment status and states increased financial concerns.  States " I owe back taxes and I do not  know how I am going to pay."  Rating her depression and anxiety 8 out of 10 with 10 being the worst.    Reports she has been picking at the cuticles of her's nailbed. "  This is I know my anxiety/stress is worse." denied illicit drug use.  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  Margaret Lawson is denying suicidal or homicidal ideations.  Denies auditory visual hallucinations.  Discussed continue to monitor symptoms.  Discussed if symptoms worsen discontinue Elavil and follow-up for medication adjustment.  She was receptive to plan.  She reports a good appetite.  Reports concerns with sleeping well at night.  States she wants to continue current medication regimen.  Support,encouragement  and reassurance was provided.   HPI:  Visit Diagnosis:    ICD-10-CM   1. MDD (major depressive disorder), recurrent episode, mild ([place])  F33.0     2. Generalized anxiety disorder  F41.1       Past Psychiatric History:  Past Medical History:  Past Medical History:  Diagnosis Date   Anxiety    severe   Asthma, chronic 12/27/2013   Bipolar 2 disorder ([place])    Essential hypertension, benign 12/27/2013   Fibromyalgia 12/27/2013   GERD (gastroesophageal reflux disease)    Headache    migraines, over stimulation   Leukocytosis 12/27/2013   Low iron    PCOS (polycystic ovarian syndrome)    Perforation of left tympanic membrane 12/27/2013   Poor dentition    right side upper and lower  Restless leg syndrome    Right leg pain    Teeth clenching    Teeth grinding    Weakness    when exposed to hot or cold temperatures    Past Surgical History:  Procedure Laterality Date   CHOLECYSTECTOMY     ROBOTIC ASSISTED TOTAL HYSTERECTOMY WITH BILATERAL SALPINGO OOPHERECTOMY Bilateral 09/10/2015   Procedure: ROBOTIC ASSISTED TOTAL HYSTERECTOMY WITH BILATERAL SALPINGECTOMY;   Surgeon: Lavonia Drafts, MD;  Location: [facility];  Service: Gynecology;  Laterality: Bilateral;   WISDOM TOOTH EXTRACTION      Family Psychiatric History:   Family History:  Family History  Problem Relation Age of Onset   Aneurysm Mother        brain   Depression Father    Heart disease Father    Mitral valve prolapse Father    Diabetes Father    Hypertension Father    Depression Sister    Breast cancer Paternal Grandfather    Breast cancer Paternal Aunt     Social History:  Social History   Socioeconomic History   Marital status: Married    Spouse name: Not on file   Number of children: Not on file   Years of education: Not on file   Highest education level: Not on file  Occupational History   Occupation: unemployed  Tobacco Use   Smoking status: Every Day    Packs/day: 1.00    Years: 20.00    Total pack years: 20.00    Types: Cigarettes   Smokeless tobacco: Never   Tobacco comments:    uses '6mg'$  vapes  Substance and Sexual Activity   Alcohol use: No    Alcohol/week: 0.0 standard drinks of alcohol   Drug use: No   Sexual activity: Yes    Partners: Male    Birth control/protection: None  Other Topics Concern   Not on file  Social History Narrative   Not on file   Social Determinants of Health   Financial Resource Strain: Low Risk  (01/18/2020)   Overall Financial Resource Strain (CARDIA)    Difficulty of Paying Living Expenses: Not hard at all  Food Insecurity: No Food Insecurity (01/09/2021)   Hunger Vital Sign    Worried About Running Out of Food in the Last Year: Never true    Ran Out of Food in the Last Year: Never true  Transportation Needs: No Transportation Needs (01/18/2020)   PRAPARE - Hydrologist (Medical): No    Lack of Transportation (Non-Medical): No  Physical Activity: Inactive (01/09/2021)   Exercise Vital Sign    Days of Exercise per Week: 0 days    Minutes of Exercise per Session: 0 min  Stress: No  Stress Concern Present (01/09/2021)   [place]    Feeling of Stress : Only a little  Social Connections: Moderately Isolated (01/09/2021)   Social Connection and Isolation Panel [NHANES]    Frequency of Communication with Friends and Family: More than three times a week    Frequency of Social Gatherings with Friends and Family: Twice a week    Attends Religious Services: Never    Marine scientist or Organizations: No    Attends Archivist Meetings: Never    Marital Status: Married    Allergies:  Allergies  Allergen Reactions   Clarithromycin Nausea And Vomiting   Adhesive [Tape] Other (See Comments)    Blisters, paper tape is worse  Ambien [Zolpidem Tartrate]     Insomnia.   Celecoxib Nausea And Vomiting   Doxycycline     Double vision    Erythromycin Nausea And Vomiting   Lyrica [Pregabalin]     Induced lactation   Morphine And Related     Decrease bp   Penicillins Hives    Has patient had a PCN reaction causing immediate rash, facial/tongue/throat swelling, SOB or lightheadedness with hypotension: Yes Has patient had a PCN reaction causing severe rash involving mucus membranes or skin necrosis: No Has patient had a PCN reaction that required hospitalization No Has patient had a PCN reaction occurring within the last 10 years: Yes If all of the above answers are "NO", then may proceed with Cephalosporin use.    Latex Rash    Metabolic Disorder Labs: Lab Results  Component Value Date   HGBA1C 5.8 (H) 06/25/2015   MPG 120 (H) 06/25/2015   No results found for: "PROLACTIN" Lab Results  Component Value Date   CHOL 213 (H) 02/27/2015   TRIG 210 (H) 02/27/2015   HDL 34 (L) 02/27/2015   CHOLHDL 6.3 (H) 02/27/2015   VLDL 42 (H) 02/27/2015   LDLCALC 137 (H) 02/27/2015   LDLCALC 116 (H) 01/02/2014   Lab Results  Component Value Date   TSH 1.29 11/13/2016   TSH 1.748 02/27/2015     Therapeutic Level Labs: No results found for: "LITHIUM" No results found for: "VALPROATE" No results found for: "CBMZ"  Current Medications: Current Outpatient Medications  Medication Sig Dispense Refill   acetaminophen (TYLENOL) 500 MG tablet Take 500 mg by mouth every 6 (six) hours as needed for mild pain or moderate pain.     albuterol (VENTOLIN HFA) 108 (90 Base) MCG/ACT inhaler INHALE TWO PUFFS BY MOUTH EVERY 4 HOURS AS NEEDED FOR WHEEZING 18 g 3   amitriptyline (ELAVIL) 25 MG tablet Take 1 tablet (25 mg total) by mouth at bedtime. 90 tablet 0   atenolol (TENORMIN) 50 MG tablet Take 1 tablet (50 mg total) by mouth 2 (two) times daily. 180 tablet 3   busPIRone (BUSPAR) 15 MG tablet Take 1 tablet (15 mg total) by mouth 3 (three) times daily. 90 tablet 2   diclofenac (VOLTAREN) 75 MG EC tablet Take 1 tablet (75 mg total) by mouth 2 (two) times daily. 60 tablet 0   estradiol (ESTRACE) 1 MG tablet Take 1 tablet (1 mg total) by mouth daily. 90 tablet 0   furosemide (LASIX) 20 MG tablet Take 1 tablet (20 mg total) by mouth daily as needed for edema. 30 tablet 3   ipratropium-albuterol (DUONEB) 0.5-2.5 (3) MG/3ML SOLN Take 3 mLs by nebulization every 2 (two) hours as needed (wheeze, SOB). 60 mL 3   lamoTRIgine (LAMICTAL) 100 MG tablet Take 1 tablet (100 mg total) by mouth daily. 30 tablet 2   MELATONIN PO Take by mouth.     methocarbamol (ROBAXIN) 500 MG tablet Take 1 tablet (500 mg total) by mouth 3 (three) times daily. 90 tablet 5   mirtazapine (REMERON) 30 MG tablet Take 1 tablet (30 mg total) by mouth at bedtime. 30 tablet 2   Multiple Vitamin (MULTIVITAMIN) capsule Take 1 capsule by mouth daily.     Nutritional Supplements (MENOPAUSE FORMULA PO) Take by mouth.     pantoprazole (PROTONIX) 40 MG tablet TAKE ONE TABLET BY MOUTH DAILY 90 tablet 2   potassium chloride (KLOR-CON M) 10 MEQ tablet To take one tablet with lasix as needed. 90 tablet 1  traMADol (ULTRAM) 50 MG tablet Take 1  tablet (50 mg total) by mouth every 8 (eight) hours as needed for moderate pain. 21 tablet 0   triamterene-hydrochlorothiazide (MAXZIDE-25) 37.5-25 MG tablet Take 1 tablet by mouth daily. 90 tablet 3   valACYclovir (VALTREX) 500 MG tablet Take 1 tablet (500 mg total) by mouth daily. 90 tablet 3   No current facility-administered medications for this visit.     Musculoskeletal:   Psychiatric Specialty Exam: Review of Systems  Constitutional: Negative.   Genitourinary: Negative.   Neurological: Negative.   Psychiatric/Behavioral:  Positive for decreased concentration and sleep disturbance.   All other systems reviewed and are negative.   Last menstrual period 07/28/2015.There is no height or weight on file to calculate BMI.  General Appearance: NA  Eye Contact:  NA  Speech:  Clear and Coherent  Volume:  Normal  Mood:  Anxious and Irritable  Affect:  Depressed  Thought Process:  Coherent  Orientation:  Full (Time, Place, and Person)  Thought Content: Logical   Suicidal Thoughts:  No  Homicidal Thoughts:  No  Memory:  Immediate;   Fair Recent;   Fair  Judgement:  Good  Insight:  Good  Psychomotor Activity:  Normal  Concentration:  Concentration: Good  Recall:  Good  Fund of Knowledge: Good  Language: Fair  Akathisia:  No  Handed:  Right  AIMS (if indicated): done  Assets:  Desire for Improvement Resilience Social Support  ADL's:  Intact  Cognition: WNL  Sleep:  Good   Screenings: GAD-7    Flowsheet Row Video Visit from 10/10/2021 in [facility] Video Visit from 08/13/2021 in [facility] Video Visit from 06/12/2021 in [facility] Video Visit from 05/02/2021 in [facility] Counselor from 03/13/2021 in [facility]  Total GAD-7 Score '11 13 15 20 17      '$ PHQ2-9    [place] Office Visit from 11/18/2021 in [facility] Video Visit from 10/10/2021 in [facility] Video Visit from 08/13/2021 in [facility] Video Visit from 06/12/2021 in [facility] Counselor from 06/09/2021 in [facility]  PHQ-2 Total Score 0 '2 3 3 2  '$ PHQ-9 Total Score -- '7 10 14 5      '$ Flowsheet Row Video Visit from 10/10/2021 in [facility] Video Visit from 08/13/2021 in [facility] Video Visit from 06/12/2021 in [place] No Risk No Risk No Risk        Assessment and Plan: Luree Palla is a 41 year old female that presents for medication management appointment.  Continues to report increased anxiety and depression symptoms-unchanged.  She reports ongoing stressors related to unemployment and financial concerns.  Discussed continuing to monitor mood irritability as she attributes increased stress and possible new medication.  Patient to follow-up with first available appointment.  Discussed discontinuing Remeron while taking Elavil.  she was receptive to plan.  Support encouragement reassurance was provided.  Collaboration of Care: Collaboration of Care: Medication Management AEB she reported recent medication adjustment when she was initiated on Elavil for sleep by her primary care provider -Education provided with discontinuing Remeron while taking Elavil due to risks of serotonin syndrome.   Patient/Guardian was advised Release of Information must be obtained prior to any record release in  order to collaborate their care with an outside provider. Patient/Guardian was advised if they have not already done so to contact the registration department to sign all necessary forms in order for [place] to release information regarding their care.   Consent: Patient/Guardian gives verbal consent for  treatment and assignment of benefits for services provided during this visit. Patient/Guardian expressed understanding and agreed to proceed.    Derrill Center, NP 01/20/2022, 4:41 PM

## 2022-01-22 ENCOUNTER — Encounter: Payer: Self-pay | Admitting: Physician Assistant

## 2022-01-22 DIAGNOSIS — Z5181 Encounter for therapeutic drug level monitoring: Secondary | ICD-10-CM

## 2022-01-22 MED ORDER — ESTRADIOL 1 MG PO TABS: 1.0000 mg | ORAL_TABLET | Freq: Every day | ORAL | 0 refills | Status: DC

## 2022-02-06 ENCOUNTER — Other Ambulatory Visit: Payer: Self-pay | Admitting: Neurology

## 2022-02-06 DIAGNOSIS — I10 Essential (primary) hypertension: Secondary | ICD-10-CM

## 2022-02-06 DIAGNOSIS — Z5181 Encounter for therapeutic drug level monitoring: Secondary | ICD-10-CM

## 2022-02-06 MED ORDER — ESTRADIOL 1 MG PO TABS: 1.0000 mg | ORAL_TABLET | Freq: Every day | ORAL | 1 refills | Status: DC

## 2022-02-06 MED ORDER — ATENOLOL 50 MG PO TABS: 50.0000 mg | ORAL_TABLET | Freq: Two times a day (BID) | ORAL | 1 refills | Status: DC

## 2022-02-06 MED ORDER — TRIAMTERENE-HCTZ 37.5-25 MG PO TABS: 1.0000 | ORAL_TABLET | Freq: Every day | ORAL | 1 refills | Status: DC

## 2022-02-06 MED ORDER — FUROSEMIDE 20 MG PO TABS: 20.0000 mg | ORAL_TABLET | Freq: Every day | ORAL | 0 refills | Status: DC | PRN

## 2022-02-06 MED ORDER — PANTOPRAZOLE SODIUM 40 MG PO TBEC: 40.0000 mg | DELAYED_RELEASE_TABLET | Freq: Every day | ORAL | 1 refills | Status: DC

## 2022-02-06 NOTE — Progress Notes (Signed)
Confirmed with patient that she would like to use [facility] pharmacy. Sent medications.

## 2022-02-11 ENCOUNTER — Telehealth (HOSPITAL_COMMUNITY): Payer: Self-pay | Admitting: *Deleted

## 2022-02-11 ENCOUNTER — Other Ambulatory Visit (HOSPITAL_COMMUNITY): Payer: Self-pay | Admitting: Psychiatry

## 2022-02-11 DIAGNOSIS — F4310 Post-traumatic stress disorder, unspecified: Secondary | ICD-10-CM

## 2022-02-11 DIAGNOSIS — F411 Generalized anxiety disorder: Secondary | ICD-10-CM

## 2022-02-11 DIAGNOSIS — F410 Panic disorder [episodic paroxysmal anxiety] without agoraphobia: Secondary | ICD-10-CM

## 2022-02-11 DIAGNOSIS — F3181 Bipolar II disorder: Secondary | ICD-10-CM

## 2022-02-11 MED ORDER — LAMOTRIGINE 100 MG PO TABS: 100.0000 mg | ORAL_TABLET | Freq: Every day | ORAL | 2 refills | Status: DC

## 2022-02-11 MED ORDER — BUSPIRONE HCL 15 MG PO TABS: 15.0000 mg | ORAL_TABLET | Freq: Three times a day (TID) | ORAL | 2 refills | Status: DC

## 2022-02-11 MED ORDER — MIRTAZAPINE 30 MG PO TABS: 30.0000 mg | ORAL_TABLET | Freq: Every day | ORAL | 3 refills | Status: DC

## 2022-02-11 NOTE — Telephone Encounter (Signed)
Margaret Lawson IS OUT OF OFFICE SENDING REFILL REQUEST TO Dr B. PARSONS  VERIFIED WITH PATIENT THAT SHE WOULD LIKE TO NOW USE [facility] 90 DAY SUPPLY:  LAMOTRIGINE 100 MG BUSPIRONE 15 MG MIRTAZAPINE 30 MG

## 2022-02-11 NOTE — Telephone Encounter (Signed)
Medication refilled and sent to preferred pharmacy

## 2022-02-18 ENCOUNTER — Other Ambulatory Visit: Payer: Self-pay | Admitting: Family Medicine

## 2022-02-18 ENCOUNTER — Ambulatory Visit (INDEPENDENT_AMBULATORY_CARE_PROVIDER_SITE_OTHER): Payer: No Typology Code available for payment source | Admitting: Family Medicine

## 2022-02-18 ENCOUNTER — Ambulatory Visit (INDEPENDENT_AMBULATORY_CARE_PROVIDER_SITE_OTHER): Payer: No Typology Code available for payment source

## 2022-02-18 ENCOUNTER — Encounter: Payer: Self-pay | Admitting: Family Medicine

## 2022-02-18 VITALS — BP 103/71 | HR 66 | Temp 98.6°F | Ht 68.0 in | Wt 285.0 lb

## 2022-02-18 DIAGNOSIS — R2231 Localized swelling, mass and lump, right upper limb: Secondary | ICD-10-CM | POA: Insufficient documentation

## 2022-02-18 DIAGNOSIS — Z1231 Encounter for screening mammogram for malignant neoplasm of breast: Secondary | ICD-10-CM | POA: Insufficient documentation

## 2022-02-18 DIAGNOSIS — H9202 Otalgia, left ear: Secondary | ICD-10-CM | POA: Diagnosis not present

## 2022-02-18 NOTE — Patient Instructions (Signed)
Take zyrtec and flonase  Can use a nasal saline wash

## 2022-02-18 NOTE — Assessment & Plan Note (Signed)
-   mobile mass likely indicative of a lipoma - have ordered an ultrasound for confirmation - breast exam normal and no signs of lymphadenopathy. No fevers so less likely this is related

## 2022-02-18 NOTE — Progress Notes (Signed)
Acute Office Visit  Subjective:     Patient ID: Margaret Lawson, female    DOB: [birth year], 41 y.o.   MRN: [id number]  Chief Complaint  Patient presents with   Mass    Right arm      Pt presents with "lump" on the left arm. She describes it as the size of a golf ball. She take estrogen for surgical menopause and is worried about risk of breast cancer. She denies fevers. Admits to fatigue. The last two weeks she has not felt well and been tired and needing naps and lack of interest or energy in doing things. Today, she is feeling better and having more energy.   She also has a recurrent issue in her left ear. The last month or two she has been taking sudafed and ibuprofen for otalgia. She says it has been hurting more often. Says her eardrum is white. She was referred to ENT, but has difficulty getting in with a clinic sooner. She has tried an antifungal, steroids, and antibiotics in her left ear. During this regimen she got a really bad GI bug that lasted a week and she did not complete the medication for longer than 5 days. For the past week, she has not taken zyrtec but has been using the flonase.     Review of Systems  Constitutional:  Positive for malaise/fatigue. Negative for chills and fever.  HENT:  Positive for ear pain.   Respiratory:  Negative for cough and shortness of breath.   Cardiovascular:  Negative for chest pain.  Neurological:  Negative for headaches.        Objective:    BP 103/71   Pulse 66   Temp 98.6 F (37 C)   Ht '5\' 8"'$  (1.727 m)   Wt 285 lb (129.3 kg)   LMP 07/28/2015   SpO2 96%   BMI 43.33 kg/m    Physical Exam Vitals and nursing note reviewed.  Constitutional:      General: She is not in acute distress.    Appearance: Normal appearance.  HENT:     Head: Normocephalic and atraumatic.     Right Ear: External ear normal.     Left Ear: External ear normal.     Nose: Nose normal.  Eyes:     Conjunctiva/sclera: Conjunctivae normal.   Cardiovascular:     Rate and Rhythm: Normal rate and regular rhythm.  Pulmonary:     Effort: Pulmonary effort is normal.     Breath sounds: Normal breath sounds.  Chest:  Breasts:    Right: Normal.     Left: Normal.     Comments: Breast exam normal. No signs of lymphadenopathy.  Musculoskeletal:     Comments: Mass of right upper extremity mobile, non-tender to palpation.   Neurological:     General: No focal deficit present.     Mental Status: She is alert and oriented to person, place, and time.  Psychiatric:        Mood and Affect: Mood normal.        Behavior: Behavior normal.        Thought Content: Thought content normal.        Judgment: Judgment normal.     No results found for any visits on 02/18/22.      Assessment & Plan:   Problem List Items Addressed This Visit       Other   Otalgia of left ear    - ear exam shows white TM  which can be suggestive of tympanosclerosis. Have encouraged her to make an apt with ENT for official diagnosis and recommendations - denies hearing loss  - without erythema of TM I don't believe this requires abx  - can continue ibuprofen for intermittent pain - recommend flonase and zrytec daily for pressure like feeling and even a normal nasal saline wash      Arm mass, right - Primary    - mobile mass likely indicative of a lipoma - have ordered an ultrasound for confirmation - breast exam normal and no signs of lymphadenopathy. No fevers so less likely this is related       Relevant Orders   [place] RT UPPER EXTREM LTD SOFT TISSUE NON VASCULAR   Screening mammogram for breast cancer   Relevant Orders   MM DIGITAL SCREENING BILATERAL    No orders of the defined types were placed in this encounter.   Return if symptoms worsen or fail to improve.  Owens Loffler, DO

## 2022-02-18 NOTE — Assessment & Plan Note (Signed)
-   ear exam shows white TM which can be suggestive of tympanosclerosis. Have encouraged her to make an apt with ENT for official diagnosis and recommendations - denies hearing loss  - without erythema of TM I don't believe this requires abx  - can continue ibuprofen for intermittent pain - recommend flonase and zrytec daily for pressure like feeling and even a normal nasal saline wash

## 2022-02-24 ENCOUNTER — Encounter: Payer: Self-pay | Admitting: Family Medicine

## 2022-02-25 ENCOUNTER — Other Ambulatory Visit: Payer: Self-pay | Admitting: Neurology

## 2022-02-25 DIAGNOSIS — N6452 Nipple discharge: Secondary | ICD-10-CM

## 2022-02-27 ENCOUNTER — Other Ambulatory Visit: Payer: Self-pay | Admitting: Physician Assistant

## 2022-02-27 DIAGNOSIS — N6452 Nipple discharge: Secondary | ICD-10-CM

## 2022-03-03 ENCOUNTER — Ambulatory Visit (INDEPENDENT_AMBULATORY_CARE_PROVIDER_SITE_OTHER): Payer: No Typology Code available for payment source

## 2022-03-03 DIAGNOSIS — R2231 Localized swelling, mass and lump, right upper limb: Secondary | ICD-10-CM

## 2022-03-03 MED ORDER — GADOBUTROL 1 MMOL/ML IV SOLN
10.0000 mL | Freq: Once | INTRAVENOUS | Status: AC | PRN
  Administered 2022-03-03: 10 mL via INTRAVENOUS

## 2022-03-12 ENCOUNTER — Ambulatory Visit: Payer: No Typology Code available for payment source

## 2022-03-12 ENCOUNTER — Ambulatory Visit
Admission: RE | Admit: 2022-03-12 | Discharge: 2022-03-12 | Disposition: A | Discharge status: Home / Self Care | Payer: No Typology Code available for payment source | Source: Ambulatory Visit | Attending: Physician Assistant | Admitting: Physician Assistant

## 2022-03-12 DIAGNOSIS — N6452 Nipple discharge: Secondary | ICD-10-CM

## 2022-03-13 NOTE — Progress Notes (Signed)
No suspicious findings in left breast. Are you still having nipple discharge?

## 2022-03-20 ENCOUNTER — Encounter: Payer: Self-pay | Admitting: Physician Assistant

## 2022-03-27 ENCOUNTER — Ambulatory Visit (INDEPENDENT_AMBULATORY_CARE_PROVIDER_SITE_OTHER): Payer: No Typology Code available for payment source | Admitting: Physician Assistant

## 2022-03-27 ENCOUNTER — Encounter: Payer: Self-pay | Admitting: Physician Assistant

## 2022-03-27 VITALS — BP 132/85 | HR 72 | Ht 68.0 in | Wt 286.0 lb

## 2022-03-27 DIAGNOSIS — I10 Essential (primary) hypertension: Secondary | ICD-10-CM | POA: Diagnosis not present

## 2022-03-27 DIAGNOSIS — E782 Mixed hyperlipidemia: Secondary | ICD-10-CM | POA: Diagnosis not present

## 2022-03-27 DIAGNOSIS — Z1329 Encounter for screening for other suspected endocrine disorder: Secondary | ICD-10-CM | POA: Diagnosis not present

## 2022-03-27 DIAGNOSIS — F1411 Cocaine abuse, in remission: Secondary | ICD-10-CM

## 2022-03-27 DIAGNOSIS — Z0000 Encounter for general adult medical examination without abnormal findings: Secondary | ICD-10-CM

## 2022-03-27 DIAGNOSIS — F411 Generalized anxiety disorder: Secondary | ICD-10-CM

## 2022-03-27 DIAGNOSIS — F410 Panic disorder [episodic paroxysmal anxiety] without agoraphobia: Secondary | ICD-10-CM | POA: Diagnosis not present

## 2022-03-27 DIAGNOSIS — F3181 Bipolar II disorder: Secondary | ICD-10-CM
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
## 2022-03-27 NOTE — Patient Instructions (Signed)
Start working on disability

## 2022-03-30 ENCOUNTER — Encounter: Payer: Self-pay | Admitting: Physician Assistant

## 2022-04-07 ENCOUNTER — Encounter: Payer: Self-pay | Admitting: Physician Assistant

## 2022-04-07 ENCOUNTER — Telehealth (INDEPENDENT_AMBULATORY_CARE_PROVIDER_SITE_OTHER): Payer: No Typology Code available for payment source | Admitting: Physician Assistant

## 2022-04-07 VITALS — Ht 68.0 in | Wt 286.0 lb

## 2022-04-07 DIAGNOSIS — F3181 Bipolar II disorder: Secondary | ICD-10-CM

## 2022-04-07 DIAGNOSIS — M797 Fibromyalgia: Secondary | ICD-10-CM

## 2022-04-07 DIAGNOSIS — F411 Generalized anxiety disorder: Secondary | ICD-10-CM

## 2022-04-07 DIAGNOSIS — F410 Panic disorder [episodic paroxysmal anxiety] without agoraphobia: Secondary | ICD-10-CM

## 2022-04-07 NOTE — Progress Notes (Signed)
..Virtual Visit via Video Note  I connected with Margaret Lawson on 04/08/22 at 11:30 AM EDT by a video enabled telemedicine application and verified that I am speaking with the correct person using two identifiers.  Location: Patient: home Provider: clinic  .Marland KitchenParticipating in visit:  Patient: Margaret Lawson  Provider: Iran Planas PA-C Provider in training: Elsie Lincoln PA-S   I discussed the limitations of evaluation and management by telemedicine and the availability of in person appointments. The patient expressed understanding and agreed to proceed.  History of Present Illness: Pt is a 41 year old obese female with bipolar 2, anxiety, major depression, panic attacks who needs another work Geneticist, molecular filled out.  We filled out 1 accommodation letter for [employer] however they needed some more specific information.  Patient is very easily triggered with her panic attacks due to stress, heat, conflict.  Patient actually sees behavioral health where she has tried numerous medications Effexor, Paxil, Zoloft, Lexapro, Cymbalta.  Medication that helped the most was [place].  She did not like the side effects.  She is very concerned about the side effects of antipsychotics.  .. 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   History of cholecystectomy 09/11/2010   GAD (generalized anxiety disorder) 09/10/2015   Hypokalemia 09/27/2015   Benign abdominal serous tumor 12/10/2015   Surgical menopause 04/23/2016   Cervical spondylosis without myelopathy 04/23/2016   Nipple discharge in female 08/03/2016    Anorectal skin tags 08/03/2016   Bipolar 2 disorder, major depressive episode ([place]) 11/13/2016   Frequent headaches 11/15/2016   Family history of brain aneurysm 11/15/2016   Moderate persistent asthma without complication 87/86/7672   Functional diarrhea 12/03/2016   Fatigue 02/13/2018   Inattention 02/13/2018   Hot flashes 02/26/2018   Panic attacks 03/30/2018   Insomnia 03/30/2018   Grief reaction 08/28/2019   Weight gain 08/28/2019   Hot flashes due to surgical menopause 08/28/2019   Female climacteric state 08/28/2019   PTSD (post-traumatic stress disorder) 01/10/2020   Bipolar disorder ([place]) 01/10/2020   Borderline personality disorder ([place]) 01/10/2020   Tachycardia 02/17/2021   Nausea 02/17/2021   Chronic fatigue 02/17/2021   Excessive sweating 02/17/2021   Flushing 02/17/2021   Acute left-sided low back pain without sciatica 10/20/2021   Tobacco use disorder 07/02/2018   Severe episode of recurrent major depressive disorder, without psychotic features ([place]) 07/01/2018   Cannabis abuse 06/29/2018   Other stimulant dependence, in remission ([place]) 07/08/2018   Nicotine dependence, unspecified, uncomplicated 09/47/0962   Cocaine abuse in remission ([place]) 07/08/2018   Cannabis dependence, uncomplicated ([place]) 83/66/2947   Alcohol dependence, uncomplicated ([place]) 65/46/5035   Left otitis media with effusion 11/19/2021   Hearing loss of left ear 11/19/2021   Abnormal exam of left ear 12/03/2021   Otalgia of left ear 12/05/2021   Arm mass, right 02/18/2022   Screening mammogram for breast cancer 02/18/2022   Resolved Ambulatory Problems    Diagnosis Date Noted   Asthma, chronic 12/27/2013   Perforation of left tympanic membrane 12/27/2013   Bipolar I disorder, most recent episode depressed ([place]) 10/11/2014   Ovarian cyst, right 07/15/2015   Amenorrhea 07/15/2015   Abnormal uterine bleeding (  AUB) 09/10/2015   Post-operative state 09/10/2015   Acute tonsillitis 04/23/2016    Loose stools 08/03/2016   No energy 11/13/2016   Acute non-recurrent maxillary sinusitis 12/03/2016   Past Medical History:  Diagnosis Date   Anxiety    Bipolar 2 disorder (HCC)    Headache    Low iron    PCOS (polycystic ovarian syndrome)    Poor dentition    Restless leg syndrome    Right leg pain    Teeth clenching    Teeth grinding    Weakness         Observations/Objective: No acute distress   Assessment and Plan: Marland KitchenMarland KitchenNovember was seen today for follow-up.  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 Desk/phone/computer work recommended with ability to sit and stand as needed to manage pain.    The duration of these accomodations are permanent/long term due to health diagnoses of fibromyalgia w/heat intolerance and anxiety/PTSD. The medications required for management of these conditions also contribute to easily overheating.  If you have any questions or concerns, please don't hesitate to call.  Discussed with patient to make appt with [facility] to have medications adjusted and to start disability paperwork.  Discussed vraylar as option for anti-psychotic with good SE profile.  Patient has a long history of not being able to keep down a job however I feel like there is a chance that the right medication came along that she might could have better anxiety and panic control and be able to keep a job.   Follow Up Instructions:    I discussed the assessment and treatment plan with the patient. The patient was provided an opportunity to ask questions and all were  answered. The patient agreed with the plan and demonstrated an understanding of the instructions.   The patient was advised to call back or seek an in-person evaluation if the symptoms worsen or if the condition fails to improve as anticipated.    Iran Planas, PA-C

## 2022-04-08 ENCOUNTER — Encounter: Payer: Self-pay | Admitting: Physician Assistant

## 2022-04-09 NOTE — Telephone Encounter (Signed)
Written in different message.

## 2022-04-22 ENCOUNTER — Encounter (HOSPITAL_COMMUNITY): Payer: Self-pay | Admitting: Physician Assistant

## 2022-04-22 ENCOUNTER — Telehealth (INDEPENDENT_AMBULATORY_CARE_PROVIDER_SITE_OTHER): Payer: No Typology Code available for payment source | Admitting: Physician Assistant

## 2022-04-22 DIAGNOSIS — F4310 Post-traumatic stress disorder, unspecified: Secondary | ICD-10-CM | POA: Diagnosis not present

## 2022-04-22 DIAGNOSIS — F410 Panic disorder [episodic paroxysmal anxiety] without agoraphobia: Secondary | ICD-10-CM | POA: Diagnosis not present

## 2022-04-22 DIAGNOSIS — F411 Generalized anxiety disorder: Secondary | ICD-10-CM | POA: Diagnosis not present

## 2022-04-22 DIAGNOSIS — F3181 Bipolar II disorder: Secondary | ICD-10-CM

## 2022-04-22 DIAGNOSIS — G4700 Insomnia, unspecified: Secondary | ICD-10-CM

## 2022-04-22 MED ORDER — LAMOTRIGINE 100 MG PO TABS: 100.0000 mg | ORAL_TABLET | Freq: Every day | ORAL | 2 refills | Status: DC

## 2022-04-22 MED ORDER — CARIPRAZINE HCL 1.5 MG PO CAPS: 1.5000 mg | ORAL_CAPSULE | Freq: Every day | ORAL | 1 refills | Status: DC

## 2022-04-22 MED ORDER — BUSPIRONE HCL 15 MG PO TABS: 15.0000 mg | ORAL_TABLET | Freq: Three times a day (TID) | ORAL | 2 refills | Status: DC

## 2022-04-22 MED ORDER — TRAZODONE HCL 50 MG PO TABS: 50.0000 mg | ORAL_TABLET | Freq: Every day | ORAL | 1 refills | Status: DC

## 2022-04-22 MED ORDER — MIRTAZAPINE 7.5 MG PO TABS: 7.5000 mg | ORAL_TABLET | Freq: Every day | ORAL | 0 refills | Status: DC

## 2022-04-22 NOTE — Progress Notes (Signed)
BH MD/PA/NP OP Progress Note  Virtual Visit via Video Note  I connected with Margaret Lawson on 04/22/22 at  2:00 PM EDT by a video enabled telemedicine application and verified that I am speaking with the correct person using two identifiers.  Location: Patient: Home Provider: Clinic   I discussed the limitations of evaluation and management by telemedicine and the availability of in person appointments. The patient expressed understanding and agreed to proceed.  Follow Up Instructions:   I discussed the assessment and treatment plan with the patient. The patient was provided an opportunity to ask questions and all were answered. The patient agreed with the plan and demonstrated an understanding of the instructions.   The patient was advised to call back or seek an in-person evaluation if the symptoms worsen or if the condition fails to improve as anticipated.  I provided 40 minutes of non-face-to-face time during this encounter.  [name], PA   04/22/2022 2:32 PM JABREA KALLSTROM  MRN:  [id number]  Chief Complaint:  Chief Complaint  Patient presents with   Follow-up   Medication Management   HPI:   Margaret Lawson is a 41 year old female with a past psychiatric history significant for generalized anxiety disorder, PTSD, panic attacks, and bipolar 2 disorder who presents to [facility] via virtual video visit for follow-up and medication management.  Patient was last seen on 01/20/2022 by Malachy Chamber. Ludger Nutting, NP.  During her encounter with Bobby Rumpf, NP, patient was encouraged to discontinue Remeron in place for Elavil to help aid in the patient's sleep.  Elavil 25 mg at bedtime Buspirone 15 mg 3 times daily Lamotrigine 100 mg daily  Patient reports to the provider that she is no longer taking Elavil due to the medication making her more irritable/restless.  Patient reports that she has started taking mirtazapine again.  Since  the last time she saw this provider, patient states that they had a job but was unable to do the job due to anxiety.  Patient states that she also acquired a job working for [employer] but experiences a panic attack after losing a package as well as suffering from heat exhaustion.  Although patient has not lost her job at [employer], she is she is currently on leave until the company can provide her with accommodations for the job position.  Patient endorses really bad depression as well as weight gain that she attributes to the use of Remeron.  Patient states that she has a history of taking trazodone but does not quite remember if the medication was helpful in managing her sleep.  Patient states that her primary care provider consulted with her about the use of Vraylar.  Patient has a history of being fearful of using antipsychotics due to the risk of TD.  Patient states that she has used risperidone in the past while being hospitalized and noted that everything felt brighter while on the medication.  Patient endorses racing thoughts and states that she is unable to stop her brain from racing.  Patient also notes that she has found it difficult to function and has been avoiding activities of daily living.  Patient adds that she does not have a regular sleep/wake schedule and may not go to bed until 1 or 3 in the morning.  Patient rates her anxiety at 4 5 out of 10, but when working, her anxiety is a 7 out of 10.  Patient endorses depression 5 out of 7 days of the  week.  Patient's depressive episodes are characterized by the following symptoms: lack of motivation, neglecting activities of daily living, and excessive worrying.  In an attempt to combat her symptoms, patient has taken up journaling, yoga, and meditation.  Patient has found some alleviation of her symptoms when engaging in these activities.  Patient's current stressors include financial instability, transportation problems, and lack of steady income.  A  PHQ-9 screen was performed with the patient scoring a 15.  A GAD-7 screen was also performed with the patient scoring an 18.  Patient is alert and oriented x4, calm, cooperative, and fully engaged in conversation during the encounter.  Patient endorses okay mood at this time.  Patient denies suicidal ideations.  She denies a desire to hurt herself but states that she would not be upset if she were to disappear.  Patient states that she feels as though she is a burden to other people.  Patient denies homicidal ideations patient further denies auditory or visual hallucinations and does not appear to be responding to internal/external stimuli.  Patient endorses poor sleep and receives on average 5 to 6 hours of intermittent sleep.  Patient endorses decreased appetite and eats on average 1-2 meals per day.  Patient also states that her appetite will fluctuate from not eating anything to eating everything in sight.  Patient denies alcohol consumption and illicit drug use.  Patient denies tobacco use but does engage in vaping.  Visit Diagnosis:    ICD-10-CM   1. Bipolar 2 disorder, major depressive episode (HCC)  F31.81 cariprazine (VRAYLAR) 1.5 MG capsule    lamoTRIgine (LAMICTAL) 100 MG tablet    2. Generalized anxiety disorder  F41.1 busPIRone (BUSPAR) 15 MG tablet    mirtazapine (REMERON) 7.5 MG tablet    3. PTSD (post-traumatic stress disorder)  F43.10 busPIRone (BUSPAR) 15 MG tablet    4. Panic attacks  F41.0 busPIRone (BUSPAR) 15 MG tablet    5. Insomnia, unspecified type  G47.00 traZODone (DESYREL) 50 MG tablet      Past Psychiatric History:  PTSD Panic Attacks Generalized anxiety disorders Bipolar 2 disorder  Past Medical History:  Past Medical History:  Diagnosis Date   Anxiety    severe   Asthma, chronic 12/27/2013   Bipolar 2 disorder ([place])    Essential hypertension, benign 12/27/2013   Fibromyalgia 12/27/2013   GERD (gastroesophageal reflux disease)    Headache    migraines,  over stimulation   Leukocytosis 12/27/2013   Low iron    PCOS (polycystic ovarian syndrome)    Perforation of left tympanic membrane 12/27/2013   Poor dentition    right side upper and lower   Restless leg syndrome    Right leg pain    Teeth clenching    Teeth grinding    Weakness    when exposed to hot or cold temperatures    Past Surgical History:  Procedure Laterality Date   CHOLECYSTECTOMY     ROBOTIC ASSISTED TOTAL HYSTERECTOMY WITH BILATERAL SALPINGO OOPHERECTOMY Bilateral 09/10/2015   Procedure: ROBOTIC ASSISTED TOTAL HYSTERECTOMY WITH BILATERAL SALPINGECTOMY;  Surgeon: Lavonia Drafts, MD;  Location: [facility];  Service: Gynecology;  Laterality: Bilateral;   WISDOM TOOTH EXTRACTION      Family Psychiatric History:  Dad - Depression Sister - Depression  Family History:  Family History  Problem Relation Age of Onset   Aneurysm Mother        brain   Depression Father    Heart disease Father    Mitral valve prolapse  Father    Diabetes Father    Hypertension Father    Depression Sister    Breast cancer Paternal Aunt        36s   Lung cancer Maternal Grandfather    Breast cancer Paternal Grandmother        25s    Social History:  Social History   Socioeconomic History   Marital status: Married    Spouse name: Not on file   Number of children: Not on file   Years of education: Not on file   Highest education level: Not on file  Occupational History   Occupation: unemployed  Tobacco Use   Smoking status: Every Day    Packs/day: 1.00    Years: 20.00    Total pack years: 20.00    Types: Cigarettes   Smokeless tobacco: Never   Tobacco comments:    uses '6mg'$  vapes  Substance and Sexual Activity   Alcohol use: No    Alcohol/week: 0.0 standard drinks of alcohol   Drug use: No   Sexual activity: Yes    Partners: Male    Birth control/protection: None  Other Topics Concern   Not on file  Social History Narrative   Not on file   Social Determinants of  Health   Financial Resource Strain: Low Risk  (01/18/2020)   Overall Financial Resource Strain (CARDIA)    Difficulty of Paying Living Expenses: Not hard at all  Food Insecurity: No Food Insecurity (01/09/2021)   Hunger Vital Sign    Worried About Running Out of Food in the Last Year: Never true    [place] in the Last Year: Never true  Transportation Needs: No Transportation Needs (01/18/2020)   PRAPARE - Hydrologist (Medical): No    Lack of Transportation (Non-Medical): No  Physical Activity: Inactive (01/09/2021)   Exercise Vital Sign    Days of Exercise per Week: 0 days    Minutes of Exercise per Session: 0 min  Stress: No Stress Concern Present (01/09/2021)   [place]    Feeling of Stress : Only a little  Social Connections: Moderately Isolated (01/09/2021)   Social Connection and Isolation Panel [NHANES]    Frequency of Communication with Friends and Family: More than three times a week    Frequency of Social Gatherings with Friends and Family: Twice a week    Attends Religious Services: Never    Marine scientist or Organizations: No    Attends Archivist Meetings: Never    Marital Status: Married    Allergies:  Allergies  Allergen Reactions   Clarithromycin Nausea And Vomiting   Adhesive [Tape] Other (See Comments)    Blisters, paper tape is worse   Ambien [Zolpidem Tartrate]     Insomnia.   Celecoxib Nausea And Vomiting   Doxycycline     Double vision    Erythromycin Nausea And Vomiting   Lyrica [Pregabalin]     Induced lactation   Morphine And Related     Decrease bp   Penicillins Hives    Has patient had a PCN reaction causing immediate rash, facial/tongue/throat swelling, SOB or lightheadedness with hypotension: Yes Has patient had a PCN reaction causing severe rash involving mucus membranes or skin necrosis: No Has patient had a PCN reaction  that required hospitalization No Has patient had a PCN reaction occurring within the last 10 years: Yes If all of the  above answers are "NO", then may proceed with Cephalosporin use.    Latex Rash    Metabolic Disorder Labs: Lab Results  Component Value Date   HGBA1C 5.8 (H) 06/25/2015   MPG 120 (H) 06/25/2015   No results found for: "PROLACTIN" Lab Results  Component Value Date   CHOL 213 (H) 02/27/2015   TRIG 210 (H) 02/27/2015   HDL 34 (L) 02/27/2015   CHOLHDL 6.3 (H) 02/27/2015   VLDL 42 (H) 02/27/2015   LDLCALC 137 (H) 02/27/2015   LDLCALC 116 (H) 01/02/2014   Lab Results  Component Value Date   TSH 1.29 11/13/2016   TSH 1.748 02/27/2015    Therapeutic Level Labs: No results found for: "LITHIUM" No results found for: "VALPROATE" No results found for: "CBMZ"  Current Medications: Current Outpatient Medications  Medication Sig Dispense Refill   cariprazine (VRAYLAR) 1.5 MG capsule Take 1 capsule (1.5 mg total) by mouth daily. 30 capsule 1   traZODone (DESYREL) 50 MG tablet Take 1 tablet (50 mg total) by mouth at bedtime. 30 tablet 1   acetaminophen (TYLENOL) 500 MG tablet Take 500 mg by mouth every 6 (six) hours as needed for mild pain or moderate pain.     albuterol (VENTOLIN HFA) 108 (90 Base) MCG/ACT inhaler INHALE TWO PUFFS BY MOUTH EVERY 4 HOURS AS NEEDED FOR WHEEZING 18 g 3   atenolol (TENORMIN) 50 MG tablet Take 1 tablet (50 mg total) by mouth 2 (two) times daily. 180 tablet 1   busPIRone (BUSPAR) 15 MG tablet Take 1 tablet (15 mg total) by mouth 3 (three) times daily. 90 tablet 2   estradiol (ESTRACE) 1 MG tablet Take 1 tablet (1 mg total) by mouth daily. 90 tablet 1   furosemide (LASIX) 20 MG tablet Take 1 tablet (20 mg total) by mouth daily as needed for edema. 90 tablet 0   ipratropium-albuterol (DUONEB) 0.5-2.5 (3) MG/3ML SOLN Take 3 mLs by nebulization every 2 (two) hours as needed (wheeze, SOB). 60 mL 3   lamoTRIgine (LAMICTAL) 100 MG tablet Take 1  tablet (100 mg total) by mouth daily. 30 tablet 2   MELATONIN PO Take by mouth.     methocarbamol (ROBAXIN) 500 MG tablet Take 1 tablet (500 mg total) by mouth 3 (three) times daily. 90 tablet 5   mirtazapine (REMERON) 7.5 MG tablet Take 1 tablet (7.5 mg total) by mouth at bedtime. 7 tablet 0   Multiple Vitamin (MULTIVITAMIN) capsule Take 1 capsule by mouth daily.     Nutritional Supplements (MENOPAUSE FORMULA PO) Take by mouth.     pantoprazole (PROTONIX) 40 MG tablet Take 1 tablet (40 mg total) by mouth daily. 90 tablet 1   potassium chloride (KLOR-CON M) 10 MEQ tablet To take one tablet with lasix as needed. 90 tablet 1   traMADol (ULTRAM) 50 MG tablet Take 1 tablet (50 mg total) by mouth every 8 (eight) hours as needed for moderate pain. 21 tablet 0   triamterene-hydrochlorothiazide (MAXZIDE-25) 37.5-25 MG tablet Take 1 tablet by mouth daily. 90 tablet 1   No current facility-administered medications for this visit.     Musculoskeletal: Strength & Muscle Tone: within normal limits Gait & Station: normal Patient leans: n/a  Psychiatric Specialty Exam: Review of Systems  Psychiatric/Behavioral:  Positive for sleep disturbance. Negative for decreased concentration, dysphoric mood, hallucinations, self-injury and suicidal ideas. The patient is nervous/anxious. The patient is not hyperactive.     Last menstrual period 07/28/2015.There is no height or weight on file  to calculate BMI.  General Appearance: Casual  Eye Contact:  Good  Speech:  Clear and Coherent and Normal Rate  Volume:  Normal  Mood:  Anxious and Depressed  Affect:  Congruent  Thought Process:  Coherent and Descriptions of Associations: Intact  Orientation:  Full (Time, Place, and Person)  Thought Content: WDL   Suicidal Thoughts:  No  Homicidal Thoughts:  No  Memory:  Immediate;   Good Recent;   Good Remote;   Good  Judgement:  Good  Insight:  Good  Psychomotor Activity:  Normal  Concentration:  Concentration:  Good and Attention Span: Good  Recall:  Good  Fund of Knowledge: Good  Language: Good  Akathisia:  Negative  Handed:  Right  AIMS (if indicated): not done  Assets:  Communication Skills Desire for Improvement Financial Resources/Insurance Housing  ADL's:  Intact  Cognition: WNL  Sleep:  Fair   Screenings: GAD-7    Flowsheet Row Video Visit from 04/22/2022 in [facility] Office Visit from 03/27/2022 in [place] Video Visit from 10/10/2021 in [facility] Video Visit from 08/13/2021 in [facility] Video Visit from 06/12/2021 in [facility]  Total GAD-7 Score '18 14 11 13 15      '$ PHQ2-9    Flowsheet Row Video Visit from 04/22/2022 in [facility] Office Visit from 03/27/2022 in [place] Office Visit from 11/18/2021 in [place] Video Visit from 10/10/2021 in [facility] Video Visit from 08/13/2021 in [facility]  PHQ-2 Total Score 4 3 0 2 3  PHQ-9 Total Score 15 10 -- 7 10      Flowsheet Row Video Visit from 04/22/2022 in [facility] Video Visit from 10/10/2021 in [facility] Video Visit from 08/13/2021 in [place] No Risk No Risk        Assessment and Plan:   Margaret Lawson is a 41 year old female with a past psychiatric history significant for generalized anxiety disorder, PTSD, panic attacks, and bipolar 2 disorder who presents to [facility] via virtual video visit for follow-up and medication management.  Patient presents to the encounter struggling with anxiety and depression as well as issues with her sleep.   Patient was previously prescribed Elavil 25 mg at bedtime to aid in her sleep; however, patient only felt irritable and restless while taking the medication.  Patient discontinued the use of Elavil and went back to taking Remeron but still has trouble with sleep.  Patient has a history of taking trazodone but is unsure if the medication was effective in managing her sleep issues.    Patient informed provider that she spoke with her primary care provider about being placed on Vraylar.  Patient appears to be somewhat hesitant with trying out the medication due to fear of experiencing TD while taking the medication.  Patient does have a history of being on risperidone and felt that the medication was helpful at the time of taking it.  To combat her sleep issues, provider recommended rechallenging trazodone.  Patient appeared receptive to the recommendation.  While taking trazodone, provider informed patient to taper off mirtazapine.  Provider recommended taking 15 mg of mirtazapine for a week, followed by 7.5 mg for  a week before discontinuing.  Patient was agreeable to recommendation.  Patient was encouraged to try out Vraylar for the management of symptoms related to her bipolar 2 disorder.  Patient was recommended Vraylar 1.5 mg daily.  Patient to continue taking all other medications as prescribed.  Patient's medications to be e-prescribed to pharmacy of choice.  Collaboration of Care: Collaboration of Care: Medication Management AEB provider managing patient's psychiatric medications, Psychiatrist AEB patient being followed by a mental health provider, and Referral or follow-up with counselor/therapist AEB being seen by a licensed clinical social worker at this facility  Patient/Guardian was advised Release of Information must be obtained prior to any record release in order to collaborate their care with an outside provider. Patient/Guardian was advised if they have not already done so to contact the  registration department to sign all necessary forms in order for [place] to release information regarding their care.   Consent: Patient/Guardian gives verbal consent for treatment and assignment of benefits for services provided during this visit. Patient/Guardian expressed understanding and agreed to proceed.   1. Bipolar 2 disorder, major depressive episode (HCC)  - cariprazine (VRAYLAR) 1.5 MG capsule; Take 1 capsule (1.5 mg total) by mouth daily.  Dispense: 30 capsule; Refill: 1 - lamoTRIgine (LAMICTAL) 100 MG tablet; Take 1 tablet (100 mg total) by mouth daily.  Dispense: 30 tablet; Refill: 2  2. Generalized anxiety disorder  - busPIRone (BUSPAR) 15 MG tablet; Take 1 tablet (15 mg total) by mouth 3 (three) times daily.  Dispense: 90 tablet; Refill: 2 - mirtazapine (REMERON) 7.5 MG tablet; Take 1 tablet (7.5 mg total) by mouth at bedtime.  Dispense: 7 tablet; Refill: 0  3. PTSD (post-traumatic stress disorder)  - busPIRone (BUSPAR) 15 MG tablet; Take 1 tablet (15 mg total) by mouth 3 (three) times daily.  Dispense: 90 tablet; Refill: 2  4. Panic attacks  - busPIRone (BUSPAR) 15 MG tablet; Take 1 tablet (15 mg total) by mouth 3 (three) times daily.  Dispense: 90 tablet; Refill: 2  5. Insomnia, unspecified type  - traZODone (DESYREL) 50 MG tablet; Take 1 tablet (50 mg total) by mouth at bedtime.  Dispense: 30 tablet; Refill: 1  Patient to follow-up in 6 weeks Provider spent a total of 40 minutes with the patient/reviewing patient's chart  [name], PA 04/22/2022, 2:32 PM

## 2022-05-12 ENCOUNTER — Encounter: Payer: Self-pay | Admitting: Physician Assistant

## 2022-05-26 ENCOUNTER — Encounter: Payer: Self-pay | Admitting: Physician Assistant

## 2022-06-01 ENCOUNTER — Encounter: Payer: Self-pay | Admitting: Physician Assistant

## 2022-06-01 ENCOUNTER — Telehealth (INDEPENDENT_AMBULATORY_CARE_PROVIDER_SITE_OTHER): Payer: No Typology Code available for payment source | Admitting: Physician Assistant

## 2022-06-01 DIAGNOSIS — K5903 Drug induced constipation: Secondary | ICD-10-CM | POA: Diagnosis not present

## 2022-06-01 DIAGNOSIS — F3181 Bipolar II disorder: Secondary | ICD-10-CM

## 2022-06-01 DIAGNOSIS — M797 Fibromyalgia: Secondary | ICD-10-CM | POA: Diagnosis not present

## 2022-06-01 DIAGNOSIS — F410 Panic disorder [episodic paroxysmal anxiety] without agoraphobia: Secondary | ICD-10-CM

## 2022-06-01 DIAGNOSIS — I10 Essential (primary) hypertension: Secondary | ICD-10-CM

## 2022-06-01 DIAGNOSIS — F411 Generalized anxiety disorder: Secondary | ICD-10-CM

## 2022-06-01 NOTE — Progress Notes (Unsigned)
..Virtual Visit via Video Note  I connected with Margaret Lawson on 06/01/22 at  1:00 PM EST by a video enabled telemedicine application and verified that I am speaking with the correct person using two identifiers.  Location: Patient: home Provider: clinic  .Marland KitchenParticipating in visit:  Patient: Margaret Lawson Provider: Iran Planas PA-C   I discussed the limitations of evaluation and management by telemedicine and the availability of in person appointments. The patient expressed understanding and agreed to proceed.  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   She feels like overall she is doing well but feels like she cannot make it the full 8 hour shift. She filled for accommodations on 11/6 to be reviewed and she is wanting it changed to no more than 6 hour shift and 30 hours a week.   She has been tapering off Vraylar and one day when 3 days without it and noticed a huge difference in how she felt. Much less motivated and more down. She was thinking that her mouth movement and soreness was coming from vraylar but she now does not think so. She wants to stay on it.   .. 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/10/2015   Surgical menopause 04/23/2016   Cervical spondylosis without myelopathy 04/23/2016   Nipple discharge in female  08/03/2016   Anorectal skin tags 08/03/2016   Bipolar 2 disorder, major depressive episode ([place]) 11/13/2016   Frequent headaches 11/15/2016   Family history of brain aneurysm 11/15/2016   Moderate persistent asthma without complication 64/40/3474   Functional diarrhea 12/03/2016   Fatigue 02/13/2018   Inattention 02/13/2018   Hot flashes 02/26/2018   Panic attacks 03/30/2018   Insomnia 03/30/2018   Grief reaction 08/28/2019   Weight gain 08/28/2019   Hot flashes due to surgical menopause 08/28/2019   Female climacteric state 08/28/2019   PTSD (post-traumatic stress disorder) 01/10/2020   Bipolar disorder ([place]) 01/10/2020   Borderline personality disorder ([place]) 01/10/2020   Tachycardia 02/17/2021   Nausea 02/17/2021   Chronic fatigue 02/17/2021   Excessive sweating 02/17/2021   Flushing 02/17/2021   Acute left-sided low back pain without sciatica 10/20/2021   Tobacco use disorder 07/02/2018   Severe episode of recurrent major depressive disorder, without psychotic features ([place]) 07/01/2018   Cannabis abuse 06/29/2018   Other stimulant dependence, in remission ([place]) 07/08/2018   Nicotine dependence, unspecified, uncomplicated 25/95/6387   Cocaine abuse in remission ([place]) 07/08/2018   Cannabis dependence, uncomplicated ([place]) 56/43/3295   Alcohol dependence, uncomplicated ([place]) 18/84/1660   Left otitis media with effusion 11/19/2021   Hearing loss of left ear 11/19/2021   Abnormal exam of left ear 12/03/2021   Otalgia of left ear 12/05/2021   Arm mass, right 02/18/2022   Screening mammogram for breast cancer 02/18/2022   Resolved Ambulatory Problems    Diagnosis Date Noted   Asthma, chronic 12/27/2013   Perforation of left tympanic membrane  12/27/2013   Bipolar I disorder, most recent episode depressed ([place]) 10/11/2014   Ovarian cyst, right 07/15/2015   Amenorrhea 07/15/2015   Abnormal uterine bleeding (AUB) 09/10/2015   Post-operative state 09/10/2015   Acute tonsillitis  04/23/2016   Loose stools 08/03/2016   No energy 11/13/2016   Acute non-recurrent maxillary sinusitis 12/03/2016   Past Medical History:  Diagnosis Date   Anxiety    Bipolar 2 disorder (HCC)    Headache    Low iron    PCOS (polycystic ovarian syndrome)    Poor dentition    Restless leg syndrome    Right leg pain    Teeth clenching    Teeth grinding    Weakness        Observations/Objective: No acute distress Normal mood and appearance  Assessment and Plan: Marland KitchenMarland KitchenAleza was seen today for follow-up.  Diagnoses and all orders for this visit:  Bipolar 2 disorder, major depressive episode ([place])  Drug-induced constipation  GAD (generalized anxiety disorder)  Fibromyalgia  Essential hypertension, benign  Panic attacks   Ok to accommodations for [employer].  Letter Written in EMR.  Ok to stay on vraylar-needs to follow up with [facility]    Follow Up Instructions:    I discussed the assessment and treatment plan with the patient. The patient was provided an opportunity to ask questions and all were answered. The patient agreed with the plan and demonstrated an understanding of the instructions.   The patient was advised to call back or seek an in-person evaluation if the symptoms worsen or if the condition fails to improve as anticipated.    Iran Planas, PA-C

## 2022-06-07 ENCOUNTER — Ambulatory Visit
Admission: EM | Admit: 2022-06-07 | Discharge: 2022-06-07 | Disposition: A | Discharge status: Home / Self Care | Payer: No Typology Code available for payment source | Attending: Physician Assistant | Admitting: Physician Assistant

## 2022-06-07 DIAGNOSIS — K047 Periapical abscess without sinus: Secondary | ICD-10-CM | POA: Diagnosis not present

## 2022-06-07 MED ORDER — FLUCONAZOLE 150 MG PO TABS: ORAL_TABLET | ORAL | 0 refills | Status: DC

## 2022-06-07 MED ORDER — CLINDAMYCIN HCL 300 MG PO CAPS: 300.0000 mg | ORAL_CAPSULE | Freq: Three times a day (TID) | ORAL | 0 refills | Status: AC

## 2022-06-07 NOTE — ED Triage Notes (Signed)
Pt presents with left side dental/ oral swelling & pain X 3 days.

## 2022-06-07 NOTE — ED Provider Notes (Signed)
[facility]    CSN: [id number] Arrival date & time: 06/07/22  1122      History   Chief Complaint Chief Complaint  Patient presents with   Oral Swelling    HPI Margaret Lawson is a 41 y.o. female.   Here today for evaluation of left-sided dental pain and swelling that started 3 days ago and has progressively worsened with time.  She has not had fever.  She denies any nausea or vomiting.  She states that pain is not constant however does worsen with talking and chewing.  She has not had any treatment for symptoms.  The history is provided by the patient.    Past Medical History:  Diagnosis Date   Anxiety    severe   Asthma, chronic 12/27/2013   Bipolar 2 disorder ([place])    Essential hypertension, benign 12/27/2013   Fibromyalgia 12/27/2013   GERD (gastroesophageal reflux disease)    Headache    migraines, over stimulation   Leukocytosis 12/27/2013   Low iron    PCOS (polycystic ovarian syndrome)    Perforation of left tympanic membrane 12/27/2013   Poor dentition    right side upper and lower   Restless leg syndrome    Right leg pain    Teeth clenching    Teeth grinding    Weakness    when exposed to hot or cold temperatures    Patient Active Problem List   Diagnosis Date Noted   Arm mass, right 02/18/2022   Screening mammogram for breast cancer 02/18/2022   Otalgia of left ear 12/05/2021   Abnormal exam of left ear 12/03/2021   Left otitis media with effusion 11/19/2021   Hearing loss of left ear 11/19/2021   Acute left-sided low back pain without sciatica 10/20/2021   Tachycardia 02/17/2021   Nausea 02/17/2021   Chronic fatigue 02/17/2021   Excessive sweating 02/17/2021   Flushing 02/17/2021   PTSD (post-traumatic stress disorder) 01/10/2020   Bipolar disorder ([place]) 01/10/2020   Borderline personality disorder ([place]) 01/10/2020   Grief reaction 08/28/2019   Weight gain 08/28/2019   Hot flashes due to surgical menopause 08/28/2019   Female  climacteric state 08/28/2019   Other stimulant dependence, 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   Nicotine dependence, unspecified, uncomplicated 35/00/9381   Cocaine abuse in remission ([place]) 07/08/2018   Cannabis dependence, uncomplicated ([place]) 82/99/3716   Alcohol dependence, uncomplicated ([place]) 96/78/9381   Tobacco use disorder 07/02/2018   Severe episode of recurrent major depressive disorder, without psychotic features ([place]) 07/01/2018   Cannabis abuse 06/29/2018   Panic attacks 03/30/2018   Insomnia 03/30/2018   Hot flashes 02/26/2018   Fatigue 02/13/2018   Inattention 02/13/2018   Moderate persistent asthma without complication 01/75/1025   Functional diarrhea 12/03/2016   Frequent headaches 11/15/2016   Family history of brain aneurysm 11/15/2016   Bipolar 2 disorder, major depressive episode ([place]) 11/13/2016   Nipple discharge in female 08/03/2016   Anorectal skin tags 08/03/2016   Surgical menopause 04/23/2016   Cervical spondylosis without myelopathy 04/23/2016   Benign abdominal serous tumor 12/10/2015   Hypokalemia 09/27/2015   Generalized anxiety disorder 09/10/2015   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   Other migraine without status migrainosus, not intractable 03/07/2015   Hyperlipidemia 01/03/2014   History of nephrolithiasis 12/27/2013   Essential hypertension, benign 12/27/2013   Fibromyalgia 12/27/2013   Muscle spasm 12/27/2013   GERD (gastroesophageal reflux disease) 12/27/2013   Leukocytosis 12/27/2013   History of cholecystectomy 09/11/2010  Past Surgical History:  Procedure Laterality Date   CHOLECYSTECTOMY     ROBOTIC ASSISTED TOTAL HYSTERECTOMY WITH BILATERAL SALPINGO OOPHERECTOMY Bilateral 09/10/2015   Procedure: ROBOTIC ASSISTED TOTAL HYSTERECTOMY WITH BILATERAL SALPINGECTOMY;  Surgeon: Lavonia Drafts, MD;  Location: [facility];  Service: Gynecology;  Laterality: Bilateral;   WISDOM TOOTH EXTRACTION      OB History     Gravida   0   Para  0   Term  0   Preterm  0   AB  0   Living  0      SAB  0   IAB  0   Ectopic  0   Multiple  0   Live Births               Home Medications    Prior to Admission medications   Medication Sig Start Date End Date Taking? Authorizing Provider  clindamycin (CLEOCIN) 300 MG capsule Take 1 capsule (300 mg total) by mouth 3 (three) times daily for 7 days. 06/07/22 06/14/22 Yes Francene Finders, PA-C  fluconazole (DIFLUCAN) 150 MG tablet Take one tab PO today and repeat dose in 3 days if symptoms persist 06/07/22  Yes Francene Finders, PA-C  acetaminophen (TYLENOL) 500 MG tablet Take 500 mg by mouth every 6 (six) hours as needed for mild pain or moderate pain.    [provider]  albuterol (VENTOLIN HFA) 108 (90 Base) MCG/ACT inhaler INHALE TWO PUFFS BY MOUTH EVERY 4 HOURS AS NEEDED FOR WHEEZING 03/25/20   Breeback, Jade L, PA-C  atenolol (TENORMIN) 50 MG tablet Take 1 tablet (50 mg total) by mouth 2 (two) times daily. 02/06/22   Breeback, Jade L, PA-C  busPIRone (BUSPAR) 15 MG tablet Take 1 tablet (15 mg total) by mouth 3 (three) times daily. 04/22/22   Nwoko, Terese Door, PA  cariprazine (VRAYLAR) 1.5 MG capsule Take 1 capsule (1.5 mg total) by mouth daily. 04/22/22   Nwoko, Terese Door, PA  estradiol (ESTRACE) 1 MG tablet Take 1 tablet (1 mg total) by mouth daily. 02/06/22   Breeback, Jade L, PA-C  furosemide (LASIX) 20 MG tablet Take 1 tablet (20 mg total) by mouth daily as needed for edema. 02/06/22   Breeback, Jade L, PA-C  ipratropium-albuterol (DUONEB) 0.5-2.5 (3) MG/3ML SOLN Take 3 mLs by nebulization every 2 (two) hours as needed (wheeze, SOB). 11/03/19   Orma Render, NP  lamoTRIgine (LAMICTAL) 100 MG tablet Take 1 tablet (100 mg total) by mouth daily. 04/22/22   Nwoko, Isidoro Donning E, PA  MELATONIN PO Take by mouth.    [provider]  methocarbamol (ROBAXIN) 500 MG tablet Take 1 tablet (500 mg total) by mouth 3 (three) times daily. 10/20/21   Breeback, Royetta Car, PA-C  Multiple Vitamin (MULTIVITAMIN) capsule Take 1 capsule by mouth daily.    [provider]  pantoprazole (PROTONIX) 40 MG tablet Take 1 tablet (40 mg total) by mouth daily. 02/06/22   Breeback, Luvenia Starch L, PA-C  potassium chloride (KLOR-CON M) 10 MEQ tablet To take one tablet with lasix as needed. 10/20/21   Breeback, Royetta Car, PA-C  traMADol (ULTRAM) 50 MG tablet Take 1 tablet (50 mg total) by mouth every 8 (eight) hours as needed for moderate pain. 10/20/21   Breeback, Royetta Car, PA-C  traZODone (DESYREL) 50 MG tablet Take 1 tablet (50 mg total) by mouth at bedtime. 04/22/22   Nwoko, Terese Door, PA  triamterene-hydrochlorothiazide (MAXZIDE-25) 37.5-25 MG tablet Take 1 tablet by mouth  daily. 02/06/22   Donella Stade, PA-C    Family History Family History  Problem Relation Age of Onset   Aneurysm Mother        brain   Depression Father    Heart disease Father    Mitral valve prolapse Father    Diabetes Father    Hypertension Father    Depression Sister    Breast cancer Paternal Aunt        53s   Lung cancer Maternal Grandfather    Breast cancer Paternal Grandmother        72s    Social History Social History   Tobacco Use   Smoking status: Every Day    Packs/day: 1.00    Years: 20.00    Total pack years: 20.00    Types: Cigarettes   Smokeless tobacco: Never   Tobacco comments:    uses '6mg'$  vapes  Substance Use Topics   Alcohol use: No    Alcohol/week: 0.0 standard drinks of alcohol   Drug use: No     Allergies   Clarithromycin, Adhesive [tape], Ambien [zolpidem tartrate], Celecoxib, Doxycycline, Erythromycin, Lyrica [pregabalin], Morphine and related, Penicillins, and Latex   Review of Systems Review of Systems  Constitutional:  Negative for chills and fever.  HENT:  Positive for dental problem and facial swelling.   Eyes:  Negative for discharge and redness.  Respiratory:  Negative for shortness of breath.   Gastrointestinal:  Negative for abdominal pain,  nausea and vomiting.     Physical Exam Triage Vital Signs ED Triage Vitals  Enc Vitals Group     BP 06/07/22 1243 124/80     Pulse Rate 06/07/22 1243 (!) 55     Resp 06/07/22 1243 18     Temp 06/07/22 1243 97.9 F (36.6 C)     Temp Source 06/07/22 1243 Oral     SpO2 06/07/22 1243 96 %     Weight --      Height --      Head Circumference --      Peak Flow --      Pain Score 06/07/22 1241 4     Pain Loc --      Pain Edu? --      Excl. in [place]? --    No data found.  Updated Vital Signs BP 124/80 (BP Location: Left Arm)   Pulse (!) 55   Temp 97.9 F (36.6 C) (Oral)   Resp 18   LMP 07/28/2015   SpO2 96%   Physical Exam Vitals and nursing note reviewed.  Constitutional:      General: She is not in acute distress.    Appearance: Normal appearance. She is not ill-appearing.  HENT:     Head: Normocephalic and atraumatic.     Mouth/Throat:     Mouth: Mucous membranes are moist.     Pharynx: Oropharynx is clear.     Comments: Significant swelling to left mandibular area, left lower molar caries, gingival swelling noted.  Eyes:     Conjunctiva/sclera: Conjunctivae normal.  Cardiovascular:     Rate and Rhythm: Normal rate.  Pulmonary:     Effort: Pulmonary effort is normal.  Neurological:     Mental Status: She is alert.  Psychiatric:        Mood and Affect: Mood normal.        Behavior: Behavior normal.        Thought Content: Thought content normal.      UC Treatments /  Results  Labs (all labs ordered are listed, but only abnormal results are displayed) Labs Reviewed - No data to display  EKG   Radiology No results found.  Procedures Procedures (including critical care time)  Medications Ordered in UC Medications - No data to display  Initial Impression / Assessment and Plan / UC Course  I have reviewed the triage vital signs and the nursing notes.  Pertinent labs & imaging results that were available during my care of the patient were reviewed by  me and considered in my medical decision making (see chart for details).    Clindamycin prescribed to cover infection. Recommend follow up with dentistry or ED sooner if symptoms are not improved with oral antibiotics. Diflucan prescribed at patients request. Encouraged follow up with any concerns.   Final Clinical Impressions(s) / UC Diagnoses   Final diagnoses:  Dental abscess   Discharge Instructions   None    ED Prescriptions     Medication Sig Dispense Auth. Provider   clindamycin (CLEOCIN) 300 MG capsule Take 1 capsule (300 mg total) by mouth 3 (three) times daily for 7 days. 21 capsule Ewell Poe F, PA-C   fluconazole (DIFLUCAN) 150 MG tablet Take one tab PO today and repeat dose in 3 days if symptoms persist 2 tablet Francene Finders, PA-C      PDMP not reviewed this encounter.   Francene Finders, PA-C 06/07/22 1258

## 2022-06-15 ENCOUNTER — Encounter: Payer: Self-pay | Admitting: Physician Assistant

## 2022-06-16 NOTE — Telephone Encounter (Signed)
Scheduled for 06/17/2022 '@11'$ :30. tvt

## 2022-06-17 ENCOUNTER — Encounter: Payer: Self-pay | Admitting: Physician Assistant

## 2022-06-17 ENCOUNTER — Telehealth (INDEPENDENT_AMBULATORY_CARE_PROVIDER_SITE_OTHER): Payer: No Typology Code available for payment source | Admitting: Physician Assistant

## 2022-06-17 DIAGNOSIS — K047 Periapical abscess without sinus: Secondary | ICD-10-CM

## 2022-06-17 MED ORDER — TRAMADOL HCL 50 MG PO TABS: 50.0000 mg | ORAL_TABLET | Freq: Three times a day (TID) | ORAL | 0 refills | Status: DC | PRN

## 2022-06-17 MED ORDER — METRONIDAZOLE 500 MG PO TABS: 500.0000 mg | ORAL_TABLET | Freq: Three times a day (TID) | ORAL | 0 refills | Status: AC

## 2022-06-17 NOTE — Progress Notes (Signed)
..Virtual Visit via Video Note  I connected with Margaret Lawson on 06/17/22 at 11:30 AM EST by a video enabled telemedicine application and verified that I am speaking with the correct person using two identifiers.  Location: Patient: home Provider: clinic  .Marland KitchenParticipating in visit:  Patient: Margaret Lawson Provider: Iran Planas PA-C   I discussed the limitations of evaluation and management by telemedicine and the availability of in person appointments. The patient expressed understanding and agreed to proceed.  History of Present Illness: Pt is 41 yo obese female who calls in to follow up on dental abscess. She started having pain on 11/10 and then  went to ED on 11/12. She was treated with clindamycin TID for 7 days. She has finished abx.she got a lot better but was still having a lot of pain and abscess still seen left lower molar gum line.  Last night the abscess ruptured and it feels much better. She continues to have some pain when her teeth come together. No fever. She has had some chills. Using tramadol for pain but ran out and would like more.   .. 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/10/2015   Surgical menopause 04/23/2016   Cervical spondylosis without myelopathy 04/23/2016   Nipple discharge in female 08/03/2016   Anorectal skin tags 08/03/2016   Bipolar 2 disorder, major depressive episode ([place]) 11/13/2016   Frequent headaches 11/15/2016   Family  history of brain aneurysm 11/15/2016   Moderate persistent asthma without complication 80/09/4915   Functional diarrhea 12/03/2016   Fatigue 02/13/2018   Inattention 02/13/2018   Hot flashes 02/26/2018   Panic attacks 03/30/2018   Insomnia 03/30/2018   Grief reaction 08/28/2019   Weight gain 08/28/2019   Hot flashes due to surgical menopause 08/28/2019   Female climacteric state 08/28/2019   PTSD (post-traumatic stress disorder) 01/10/2020   Bipolar disorder ([place]) 01/10/2020   Borderline personality disorder ([place]) 01/10/2020   Tachycardia 02/17/2021   Nausea 02/17/2021   Chronic fatigue 02/17/2021   Excessive sweating 02/17/2021   Flushing 02/17/2021   Acute left-sided low back pain without sciatica 10/20/2021   Tobacco use disorder 07/02/2018   Severe episode of recurrent major depressive disorder, without psychotic features ([place]) 07/01/2018   Cannabis abuse 06/29/2018   Other stimulant dependence, 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   Nicotine dependence, unspecified, uncomplicated 91/50/5697   Cocaine abuse in remission ([place]) 07/08/2018   Cannabis dependence, uncomplicated ([place]) 94/80/1655   Alcohol dependence, uncomplicated ([place]) 37/48/2707   Left otitis media with effusion 11/19/2021   Hearing loss of left ear 11/19/2021   Abnormal exam of left ear 12/03/2021   Otalgia of left ear 12/05/2021   Arm mass, right 02/18/2022   Screening mammogram for breast cancer 02/18/2022   Resolved Ambulatory Problems    Diagnosis Date Noted   Asthma, chronic 12/27/2013   Perforation of left tympanic membrane 12/27/2013   Bipolar I disorder, most recent episode depressed ([place]) 10/11/2014   Ovarian cyst, right 07/15/2015   Amenorrhea 07/15/2015   Abnormal uterine bleeding (AUB) 09/10/2015  Post-operative state 09/10/2015   Acute tonsillitis 04/23/2016   Loose stools 08/03/2016   No energy 11/13/2016   Acute non-recurrent maxillary sinusitis 12/03/2016   Past Medical History:   Diagnosis Date   Anxiety    Bipolar 2 disorder (HCC)    Headache    Low iron    PCOS (polycystic ovarian syndrome)    Poor dentition    Restless leg syndrome    Right leg pain    Teeth clenching    Teeth grinding    Weakness       Observations/Objective: No acute distress No mouth swelling  Picture on mychart show left lower molar abscess per patient that   Assessment and Plan: Marland KitchenMarland KitchenDiagnoses and all orders for this visit:  Dental abscess -     metroNIDAZOLE (FLAGYL) 500 MG tablet; Take 1 tablet (500 mg total) by mouth 3 (three) times daily for 7 days. -     traMADol (ULTRAM) 50 MG tablet; Take 1 tablet (50 mg total) by mouth every 8 (eight) hours as needed for moderate pain.   Seems to be improving Continue warm compresses and salt water mouth washes Add metronidazole if infection symptoms come back but I would hold for now Tramadol for break through pain .Marland KitchenPDMP reviewed during this encounter. No concerns Needs to find a dentist for further treatment   Follow Up Instructions:    I discussed the assessment and treatment plan with the patient. The patient was provided an opportunity to ask questions and all were answered. The patient agreed with the plan and demonstrated an understanding of the instructions.   The patient was advised to call back or seek an in-person evaluation if the symptoms worsen or if the condition fails to improve as anticipated.    Iran Planas, PA-C

## 2022-06-23 ENCOUNTER — Ambulatory Visit: Payer: No Typology Code available for payment source | Admitting: Physician Assistant

## 2022-06-26 ENCOUNTER — Encounter: Payer: No Typology Code available for payment source | Admitting: Physician Assistant

## 2022-07-04 ENCOUNTER — Other Ambulatory Visit (HOSPITAL_COMMUNITY): Payer: Self-pay | Admitting: Physician Assistant

## 2022-07-04 DIAGNOSIS — F3181 Bipolar II disorder: Secondary | ICD-10-CM

## 2022-07-04 DIAGNOSIS — G4700 Insomnia, unspecified: Secondary | ICD-10-CM

## 2022-07-09 ENCOUNTER — Encounter: Payer: Self-pay | Admitting: Physician Assistant

## 2022-07-09 DIAGNOSIS — F3181 Bipolar II disorder: Secondary | ICD-10-CM

## 2022-07-09 DIAGNOSIS — G4700 Insomnia, unspecified: Secondary | ICD-10-CM

## 2022-07-10 MED ORDER — CARIPRAZINE HCL 1.5 MG PO CAPS: 1.5000 mg | ORAL_CAPSULE | Freq: Every day | ORAL | 1 refills | Status: DC

## 2022-07-10 MED ORDER — TRAZODONE HCL 50 MG PO TABS: 50.0000 mg | ORAL_TABLET | Freq: Every day | ORAL | 1 refills | Status: DC

## 2022-08-02 ENCOUNTER — Other Ambulatory Visit: Payer: Self-pay | Admitting: Physician Assistant

## 2022-08-07 ENCOUNTER — Encounter: Payer: Self-pay | Admitting: Physician Assistant

## 2022-08-07 ENCOUNTER — Ambulatory Visit (INDEPENDENT_AMBULATORY_CARE_PROVIDER_SITE_OTHER): Payer: No Typology Code available for payment source | Admitting: Physician Assistant

## 2022-08-07 VITALS — BP 138/78 | HR 59 | Ht 68.0 in | Wt 271.0 lb

## 2022-08-07 DIAGNOSIS — Z5181 Encounter for therapeutic drug level monitoring: Secondary | ICD-10-CM

## 2022-08-07 DIAGNOSIS — Z79890 Hormone replacement therapy (postmenopausal): Secondary | ICD-10-CM

## 2022-08-07 DIAGNOSIS — E876 Hypokalemia: Secondary | ICD-10-CM

## 2022-08-07 DIAGNOSIS — J4540 Moderate persistent asthma, uncomplicated: Secondary | ICD-10-CM | POA: Diagnosis not present

## 2022-08-07 DIAGNOSIS — Z1329 Encounter for screening for other suspected endocrine disorder: Secondary | ICD-10-CM

## 2022-08-07 DIAGNOSIS — E8940 Asymptomatic postprocedural ovarian failure: Secondary | ICD-10-CM

## 2022-08-07 DIAGNOSIS — F3181 Bipolar II disorder: Secondary | ICD-10-CM | POA: Diagnosis not present

## 2022-08-07 DIAGNOSIS — Z23 Encounter for immunization: Secondary | ICD-10-CM

## 2022-08-07 DIAGNOSIS — Z13220 Encounter for screening for lipoid disorders: Secondary | ICD-10-CM

## 2022-08-07 DIAGNOSIS — I10 Essential (primary) hypertension: Secondary | ICD-10-CM

## 2022-08-07 DIAGNOSIS — G4700 Insomnia, unspecified: Secondary | ICD-10-CM

## 2022-08-07 MED ORDER — ATENOLOL 50 MG PO TABS: 50.0000 mg | ORAL_TABLET | Freq: Two times a day (BID) | ORAL | 1 refills | Status: DC

## 2022-08-07 MED ORDER — ALBUTEROL SULFATE HFA 108 (90 BASE) MCG/ACT IN AERS: INHALATION_SPRAY | RESPIRATORY_TRACT | 3 refills | Status: DC

## 2022-08-07 MED ORDER — CARIPRAZINE HCL 3 MG PO CAPS: 3.0000 mg | ORAL_CAPSULE | Freq: Every day | ORAL | 0 refills | Status: DC

## 2022-08-07 MED ORDER — LAMOTRIGINE 100 MG PO TABS: 100.0000 mg | ORAL_TABLET | Freq: Every day | ORAL | 1 refills | Status: DC

## 2022-08-07 MED ORDER — POTASSIUM CHLORIDE CRYS ER 10 MEQ PO TBCR: EXTENDED_RELEASE_TABLET | ORAL | 1 refills | Status: DC

## 2022-08-07 MED ORDER — TRIAMTERENE-HCTZ 37.5-25 MG PO TABS: 1.0000 | ORAL_TABLET | Freq: Every day | ORAL | 1 refills | Status: DC

## 2022-08-07 MED ORDER — TRAZODONE HCL 50 MG PO TABS: 100.0000 mg | ORAL_TABLET | Freq: Every day | ORAL | 1 refills | Status: DC

## 2022-08-07 MED ORDER — ESTRADIOL 1 MG PO TABS: 1.0000 mg | ORAL_TABLET | Freq: Every day | ORAL | 1 refills | Status: DC

## 2022-08-07 NOTE — Progress Notes (Signed)
Established Patient Office Visit  Subjective   Patient ID: Margaret Lawson, female    DOB: April 25, 1981  Age: 42 y.o. MRN: [id number]  Chief Complaint  Patient presents with   Follow-up    HPI Pt is a 72 obese female with Bipolar 2, Asthma, insomnia, HTN who presents to the clinic for medication refills.   Request increase in vraylar. She feels much better and more stable mood but feels like her depression could be better. No SI/HC.   She recently got over URI and needs albuterol inhaler since using it more during URI.   Denies any CP, palpitations, headaches or dizziness. Taking medications as directed.   .. 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/10/2015   Surgical menopause 04/23/2016   Cervical spondylosis without myelopathy 04/23/2016   Nipple discharge in female 08/03/2016   Anorectal skin tags 08/03/2016   Bipolar 2 disorder, major depressive episode ([place]) 11/13/2016   Frequent headaches 11/15/2016   Family history of brain aneurysm 11/15/2016   Moderate persistent asthma without complication 67/20/9470   Functional diarrhea 12/03/2016   Fatigue 02/13/2018   Inattention 02/13/2018   Hot flashes 02/26/2018   Panic attacks 03/30/2018   Insomnia 03/30/2018   Grief reaction 08/28/2019   Weight gain 08/28/2019   Hot flashes due to surgical menopause 08/28/2019   Female climacteric state 08/28/2019   PTSD (post-traumatic stress disorder)  01/10/2020   Bipolar disorder ([place]) 01/10/2020   Borderline personality disorder ([place]) 01/10/2020   Tachycardia 02/17/2021   Nausea 02/17/2021   Chronic fatigue 02/17/2021   Excessive sweating 02/17/2021   Flushing 02/17/2021   Acute left-sided low back pain without sciatica 10/20/2021   Tobacco use disorder 07/02/2018   Severe episode of recurrent major depressive disorder, without psychotic features ([place]) 07/01/2018   Cannabis abuse 06/29/2018   Other stimulant dependence, 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/2018   Nicotine dependence, unspecified, uncomplicated 96/28/3662   Cocaine abuse in remission ([place]) 07/08/2018   Cannabis dependence, uncomplicated ([place]) 94/76/5465   Alcohol dependence, uncomplicated ([place]) 03/54/6568   Left otitis media with effusion 11/19/2021   Hearing loss of left ear 11/19/2021   Abnormal exam of left ear 12/03/2021   Otalgia of left ear 12/05/2021   Arm mass, right 02/18/2022   Screening mammogram for breast cancer 02/18/2022   Resolved Ambulatory Problems    Diagnosis Date Noted   Asthma, chronic 12/27/2013   Perforation of left tympanic membrane 12/27/2013   Bipolar I disorder, most recent episode depressed ([place]) 10/11/2014   Ovarian cyst, right 07/15/2015   Amenorrhea 07/15/2015   Abnormal uterine bleeding (AUB) 09/10/2015   Post-operative state 09/10/2015   Acute tonsillitis 04/23/2016   Loose stools 08/03/2016   No energy 11/13/2016   Acute non-recurrent maxillary sinusitis 12/03/2016   Past Medical History:  Diagnosis Date   Anxiety    Bipolar 2 disorder (HCC)    Headache    Low iron    PCOS (polycystic ovarian syndrome)    Poor dentition    Restless leg syndrome  Right leg pain    Teeth clenching    Teeth grinding    Weakness       ROS See HPI.    Objective:     BP 138/78   Pulse (!) 59   Ht '5\' 8"'$  (1.727 m)   Wt 271 lb (122.9 kg)   LMP 07/28/2015   SpO2 97%   BMI 41.21 kg/m  BP Readings from Last 3 Encounters:  08/07/22  138/78  06/07/22 124/80  03/27/22 132/85   Wt Readings from Last 3 Encounters:  08/07/22 271 lb (122.9 kg)  04/07/22 286 lb (129.7 kg)  03/27/22 286 lb (129.7 kg)    ..    08/07/2022    1:42 PM 04/22/2022    2:21 PM 03/27/2022    2:55 PM 11/18/2021    1:11 PM 10/10/2021    3:43 PM  Depression screen PHQ 2/9  Decreased Interest 1  1 0   Down, Depressed, Hopeless 1  2 0   PHQ - 2 Score 2  3 0   Altered sleeping 2  2    Tired, decreased energy 2  0    Change in appetite 1  2    Feeling bad or failure about yourself  1  1    Trouble concentrating 2  1    Moving slowly or fidgety/restless 0  1    Suicidal thoughts 0  0    PHQ-9 Score 10  10    Difficult doing work/chores Somewhat difficult  Somewhat difficult       Information is confidential and restricted. Go to Review Flowsheets to unlock data.   ..    08/07/2022    1:42 PM 04/22/2022    2:25 PM 03/27/2022    2:55 PM 10/10/2021    3:48 PM  GAD 7 : Generalized Anxiety Score  Nervous, Anxious, on Edge 1  2   Control/stop worrying 2  2   Worry too much - different things 2  2   Trouble relaxing 1  2   Restless 2  1   Easily annoyed or irritable 1  3   Afraid - awful might happen 1  2   Total GAD 7 Score 10  14   Anxiety Difficulty Somewhat difficult  Very difficult      Information is confidential and restricted. Go to Review Flowsheets to unlock data.      Physical Exam Constitutional:      Appearance: Normal appearance. She is obese.  HENT:     Head: Normocephalic.  Cardiovascular:     Rate and Rhythm: Normal rate and regular rhythm.  Pulmonary:     Effort: Pulmonary effort is normal.     Breath sounds: Normal breath sounds.  Neurological:     General: No focal deficit present.     Mental Status: She is alert and oriented to person, place, and time.  Psychiatric:        Mood and Affect: Mood normal.         Assessment & Plan:  Marland KitchenMarland KitchenNamiah was seen today for follow-up.  Diagnoses and all orders for this  visit:  Bipolar 2 disorder, major depressive episode ([place]) -     cariprazine (VRAYLAR) 3 MG capsule; Take 1 capsule (3 mg total) by mouth daily. -     lamoTRIgine (LAMICTAL) 100 MG tablet; Take 1 tablet (100 mg total) by mouth daily.  Needs flu shot -     Flu Vaccine QUAD 17moIM (Fluarix, Fluzone &  Alfiuria Quad PF)  Essential hypertension, benign -     atenolol (TENORMIN) 50 MG tablet; Take 1 tablet (50 mg total) by mouth 2 (two) times daily. -     triamterene-hydrochlorothiazide (MAXZIDE-25) 37.5-25 MG tablet; Take 1 tablet by mouth daily. -     COMPLETE METABOLIC PANEL WITH GFR  Encounter for monitoring primary estrogen replacement therapy -     estradiol (ESTRACE) 1 MG tablet; Take 1 tablet (1 mg total) by mouth daily.  Hypokalemia -     potassium chloride (KLOR-CON M) 10 MEQ tablet; To take one tablet with lasix as needed. -     COMPLETE METABOLIC PANEL WITH GFR  Insomnia, unspecified type -     traZODone (DESYREL) 50 MG tablet; Take 2 tablets (100 mg total) by mouth at bedtime.  Surgical menopause -     Estradiol -     CBC w/Diff/Platelet  Screening for hyperlipidemia -     Lipid Panel w/reflex Direct LDL  Thyroid disorder screening -     TSH  Moderate persistent asthma without complication -     albuterol (VENTOLIN HFA) 108 (90 Base) MCG/ACT inhaler; INHALE TWO PUFFS BY MOUTH EVERY 4 HOURS AS NEEDED FOR WHEEZING   Increased vraylar. Labs for screening and to check hormones Albuterol to use as needed    Return in about 6 months (around 02/05/2023).    Iran Planas, PA-C

## 2022-08-25 ENCOUNTER — Other Ambulatory Visit: Payer: No Typology Code available for payment source

## 2022-08-26 ENCOUNTER — Encounter: Payer: Self-pay | Admitting: Physician Assistant

## 2022-08-26 LAB — TSH: TSH: 3.71 mIU/L

## 2022-08-26 LAB — COMPLETE METABOLIC PANEL WITH GFR
AG Ratio: 1.3 (calc) (ref 1.0–2.5)
ALT: 25 U/L (ref 6–29)
AST: 13 U/L (ref 10–30)
Albumin: 3.6 g/dL (ref 3.6–5.1)
Alkaline phosphatase (APISO): 57 U/L (ref 31–125)
BUN: 13 mg/dL (ref 7–25)
CO2: 30 mmol/L (ref 20–32)
Calcium: 8.8 mg/dL (ref 8.6–10.2)
Chloride: 101 mmol/L (ref 98–110)
Creat: 0.82 mg/dL (ref 0.50–0.99)
Globulin: 2.7 g/dL (calc) (ref 1.9–3.7)
Glucose, Bld: 94 mg/dL (ref 65–99)
Potassium: 3.8 mmol/L (ref 3.5–5.3)
Sodium: 140 mmol/L (ref 135–146)
Total Bilirubin: 0.5 mg/dL (ref 0.2–1.2)
Total Protein: 6.3 g/dL (ref 6.1–8.1)
eGFR: 92 mL/min/{1.73_m2} (ref >=60)

## 2022-08-26 LAB — CBC WITH DIFFERENTIAL/PLATELET
Absolute Monocytes: 818 cells/uL (ref 200–950)
Basophils Absolute: 78 cells/uL (ref 0–200)
Basophils Relative: 0.7 %
Eosinophils Absolute: 224 cells/uL (ref 15–500)
Eosinophils Relative: 2 %
HCT: 41.1 % (ref 35.0–45.0)
Hemoglobin: 13.6 g/dL (ref 11.7–15.5)
Lymphs Abs: 4738 cells/uL — ABNORMAL HIGH (ref 850–3900)
MCH: 28.7 pg (ref 27.0–33.0)
MCHC: 33.1 g/dL (ref 32.0–36.0)
MCV: 86.7 fL (ref 80.0–100.0)
MPV: 11.3 fL (ref 7.5–12.5)
Monocytes Relative: 7.3 %
Neutro Abs: 5342 cells/uL (ref 1500–7800)
Neutrophils Relative %: 47.7 %
Platelets: 260 10*3/uL (ref 140–400)
RBC: 4.74 10*6/uL (ref 3.80–5.10)
RDW: 13.2 % (ref 11.0–15.0)
Total Lymphocyte: 42.3 %
WBC: 11.2 10*3/uL — ABNORMAL HIGH (ref 3.8–10.8)

## 2022-08-26 LAB — LIPID PANEL W/REFLEX DIRECT LDL
Cholesterol: 184 mg/dL (ref <200)
HDL: 49 mg/dL — ABNORMAL LOW (ref >=50)
LDL Cholesterol (Calc): 105 mg/dL (calc) — ABNORMAL HIGH
Non-HDL Cholesterol (Calc): 135 mg/dL (calc) — ABNORMAL HIGH (ref <130)
Total CHOL/HDL Ratio: 3.8 (calc) (ref <5.0)
Triglycerides: 186 mg/dL — ABNORMAL HIGH (ref <150)

## 2022-08-26 LAB — ESTRADIOL: Estradiol: 50 pg/mL

## 2022-08-26 NOTE — Progress Notes (Signed)
Margaret Lawson,   Estradiol 50 which is in normal range but on the low normal.  Thyroid normal.  Kidney, liver, glucose look great.  LDL looks pretty good TG just barely elevated  CV risk under 7.5 percent. Continue to work on diet and exercise.   Marland Kitchen.The 10-year ASCVD risk score (Arnett DK, et al., 2019) is: 4%   Values used to calculate the score:     Age: 43 years     Sex: Female     Is Non-Hispanic African American: No     Diabetic: No     Tobacco smoker: Yes     Systolic Blood Pressure: 469 mmHg     Is BP treated: Yes     HDL Cholesterol: 49 mg/dL     Total Cholesterol: 184 mg/dL

## 2022-09-18 ENCOUNTER — Encounter: Payer: Self-pay | Admitting: Physician Assistant

## 2022-09-18 ENCOUNTER — Telehealth (INDEPENDENT_AMBULATORY_CARE_PROVIDER_SITE_OTHER): Payer: No Typology Code available for payment source | Admitting: Physician Assistant

## 2022-09-18 DIAGNOSIS — F410 Panic disorder [episodic paroxysmal anxiety] without agoraphobia: Secondary | ICD-10-CM | POA: Diagnosis not present

## 2022-09-18 DIAGNOSIS — F411 Generalized anxiety disorder: Secondary | ICD-10-CM

## 2022-09-18 DIAGNOSIS — F3181 Bipolar II disorder: Secondary | ICD-10-CM

## 2022-09-18 MED ORDER — SERTRALINE HCL 25 MG PO TABS: 25.0000 mg | ORAL_TABLET | Freq: Every day | ORAL | 0 refills | Status: DC

## 2022-09-18 MED ORDER — CLONAZEPAM 0.5 MG PO TABS: 0.5000 mg | ORAL_TABLET | Freq: Two times a day (BID) | ORAL | 2 refills | Status: DC | PRN

## 2022-09-18 NOTE — Progress Notes (Signed)
..Virtual Visit via Video Note  I connected with Margaret Lawson on 09/18/22 at 11:30 AM EST by a video enabled telemedicine application and verified that I am speaking with the correct person using two identifiers.  Location: Patient: home Provider: clinic  .Marland KitchenParticipating in visit:  Patient: Margaret Lawson Provider: Iran Planas PA-C   I discussed the limitations of evaluation and management by telemedicine and the availability of in person appointments. The patient expressed understanding and agreed to proceed.  History of Present Illness: Pt is a 42 yo female who calls into the clinic to discuss anxiety. 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 She feels at time she is going to have panic attack. She has trouble focusing and completing task when anxiety gets too bad. She does not feel like current medications are helping anxiety. She loves them for depression. Denies any SI/HC. She has not had any medication changes in last 2 months.   .. 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/10/2015   Surgical menopause 04/23/2016   Cervical spondylosis without myelopathy 04/23/2016   Nipple discharge in female 08/03/2016   Anorectal skin tags 08/03/2016   Bipolar 2 disorder, major depressive episode ([place]) 11/13/2016   Frequent headaches 11/15/2016   Family history of brain aneurysm 11/15/2016   Moderate persistent  asthma without complication [id number]   Functional diarrhea 12/03/2016   Fatigue 02/13/2018   Inattention 02/13/2018   Hot flashes 02/26/2018   Panic attacks 03/30/2018   Insomnia 03/30/2018   Grief reaction 08/28/2019   Weight gain 08/28/2019   Hot flashes due to surgical menopause 08/28/2019   Female climacteric state 08/28/2019   PTSD (post-traumatic stress disorder) 01/10/2020   Bipolar disorder ([place]) 01/10/2020   Borderline personality disorder ([place]) 01/10/2020   Tachycardia 02/17/2021   Nausea 02/17/2021   Chronic fatigue 02/17/2021   Excessive sweating 02/17/2021   Flushing 02/17/2021   Acute left-sided low back pain without sciatica 10/20/2021   Tobacco use disorder 07/02/2018   Severe episode of recurrent major depressive disorder, without psychotic features ([place]) 07/01/2018   Cannabis abuse 06/29/2018   Other stimulant dependence, in remission ([place]) 07/08/2018   Nicotine dependence, unspecified, uncomplicated [id number]   Cocaine abuse in remission ([place]) 07/08/2018   Cannabis dependence, uncomplicated ([place]) [id number]   Alcohol dependence, uncomplicated ([place]) [id number]   Left otitis media with effusion 11/19/2021   Hearing loss of left ear 11/19/2021   Abnormal exam of left ear 12/03/2021   Otalgia of left ear 12/05/2021   Arm mass, right 02/18/2022   Screening mammogram for breast cancer 02/18/2022   Resolved Ambulatory Problems    Diagnosis Date Noted   Asthma, chronic 12/27/2013   Perforation of left tympanic membrane 12/27/2013   Bipolar I disorder, most recent episode depressed ([place]) 10/11/2014   Ovarian cyst, right 07/15/2015   Amenorrhea 07/15/2015   Abnormal uterine bleeding (AUB) 09/10/2015   Post-operative state 09/10/2015   Acute tonsillitis 04/23/2016   Loose stools 08/03/2016   No energy 11/13/2016  Acute non-recurrent maxillary sinusitis 12/03/2016   Past Medical History:  Diagnosis Date   Anxiety    Bipolar 2 disorder (HCC)    Headache     Low iron    PCOS (polycystic ovarian syndrome)    Poor dentition    Restless leg syndrome    Right leg pain    Teeth clenching    Teeth grinding    Weakness        Observations/Objective: No acute distress Normal mood and appearance Normal breathing     09/18/2022   10:46 AM 08/07/2022    1:42 PM 04/22/2022    2:21 PM 03/27/2022    2:55 PM 11/18/2021    1:11 PM  Depression screen PHQ 2/9  Decreased Interest '1 1  1 '$ 0  Down, Depressed, Hopeless 0 1  2 0  PHQ - 2 Score '1 2  3 '$ 0  Altered sleeping 0 2  2   Tired, decreased energy 1 2  0   Change in appetite '1 1  2   '$ Feeling bad or failure about yourself  '2 1  1   '$ Trouble concentrating '2 2  1   '$ Moving slowly or fidgety/restless 0 0  1   Suicidal thoughts 0 0  0   PHQ-9 Score '7 10  10   '$ Difficult doing work/chores  Somewhat difficult  Somewhat difficult      Information is confidential and restricted. Go to Review Flowsheets to unlock data.   ..    09/18/2022   10:48 AM 08/07/2022    1:42 PM 04/22/2022    2:25 PM 03/27/2022    2:55 PM  GAD 7 : Generalized Anxiety Score  Nervous, Anxious, on Edge '1 1  2  '$ Control/stop worrying '1 2  2  '$ Worry too much - different things '1 2  2  '$ Trouble relaxing '2 1  2  '$ Restless '2 2  1  '$ Easily annoyed or irritable '1 1  3  '$ Afraid - awful might happen '3 1  2  '$ Total GAD 7 Score '11 10  14  '$ Anxiety Difficulty Somewhat difficult Somewhat difficult  Very difficult     Information is confidential and restricted. Go to Review Flowsheets to unlock data.      Assessment and Plan: Marland KitchenMarland KitchenTeneil was seen today for anxiety.  Diagnoses and all orders for this visit:  Bipolar 2 disorder, major depressive episode (HCC)  Generalized anxiety disorder -     sertraline (ZOLOFT) 25 MG tablet; Take 1 tablet (25 mg total) by mouth daily.  Panic attacks -     clonazePAM (KLONOPIN) 0.5 MG tablet; Take 1 tablet (0.5 mg total) by mouth 2 (two) times daily as needed for anxiety.   Continue  Vraylar/Buspar/Lamictal Add zoloft and klonapin Only use klonapin as needed and sparingly Follow up in 6-8 weeks    Follow Up Instructions:    I discussed the assessment and treatment plan with the patient. The patient was provided an opportunity to ask questions and all were answered. The patient agreed with the plan and demonstrated an understanding of the instructions.   The patient was advised to call back or seek an in-person evaluation if the symptoms worsen or if the condition fails to improve as anticipated.   Iran Planas, PA-C

## 2022-09-29 ENCOUNTER — Encounter: Payer: Self-pay | Admitting: Physician Assistant

## 2022-10-30 ENCOUNTER — Other Ambulatory Visit: Payer: Self-pay | Admitting: Physician Assistant

## 2022-10-30 DIAGNOSIS — F3181 Bipolar II disorder: Secondary | ICD-10-CM

## 2022-11-17 ENCOUNTER — Other Ambulatory Visit: Payer: Self-pay | Admitting: Physician Assistant

## 2022-11-17 DIAGNOSIS — M797 Fibromyalgia: Secondary | ICD-10-CM

## 2022-11-19 NOTE — Telephone Encounter (Signed)
Message sent to patient via Mychart.

## 2022-12-20 ENCOUNTER — Other Ambulatory Visit: Payer: Self-pay | Admitting: Physician Assistant

## 2022-12-20 DIAGNOSIS — F411 Generalized anxiety disorder: Secondary | ICD-10-CM

## 2022-12-20 DIAGNOSIS — B009 Herpesviral infection, unspecified: Secondary | ICD-10-CM

## 2023-01-16 ENCOUNTER — Other Ambulatory Visit: Payer: Self-pay | Admitting: Physician Assistant

## 2023-01-16 DIAGNOSIS — B009 Herpesviral infection, unspecified: Secondary | ICD-10-CM

## 2023-01-24 ENCOUNTER — Other Ambulatory Visit: Payer: Self-pay | Admitting: Physician Assistant

## 2023-01-24 DIAGNOSIS — G4700 Insomnia, unspecified: Secondary | ICD-10-CM

## 2023-01-24 DIAGNOSIS — F3181 Bipolar II disorder: Secondary | ICD-10-CM

## 2023-01-25 ENCOUNTER — Ambulatory Visit: Payer: No Typology Code available for payment source | Admitting: Physician Assistant

## 2023-01-25 ENCOUNTER — Encounter: Payer: Self-pay | Admitting: Physician Assistant

## 2023-01-25 VITALS — BP 110/54 | HR 64 | Ht 68.0 in | Wt 256.0 lb

## 2023-01-25 DIAGNOSIS — K219 Gastro-esophageal reflux disease without esophagitis: Secondary | ICD-10-CM

## 2023-01-25 DIAGNOSIS — H65492 Other chronic nonsuppurative otitis media, left ear: Secondary | ICD-10-CM | POA: Diagnosis not present

## 2023-01-25 DIAGNOSIS — F410 Panic disorder [episodic paroxysmal anxiety] without agoraphobia: Secondary | ICD-10-CM

## 2023-01-25 DIAGNOSIS — Z79890 Hormone replacement therapy (postmenopausal): Secondary | ICD-10-CM

## 2023-01-25 DIAGNOSIS — H9203 Otalgia, bilateral: Secondary | ICD-10-CM

## 2023-01-25 DIAGNOSIS — F3181 Bipolar II disorder: Secondary | ICD-10-CM

## 2023-01-25 DIAGNOSIS — G4700 Insomnia, unspecified: Secondary | ICD-10-CM | POA: Diagnosis not present

## 2023-01-25 DIAGNOSIS — H9012 Conductive hearing loss, unilateral, left ear, with unrestricted hearing on the contralateral side: Secondary | ICD-10-CM

## 2023-01-25 DIAGNOSIS — R232 Flushing: Secondary | ICD-10-CM

## 2023-01-25 DIAGNOSIS — I10 Essential (primary) hypertension: Secondary | ICD-10-CM

## 2023-01-25 DIAGNOSIS — F411 Generalized anxiety disorder: Secondary | ICD-10-CM

## 2023-01-25 DIAGNOSIS — Z5181 Encounter for therapeutic drug level monitoring: Secondary | ICD-10-CM

## 2023-01-25 LAB — CBC WITH DIFFERENTIAL/PLATELET
MCH: 28.7 pg (ref 27.0–33.0)
RBC: 5.08 10*6/uL (ref 3.80–5.10)

## 2023-01-25 MED ORDER — ESTRADIOL 1 MG PO TABS
1.0000 mg | ORAL_TABLET | Freq: Every day | ORAL | 1 refills | Status: DC
Start: 2023-01-25 — End: 2023-07-30

## 2023-01-25 MED ORDER — ATENOLOL 50 MG PO TABS: 50.0000 mg | ORAL_TABLET | Freq: Two times a day (BID) | ORAL | 3 refills | Status: DC

## 2023-01-25 MED ORDER — PANTOPRAZOLE SODIUM 40 MG PO TBEC: 40.0000 mg | DELAYED_RELEASE_TABLET | Freq: Every day | ORAL | 3 refills | Status: DC

## 2023-01-25 MED ORDER — CEFDINIR 300 MG PO CAPS: 300.0000 mg | ORAL_CAPSULE | Freq: Two times a day (BID) | ORAL | 0 refills | Status: DC

## 2023-01-25 MED ORDER — LAMOTRIGINE 100 MG PO TABS
100.0000 mg | ORAL_TABLET | Freq: Every day | ORAL | 3 refills | Status: DC
Start: 2023-01-25 — End: 2024-02-21

## 2023-01-25 MED ORDER — SERTRALINE HCL 25 MG PO TABS
25.0000 mg | ORAL_TABLET | Freq: Every day | ORAL | 3 refills | Status: DC
Start: 2023-01-25 — End: 2024-03-16

## 2023-01-25 MED ORDER — CARIPRAZINE HCL 3 MG PO CAPS
3.0000 mg | ORAL_CAPSULE | Freq: Every day | ORAL | 3 refills | Status: DC
Start: 2023-01-25 — End: 2024-01-17

## 2023-01-25 MED ORDER — TRIAMTERENE-HCTZ 37.5-25 MG PO TABS
1.0000 | ORAL_TABLET | Freq: Every day | ORAL | 1 refills | Status: DC
Start: 2023-01-25 — End: 2023-07-30

## 2023-01-25 MED ORDER — METHYLPREDNISOLONE 4 MG PO TBPK: ORAL_TABLET | ORAL | 0 refills | Status: DC

## 2023-01-25 MED ORDER — TRAZODONE HCL 50 MG PO TABS
100.0000 mg | ORAL_TABLET | Freq: Every day | ORAL | 1 refills | Status: DC
Start: 2023-01-25 — End: 2023-08-18

## 2023-01-25 NOTE — Patient Instructions (Signed)
Referral to ENT Start omnicef and prednisone Get labs

## 2023-01-25 NOTE — Progress Notes (Signed)
Acute Office Visit  Subjective:     Patient ID: Margaret Lawson, female    DOB: 03-Jul-1981, 42 y.o.   MRN: [id number]  Chief Complaint  Patient presents with  . Ear Pain  . Ear Drainage    HPI Patient is in today for *** Ear pain Saturday  .Marland Kitchen Active Ambulatory Problems    Diagnosis Date Noted  . History of nephrolithiasis 12/27/2013  . Essential hypertension, benign 12/27/2013  . Fibromyalgia 12/27/2013  . Muscle spasm 12/27/2013  . GERD (gastroesophageal reflux disease) 12/27/2013  . Leukocytosis 12/27/2013  . Hyperlipidemia 01/03/2014  . Other migraine without status migrainosus, not intractable 03/07/2015  . 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 History of cholecystectomy 09/11/2010  . Generalized anxiety disorder 09/10/2015  . Hypokalemia 09/27/2015  . 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 Surgical menopause 04/23/2016  . Cervical spondylosis without myelopathy 04/23/2016  . Nipple discharge in female 08/03/2016  . Anorectal skin tags 08/03/2016  . Bipolar 2 disorder, major depressive episode (HCC) 11/13/2016  . Frequent headaches 11/15/2016  . Family history of brain aneurysm 11/15/2016  . Moderate persistent asthma without complication 12/03/2016  . Functional diarrhea 12/03/2016  . Fatigue 02/13/2018  . Inattention 02/13/2018  . Hot flashes 02/26/2018  . Panic attacks 03/30/2018  . Insomnia 03/30/2018  . Grief reaction 08/28/2019  . Weight gain 08/28/2019  . Hot flashes due to surgical menopause 08/28/2019  . Female climacteric state 08/28/2019  . PTSD (post-traumatic stress disorder) 01/10/2020  . Bipolar disorder (HCC) 01/10/2020  . Borderline personality disorder (HCC) 01/10/2020  . Tachycardia 02/17/2021  . Nausea 02/17/2021  . Chronic fatigue 02/17/2021  . Excessive sweating 02/17/2021  . Flushing 02/17/2021  . Acute left-sided low back pain without sciatica 10/20/2021  . Tobacco use disorder 07/02/2018  . Severe  episode of recurrent major depressive disorder, without psychotic features (HCC) 07/01/2018  . Cannabis abuse 06/29/2018  . Other stimulant dependence, in remission (HCC) 07/08/2018  . Nicotine dependence, unspecified, uncomplicated 07/08/2018  . Cocaine abuse in remission (HCC) 07/08/2018  . Cannabis dependence, uncomplicated (HCC) 07/08/2018  . Alcohol dependence, uncomplicated (HCC) 07/08/2018  . Left otitis media with effusion 11/19/2021  . Hearing loss of left ear 11/19/2021  . Abnormal exam of left ear 12/03/2021  . Otalgia of left ear 12/05/2021  . Arm mass, right 02/18/2022  . Screening mammogram for breast cancer 02/18/2022   Resolved Ambulatory Problems    Diagnosis Date Noted  . Asthma, chronic 12/27/2013  . Perforation of left tympanic membrane 12/27/2013  . Bipolar I disorder, most recent episode depressed (HCC) 10/11/2014  . Ovarian cyst, right 07/15/2015  . Amenorrhea 07/15/2015  . Abnormal uterine bleeding (AUB) 09/10/2015  . Post-operative state 09/10/2015  . Acute tonsillitis 04/23/2016  . Loose stools 08/03/2016  . No energy 11/13/2016  . Acute non-recurrent maxillary sinusitis 12/03/2016   Past Medical History:  Diagnosis Date  . Anxiety   . Bipolar 2 disorder (HCC)   . Headache   . Low iron   . PCOS (polycystic ovarian syndrome)   . Poor dentition   . Restless leg syndrome   . Right leg pain   . Teeth clenching   . Teeth grinding   . Weakness      ROS  See HPI.     Objective:    BP (!) 110/54 (BP Location: Left Arm, Patient Position: Sitting, Cuff Size: Large)   Pulse 64   Ht 5\' 8"  (1.727 m)  Wt 256 lb (116.1 kg)   LMP 07/28/2015   SpO2 96%   BMI 38.92 kg/m  BP Readings from Last 3 Encounters:  01/25/23 (!) 110/54  08/07/22 138/78  06/07/22 124/80   Wt Readings from Last 3 Encounters:  01/25/23 256 lb (116.1 kg)  08/07/22 271 lb (122.9 kg)  04/07/22 286 lb (129.7 kg)   .Marland KitchenNo results found.   Physical Exam         Assessment & Plan:    No orders of the defined types were placed in this encounter.   No follow-ups on file.  Tandy Gaw, PA-C

## 2023-01-26 ENCOUNTER — Encounter: Payer: Self-pay | Admitting: Physician Assistant

## 2023-01-26 LAB — COMPLETE METABOLIC PANEL WITH GFR
Albumin: 4.3 g/dL (ref 3.6–5.1)
Alkaline phosphatase (APISO): 69 U/L (ref 31–125)
Calcium: 10 mg/dL (ref 8.6–10.2)
Potassium: 4.3 mmol/L (ref 3.5–5.3)
Total Protein: 7.1 g/dL (ref 6.1–8.1)

## 2023-01-26 LAB — CBC WITH DIFFERENTIAL/PLATELET
Basophils Absolute: 79 cells/uL (ref 0–200)
Eosinophils Absolute: 124 cells/uL (ref 15–500)

## 2023-01-26 MED ORDER — FLUCONAZOLE 150 MG PO TABS: ORAL_TABLET | ORAL | 0 refills | Status: DC

## 2023-01-28 LAB — CP TESTOSTERONE, BIO-FEMALE/CHILDREN
Albumin: 4.3 g/dL (ref 3.6–5.1)
Sex Hormone Binding: 33 nmol/L (ref 17–124)
TESTOSTERONE, BIOAVAILABLE: 3 ng/dL (ref 0.5–8.5)
Testosterone, Free: 1.5 pg/mL (ref 0.2–5.0)
Testosterone, Total, LC-MS-MS: 13 ng/dL (ref 2–45)

## 2023-01-28 LAB — CBC WITH DIFFERENTIAL/PLATELET
Absolute Monocytes: 497 {cells}/uL (ref 200–950)
Basophils Relative: 0.7 %
Eosinophils Relative: 1.1 %
HCT: 43.9 % (ref 35.0–45.0)
Hemoglobin: 14.6 g/dL (ref 11.7–15.5)
Lymphs Abs: 4181 {cells}/uL — ABNORMAL HIGH (ref 850–3900)
MCHC: 33.3 g/dL (ref 32.0–36.0)
MCV: 86.4 fL (ref 80.0–100.0)
MPV: 11.7 fL (ref 7.5–12.5)
Monocytes Relative: 4.4 %
Neutro Abs: 6418 {cells}/uL (ref 1500–7800)
Neutrophils Relative %: 56.8 %
Platelets: 284 10*3/uL (ref 140–400)
RDW: 13.4 % (ref 11.0–15.0)
Total Lymphocyte: 37 %
WBC: 11.3 10*3/uL — ABNORMAL HIGH (ref 3.8–10.8)

## 2023-01-28 LAB — COMPLETE METABOLIC PANEL WITHOUT GFR
Creat: 0.8 mg/dL (ref 0.50–0.99)
Globulin: 2.8 g/dL (ref 1.9–3.7)
Glucose, Bld: 95 mg/dL (ref 65–99)
Sodium: 138 mmol/L (ref 135–146)
Total Bilirubin: 0.8 mg/dL (ref 0.2–1.2)
eGFR: 94 mL/min/{1.73_m2} (ref >=60)

## 2023-01-28 LAB — COMPLETE METABOLIC PANEL WITH GFR
AG Ratio: 1.5 (calc) (ref 1.0–2.5)
ALT: 14 U/L (ref 6–29)
AST: 14 U/L (ref 10–30)
BUN: 12 mg/dL (ref 7–25)
CO2: 28 mmol/L (ref 20–32)
Chloride: 100 mmol/L (ref 98–110)

## 2023-01-28 LAB — TSH: TSH: 1.43 mIU/L

## 2023-01-28 LAB — ESTRADIOL: Estradiol: 50 pg/mL

## 2023-02-02 NOTE — Progress Notes (Signed)
Margaret Lawson,   Testosterone in normal range for female.  Estradiol stable from 5 months ago. We could increase estrogen a little and see if higher dose improves symptoms. I would not want to exceed goal of 100 where you are at 50.  Thyroid looks great.

## 2023-02-10 ENCOUNTER — Other Ambulatory Visit: Payer: Self-pay | Admitting: Physician Assistant

## 2023-02-10 DIAGNOSIS — F410 Panic disorder [episodic paroxysmal anxiety] without agoraphobia: Secondary | ICD-10-CM

## 2023-02-15 ENCOUNTER — Other Ambulatory Visit: Payer: Self-pay | Admitting: Physician Assistant

## 2023-02-15 DIAGNOSIS — Z1231 Encounter for screening mammogram for malignant neoplasm of breast: Secondary | ICD-10-CM

## 2023-02-16 ENCOUNTER — Encounter: Payer: Self-pay | Admitting: Physician Assistant

## 2023-02-16 NOTE — Telephone Encounter (Signed)
Last fill 09/18/22 with 2 refills. Last visit 01/25/23

## 2023-02-19 ENCOUNTER — Other Ambulatory Visit: Payer: Self-pay | Admitting: Physician Assistant

## 2023-02-19 ENCOUNTER — Other Ambulatory Visit (HOSPITAL_COMMUNITY): Payer: Self-pay | Admitting: Physician Assistant

## 2023-02-19 DIAGNOSIS — F410 Panic disorder [episodic paroxysmal anxiety] without agoraphobia: Secondary | ICD-10-CM

## 2023-02-19 DIAGNOSIS — F4310 Post-traumatic stress disorder, unspecified: Secondary | ICD-10-CM

## 2023-02-19 DIAGNOSIS — F411 Generalized anxiety disorder: Secondary | ICD-10-CM

## 2023-02-19 DIAGNOSIS — B009 Herpesviral infection, unspecified: Secondary | ICD-10-CM

## 2023-02-26 ENCOUNTER — Other Ambulatory Visit (HOSPITAL_COMMUNITY): Payer: Self-pay | Admitting: Psychiatry

## 2023-02-26 DIAGNOSIS — F410 Panic disorder [episodic paroxysmal anxiety] without agoraphobia: Secondary | ICD-10-CM

## 2023-02-26 DIAGNOSIS — F411 Generalized anxiety disorder: Secondary | ICD-10-CM

## 2023-02-26 DIAGNOSIS — F4310 Post-traumatic stress disorder, unspecified: Secondary | ICD-10-CM

## 2023-03-01 ENCOUNTER — Other Ambulatory Visit (HOSPITAL_COMMUNITY): Payer: Self-pay | Admitting: Physician Assistant

## 2023-03-01 ENCOUNTER — Encounter: Payer: Self-pay | Admitting: Physician Assistant

## 2023-03-01 DIAGNOSIS — F4310 Post-traumatic stress disorder, unspecified: Secondary | ICD-10-CM

## 2023-03-01 DIAGNOSIS — F410 Panic disorder [episodic paroxysmal anxiety] without agoraphobia: Secondary | ICD-10-CM

## 2023-03-01 DIAGNOSIS — F411 Generalized anxiety disorder: Secondary | ICD-10-CM

## 2023-03-02 MED ORDER — BUSPIRONE HCL 15 MG PO TABS: 15.0000 mg | ORAL_TABLET | Freq: Three times a day (TID) | ORAL | 2 refills | Status: DC

## 2023-03-04 ENCOUNTER — Other Ambulatory Visit: Payer: Self-pay | Admitting: Physician Assistant

## 2023-03-04 DIAGNOSIS — E876 Hypokalemia: Secondary | ICD-10-CM

## 2023-03-04 DIAGNOSIS — M797 Fibromyalgia: Secondary | ICD-10-CM

## 2023-03-30 ENCOUNTER — Other Ambulatory Visit: Payer: Self-pay | Admitting: Physician Assistant

## 2023-03-30 DIAGNOSIS — B009 Herpesviral infection, unspecified: Secondary | ICD-10-CM

## 2023-03-31 ENCOUNTER — Other Ambulatory Visit: Payer: Self-pay | Admitting: Physician Assistant

## 2023-03-31 DIAGNOSIS — F410 Panic disorder [episodic paroxysmal anxiety] without agoraphobia: Secondary | ICD-10-CM

## 2023-04-28 ENCOUNTER — Other Ambulatory Visit: Payer: Self-pay | Admitting: Physician Assistant

## 2023-04-28 DIAGNOSIS — B009 Herpesviral infection, unspecified: Secondary | ICD-10-CM

## 2023-05-06 ENCOUNTER — Other Ambulatory Visit: Payer: Self-pay | Admitting: Physician Assistant

## 2023-05-06 DIAGNOSIS — F410 Panic disorder [episodic paroxysmal anxiety] without agoraphobia: Secondary | ICD-10-CM

## 2023-05-26 ENCOUNTER — Other Ambulatory Visit: Payer: Self-pay | Admitting: Family Medicine

## 2023-05-26 DIAGNOSIS — F410 Panic disorder [episodic paroxysmal anxiety] without agoraphobia: Secondary | ICD-10-CM

## 2023-05-26 DIAGNOSIS — F4310 Post-traumatic stress disorder, unspecified: Secondary | ICD-10-CM

## 2023-05-26 DIAGNOSIS — F411 Generalized anxiety disorder: Secondary | ICD-10-CM

## 2023-05-30 ENCOUNTER — Other Ambulatory Visit: Payer: Self-pay | Admitting: Physician Assistant

## 2023-05-30 DIAGNOSIS — B009 Herpesviral infection, unspecified: Secondary | ICD-10-CM

## 2023-05-30 DIAGNOSIS — M797 Fibromyalgia: Secondary | ICD-10-CM

## 2023-07-06 ENCOUNTER — Ambulatory Visit: Payer: No Typology Code available for payment source | Admitting: Family Medicine

## 2023-07-13 ENCOUNTER — Other Ambulatory Visit: Payer: Self-pay | Admitting: Physician Assistant

## 2023-07-13 ENCOUNTER — Other Ambulatory Visit: Payer: Self-pay | Admitting: Family Medicine

## 2023-07-13 DIAGNOSIS — F411 Generalized anxiety disorder: Secondary | ICD-10-CM

## 2023-07-13 DIAGNOSIS — F410 Panic disorder [episodic paroxysmal anxiety] without agoraphobia: Secondary | ICD-10-CM

## 2023-07-13 DIAGNOSIS — F4310 Post-traumatic stress disorder, unspecified: Secondary | ICD-10-CM

## 2023-07-13 DIAGNOSIS — B009 Herpesviral infection, unspecified: Secondary | ICD-10-CM

## 2023-07-27 ENCOUNTER — Other Ambulatory Visit: Payer: Self-pay | Admitting: Physician Assistant

## 2023-07-27 DIAGNOSIS — I10 Essential (primary) hypertension: Secondary | ICD-10-CM

## 2023-07-27 DIAGNOSIS — Z5181 Encounter for therapeutic drug level monitoring: Secondary | ICD-10-CM

## 2023-08-15 ENCOUNTER — Other Ambulatory Visit: Payer: Self-pay | Admitting: Physician Assistant

## 2023-08-15 DIAGNOSIS — G4700 Insomnia, unspecified: Secondary | ICD-10-CM

## 2023-08-15 DIAGNOSIS — B009 Herpesviral infection, unspecified: Secondary | ICD-10-CM

## 2023-08-17 ENCOUNTER — Other Ambulatory Visit: Payer: Self-pay | Admitting: Physician Assistant

## 2023-08-17 DIAGNOSIS — J4540 Moderate persistent asthma, uncomplicated: Secondary | ICD-10-CM

## 2023-08-20 ENCOUNTER — Other Ambulatory Visit: Payer: Self-pay | Admitting: Physician Assistant

## 2023-08-20 DIAGNOSIS — F411 Generalized anxiety disorder: Secondary | ICD-10-CM

## 2023-08-20 DIAGNOSIS — F410 Panic disorder [episodic paroxysmal anxiety] without agoraphobia: Secondary | ICD-10-CM

## 2023-08-20 DIAGNOSIS — F4310 Post-traumatic stress disorder, unspecified: Secondary | ICD-10-CM

## 2023-08-22 ENCOUNTER — Other Ambulatory Visit: Payer: Self-pay | Admitting: Physician Assistant

## 2023-08-22 DIAGNOSIS — M797 Fibromyalgia: Secondary | ICD-10-CM

## 2023-09-04 ENCOUNTER — Other Ambulatory Visit: Payer: Self-pay | Admitting: Physician Assistant

## 2023-09-04 DIAGNOSIS — Z5181 Encounter for therapeutic drug level monitoring: Secondary | ICD-10-CM

## 2023-09-04 DIAGNOSIS — I10 Essential (primary) hypertension: Secondary | ICD-10-CM

## 2023-09-07 ENCOUNTER — Ambulatory Visit: Payer: No Typology Code available for payment source | Admitting: Physician Assistant

## 2023-09-07 ENCOUNTER — Encounter: Payer: Self-pay | Admitting: Physician Assistant

## 2023-09-07 VITALS — BP 124/79 | HR 49 | Ht 68.0 in | Wt 260.0 lb

## 2023-09-07 DIAGNOSIS — I10 Essential (primary) hypertension: Secondary | ICD-10-CM | POA: Diagnosis not present

## 2023-09-07 DIAGNOSIS — B009 Herpesviral infection, unspecified: Secondary | ICD-10-CM

## 2023-09-07 DIAGNOSIS — G6289 Other specified polyneuropathies: Secondary | ICD-10-CM

## 2023-09-07 DIAGNOSIS — Z5181 Encounter for therapeutic drug level monitoring: Secondary | ICD-10-CM | POA: Diagnosis not present

## 2023-09-07 DIAGNOSIS — R200 Anesthesia of skin: Secondary | ICD-10-CM

## 2023-09-07 DIAGNOSIS — R1310 Dysphagia, unspecified: Secondary | ICD-10-CM

## 2023-09-07 DIAGNOSIS — G4700 Insomnia, unspecified: Secondary | ICD-10-CM

## 2023-09-07 DIAGNOSIS — F411 Generalized anxiety disorder: Secondary | ICD-10-CM

## 2023-09-07 DIAGNOSIS — Z79890 Hormone replacement therapy (postmenopausal): Secondary | ICD-10-CM

## 2023-09-07 DIAGNOSIS — F4310 Post-traumatic stress disorder, unspecified: Secondary | ICD-10-CM

## 2023-09-07 DIAGNOSIS — R202 Paresthesia of skin: Secondary | ICD-10-CM

## 2023-09-07 DIAGNOSIS — F410 Panic disorder [episodic paroxysmal anxiety] without agoraphobia: Secondary | ICD-10-CM

## 2023-09-07 MED ORDER — VALACYCLOVIR HCL 500 MG PO TABS
500.0000 mg | ORAL_TABLET | Freq: Every day | ORAL | 1 refills | Status: DC
Start: 2023-09-07 — End: 2024-03-16

## 2023-09-07 MED ORDER — TRIAMTERENE-HCTZ 37.5-25 MG PO TABS: 1.0000 | ORAL_TABLET | Freq: Every day | ORAL | 1 refills | Status: DC

## 2023-09-07 MED ORDER — ESTRADIOL 1 MG PO TABS: 1.0000 mg | ORAL_TABLET | Freq: Every day | ORAL | 1 refills | Status: DC

## 2023-09-07 MED ORDER — BUSPIRONE HCL 15 MG PO TABS: 15.0000 mg | ORAL_TABLET | Freq: Three times a day (TID) | ORAL | 1 refills | Status: DC

## 2023-09-07 MED ORDER — TRAZODONE HCL 50 MG PO TABS: 100.0000 mg | ORAL_TABLET | Freq: Every day | ORAL | 1 refills | Status: DC

## 2023-09-07 NOTE — Patient Instructions (Signed)
Referral for endoscopy Order EMG Get labs today

## 2023-09-07 NOTE — Progress Notes (Unsigned)
Established Patient Office Visit  Subjective   Patient ID: Margaret Lawson, female    DOB: 07-Jul-1981  Age: 43 y.o. MRN: [id number]  Chief Complaint  Patient presents with   Medical Management of Chronic Issues    Neuropathy in both arms, left arm nubness and tingleing  and both feet  and swallowing issues onset for 1 mo     HPI Pt is a 43 yo obese female who presents to the clinic to discuss ongoing problems and concerns.   Pt has had neuropathy in feet since in her 42s. She does feel like it is getting worse. She has some intermittent back pain but no radiation of pain into legs. Her arm numbness and tingling is getting worse. Worse in left pinky and ring finger than anywhere else. She denies any trouble walking. She has not had any loss of hand strength. She has failed gabapentin and lyrica.     Pt has had problems swallowing for years but has become more frequent in the last few months. She feels like food gets stuck in center of chest in esophagus. She denies any choking. It does not happen with water but only solid food. It always happens with food but can happen any meal. No vomiting. She is taking protonix daily. Denies any reflux.   Pt needs refills on medications.   .. 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/10/2015   Surgical menopause 04/23/2016   Cervical spondylosis without myelopathy 04/23/2016   Nipple discharge in female 08/03/2016    Anorectal skin tags 08/03/2016   Bipolar 2 disorder, major depressive episode (HCC) 11/13/2016   Frequent headaches 11/15/2016   Family history of brain aneurysm 11/15/2016   Moderate persistent asthma without complication 12/03/2016   Functional diarrhea 12/03/2016   Fatigue 02/13/2018   Inattention 02/13/2018   Hot flashes 02/26/2018   Panic attacks 03/30/2018   Insomnia 03/30/2018   Grief reaction 08/28/2019   Weight gain 08/28/2019   Hot flashes due to surgical menopause 08/28/2019   Female climacteric state 08/28/2019   PTSD (post-traumatic stress disorder) 01/10/2020   Bipolar disorder (HCC) 01/10/2020   Borderline personality disorder (HCC) 01/10/2020   Tachycardia 02/17/2021   Nausea 02/17/2021   Chronic fatigue 02/17/2021   Excessive sweating 02/17/2021   Flushing 02/17/2021   Acute left-sided low back pain without sciatica 10/20/2021   Tobacco use disorder 07/02/2018   Severe episode of recurrent major depressive disorder, without psychotic features (HCC) 07/01/2018   Cannabis abuse 06/29/2018   Other stimulant dependence, in remission (HCC) 07/08/2018   Nicotine dependence, unspecified, uncomplicated 07/08/2018   Cocaine abuse in remission (HCC) 07/08/2018   Cannabis dependence, uncomplicated (HCC) 07/08/2018   Alcohol dependence, uncomplicated (HCC) 07/08/2018   Left otitis media with effusion 11/19/2021   Hearing loss of left ear 11/19/2021   Abnormal exam of left ear 12/03/2021   Otalgia of left ear 12/05/2021   Arm mass, right 02/18/2022   Screening mammogram for breast cancer 02/18/2022   Dysphagia 09/08/2023   Encounter for monitoring  primary estrogen replacement therapy 09/08/2023   Numbness and tingling of both feet 09/08/2023   Numbness and tingling in both hands 09/08/2023   Resolved Ambulatory Problems    Diagnosis Date Noted   Asthma, chronic 12/27/2013   Perforation of left tympanic membrane 12/27/2013   Bipolar I disorder, most recent episode  depressed (HCC) 10/11/2014   Ovarian cyst, right 07/15/2015   Amenorrhea 07/15/2015   Abnormal uterine bleeding (AUB) 09/10/2015   Post-operative state 09/10/2015   Acute tonsillitis 04/23/2016   Loose stools 08/03/2016   No energy 11/13/2016   Acute non-recurrent maxillary sinusitis 12/03/2016   Past Medical History:  Diagnosis Date   Anxiety    Bipolar 2 disorder (HCC)    Headache    Low iron    PCOS (polycystic ovarian syndrome)    Poor dentition    Restless leg syndrome    Right leg pain    Teeth clenching    Teeth grinding    Weakness      ROS See HPI.    Objective:     BP 124/79   Pulse (!) 49   Ht 5\' 8"  (1.727 m)   Wt 260 lb (117.9 kg)   LMP 07/28/2015   SpO2 99%   BMI 39.53 kg/m  BP Readings from Last 3 Encounters:  09/07/23 124/79  01/25/23 (!) 110/54  08/07/22 138/78   Wt Readings from Last 3 Encounters:  09/07/23 260 lb (117.9 kg)  01/25/23 256 lb (116.1 kg)  08/07/22 271 lb (122.9 kg)    ..    09/18/2022   10:48 AM 08/07/2022    1:42 PM 04/22/2022    2:25 PM 03/27/2022    2:55 PM  GAD 7 : Generalized Anxiety Score  Nervous, Anxious, on Edge 1 1  2   Control/stop worrying 1 2  2   Worry too much - different things 1 2  2   Trouble relaxing 2 1  2   Restless 2 2  1   Easily annoyed or irritable 1 1  3   Afraid - awful might happen 3 1  2   Total GAD 7 Score 11 10  14   Anxiety Difficulty Somewhat difficult Somewhat difficult  Very difficult     Information is confidential and restricted. Go to Review Flowsheets to unlock data.    ..    09/18/2022   10:46 AM 08/07/2022    1:42 PM 04/22/2022    2:21 PM 03/27/2022    2:55 PM 11/18/2021    1:11 PM  Depression screen PHQ 2/9  Decreased Interest 1 1  1  0  Down, Depressed, Hopeless 0 1  2 0  PHQ - 2 Score 1 2  3  0  Altered sleeping 0 2  2   Tired, decreased energy 1 2  0   Change in appetite 1 1  2    Feeling bad or failure about yourself  2 1  1    Trouble concentrating 2 2  1    Moving slowly or  fidgety/restless 0 0  1   Suicidal thoughts 0 0  0   PHQ-9 Score 7 10  10    Difficult doing work/chores  Somewhat difficult  Somewhat difficult      Information is confidential and restricted. Go to Review Flowsheets to unlock data.     Physical Exam Constitutional:      Appearance: Normal appearance. She is obese.  HENT:     Head: Normocephalic.     Nose: Nose normal. No congestion or rhinorrhea.  Mouth/Throat:     Mouth: Mucous membranes are moist.     Pharynx: No oropharyngeal exudate or posterior oropharyngeal erythema.  Eyes:     Conjunctiva/sclera: Conjunctivae normal.  Cardiovascular:     Rate and Rhythm: Normal rate and regular rhythm.  Pulmonary:     Effort: Pulmonary effort is normal.     Breath sounds: Normal breath sounds.  Musculoskeletal:     Cervical back: Normal range of motion and neck supple. No tenderness.     Right lower leg: No edema.     Left lower leg: No edema.     Comments: Upper and lower extremity strength 5/5.  Negative tinels, bilaterally. Phalens with tingling in pinky and ring finger of left hand.  Hand grip 5/5.   Lymphadenopathy:     Cervical: No cervical adenopathy.  Neurological:     General: No focal deficit present.     Mental Status: She is alert and oriented to person, place, and time.  Psychiatric:        Mood and Affect: Mood normal.       The 10-year ASCVD risk score (Arnett DK, et al., 2019) is: 4.6%    Assessment & Plan:  Marland KitchenMarland KitchenEvangelene was seen today for medical management of chronic issues.  Diagnoses and all orders for this visit:  Other polyneuropathy -     Lipid panel -     CMP14+EGFR -     Sed Rate (ESR) -     ANA,IFA RA Diag Pnl w/rflx Tit/Patn -     C-reactive protein -     CBC w/Diff/Platelet -     Fe+TIBC+Fer -     TSH + free T4 -     B12 and Folate Panel -     VITAMIN D 25 Hydroxy (Vit-D Deficiency, Fractures) -     Magnesium -     Ambulatory referral to Neurology  Dysphagia, unspecified type -      Ambulatory referral to Gastroenterology  Encounter for monitoring primary estrogen replacement therapy -     estradiol (ESTRACE) 1 MG tablet; Take 1 tablet (1 mg total) by mouth daily. Need appointment.  Essential hypertension, benign -     triamterene-hydrochlorothiazide (MAXZIDE-25) 37.5-25 MG tablet; Take 1 tablet by mouth daily.  Generalized anxiety disorder -     busPIRone (BUSPAR) 15 MG tablet; Take 1 tablet (15 mg total) by mouth 3 (three) times daily.  PTSD (post-traumatic stress disorder) -     busPIRone (BUSPAR) 15 MG tablet; Take 1 tablet (15 mg total) by mouth 3 (three) times daily.  Panic attacks -     busPIRone (BUSPAR) 15 MG tablet; Take 1 tablet (15 mg total) by mouth 3 (three) times daily.  HSV infection -     valACYclovir (VALTREX) 500 MG tablet; Take 1 tablet (500 mg total) by mouth daily.  Insomnia, unspecified type -     traZODone (DESYREL) 50 MG tablet; Take 2 tablets (100 mg total) by mouth at bedtime.  Numbness and tingling in both hands -     Ambulatory referral to Neurology  Numbness and tingling of both feet -     Ambulatory referral to Neurology   Referral to GI for endoscopy consideration Stay on protonix daily  Labs for neuropathy EMG ordered   Trazodone refilled for sleep. Valtrex for HSV infection prevention.  Maxide for BP.  Estrace for HRT. Buspar for mood.    Tandy Gaw, PA-C

## 2023-09-08 ENCOUNTER — Encounter: Payer: Self-pay | Admitting: Physician Assistant

## 2023-09-08 DIAGNOSIS — R200 Anesthesia of skin: Secondary | ICD-10-CM | POA: Insufficient documentation

## 2023-09-08 DIAGNOSIS — Z79890 Hormone replacement therapy (postmenopausal): Secondary | ICD-10-CM | POA: Insufficient documentation

## 2023-09-08 DIAGNOSIS — R202 Paresthesia of skin: Secondary | ICD-10-CM | POA: Insufficient documentation

## 2023-09-08 DIAGNOSIS — R1310 Dysphagia, unspecified: Secondary | ICD-10-CM | POA: Insufficient documentation

## 2023-09-08 NOTE — Progress Notes (Signed)
Margaret Lawson,   Vitamin D low. Make sure taking another 2000units a day.   Thyroid looks good.   B12 looks great.   Magnesium normal.   CRP is elevated this is inflammatory marker.   Kidney, liver look good.   Glucose is up a litte, will you please add A1C.   HDL could be better, exercise can help.  LDL close to goal.   10 year cardiovascular risk is good.   Marland Kitchen.The 10-year ASCVD risk score (Arnett DK, et al., 2019) is: 4.6%   Values used to calculate the score:     Age: 43 years     Sex: Female     Is Non-Hispanic African American: No     Diabetic: No     Tobacco smoker: Yes     Systolic Blood Pressure: 124 mmHg     Is BP treated: Yes     HDL Cholesterol: 43 mg/dL     Total Cholesterol: 192 mg/dL

## 2023-09-11 ENCOUNTER — Other Ambulatory Visit: Payer: Self-pay | Admitting: Physician Assistant

## 2023-09-11 DIAGNOSIS — E876 Hypokalemia: Secondary | ICD-10-CM

## 2023-09-11 DIAGNOSIS — F410 Panic disorder [episodic paroxysmal anxiety] without agoraphobia: Secondary | ICD-10-CM

## 2023-09-14 ENCOUNTER — Encounter: Payer: Self-pay | Admitting: Physician Assistant

## 2023-09-15 LAB — CBC WITH DIFFERENTIAL/PLATELET
Basophils Absolute: 0.1 10*3/uL (ref 0.0–0.2)
Basos: 1 %
EOS (ABSOLUTE): 0.2 10*3/uL (ref 0.0–0.4)
Eos: 2 %
Hematocrit: 43.1 % (ref 34.0–46.6)
Hemoglobin: 14.2 g/dL (ref 11.1–15.9)
Immature Grans (Abs): 0 10*3/uL (ref 0.0–0.1)
Immature Granulocytes: 0 %
Lymphocytes Absolute: 3.7 10*3/uL — ABNORMAL HIGH (ref 0.7–3.1)
Lymphs: 38 %
MCH: 29 pg (ref 26.6–33.0)
MCHC: 32.9 g/dL (ref 31.5–35.7)
MCV: 88 fL (ref 79–97)
Monocytes Absolute: 0.6 10*3/uL (ref 0.1–0.9)
Monocytes: 6 %
Neutrophils Absolute: 5.1 10*3/uL (ref 1.4–7.0)
Neutrophils: 53 %
Platelets: 231 10*3/uL (ref 150–450)
RBC: 4.89 x10E6/uL (ref 3.77–5.28)
RDW: 13.4 % (ref 11.7–15.4)
WBC: 9.6 10*3/uL (ref 3.4–10.8)

## 2023-09-15 LAB — TSH+FREE T4
Free T4: 1.04 ng/dL (ref 0.82–1.77)
TSH: 3.09 u[IU]/mL (ref 0.450–4.500)

## 2023-09-15 LAB — IRON,TIBC AND FERRITIN PANEL
Ferritin: 136 ng/mL (ref 15–150)
Iron Saturation: 33 % (ref 15–55)
Iron: 97 ug/dL (ref 27–159)
Total Iron Binding Capacity: 291 ug/dL (ref 250–450)
UIBC: 194 ug/dL (ref 131–425)

## 2023-09-15 LAB — CMP14+EGFR
ALT: 14 [IU]/L (ref 0–32)
AST: 15 [IU]/L (ref 0–40)
Albumin: 3.9 g/dL (ref 3.9–4.9)
Alkaline Phosphatase: 79 [IU]/L (ref 44–121)
BUN/Creatinine Ratio: 12 (ref 9–23)
BUN: 9 mg/dL (ref 6–24)
Bilirubin Total: 0.5 mg/dL (ref 0.0–1.2)
CO2: 27 mmol/L (ref 20–29)
Calcium: 8.9 mg/dL (ref 8.7–10.2)
Chloride: 98 mmol/L (ref 96–106)
Creatinine, Ser: 0.73 mg/dL (ref 0.57–1.00)
Globulin, Total: 2.4 g/dL (ref 1.5–4.5)
Glucose: 103 mg/dL — ABNORMAL HIGH (ref 70–99)
Potassium: 3.6 mmol/L (ref 3.5–5.2)
Sodium: 138 mmol/L (ref 134–144)
Total Protein: 6.3 g/dL (ref 6.0–8.5)
eGFR: 105 mL/min/{1.73_m2} (ref >59)

## 2023-09-15 LAB — LIPID PANEL
Chol/HDL Ratio: 4.5 {ratio} — ABNORMAL HIGH (ref 0.0–4.4)
Cholesterol, Total: 192 mg/dL (ref 100–199)
HDL: 43 mg/dL (ref >39)
LDL Chol Calc (NIH): 111 mg/dL — ABNORMAL HIGH (ref 0–99)
Triglycerides: 220 mg/dL — ABNORMAL HIGH (ref 0–149)
VLDL Cholesterol Cal: 38 mg/dL (ref 5–40)

## 2023-09-15 LAB — B12 AND FOLATE PANEL
Folate: 10.9 ng/mL (ref >3.0)
Vitamin B-12: 759 pg/mL (ref 232–1245)

## 2023-09-15 LAB — ANA,IFA RA DIAG PNL W/RFLX TIT/PATN
ANA Titer 1: NEGATIVE
Cyclic Citrullin Peptide Ab: 5 U (ref 0–19)
Rheumatoid fact SerPl-aCnc: <10 [IU]/mL (ref <14.0)

## 2023-09-15 LAB — VITAMIN D 25 HYDROXY (VIT D DEFICIENCY, FRACTURES): Vit D, 25-Hydroxy: 26.9 ng/mL — ABNORMAL LOW (ref 30.0–100.0)

## 2023-09-15 LAB — C-REACTIVE PROTEIN: CRP: 23 mg/L — ABNORMAL HIGH (ref 0–10)

## 2023-09-15 LAB — MAGNESIUM: Magnesium: 2 mg/dL (ref 1.6–2.3)

## 2023-09-15 LAB — SEDIMENTATION RATE: Sed Rate: 30 mm/h (ref 0–32)

## 2023-09-15 NOTE — Progress Notes (Signed)
ANA negative. Unlikely of any auto-immune diseases.

## 2023-09-17 ENCOUNTER — Other Ambulatory Visit: Payer: Self-pay | Admitting: Family Medicine

## 2023-09-17 ENCOUNTER — Other Ambulatory Visit: Payer: Self-pay | Admitting: Physician Assistant

## 2023-09-17 DIAGNOSIS — E876 Hypokalemia: Secondary | ICD-10-CM

## 2023-09-17 DIAGNOSIS — F410 Panic disorder [episodic paroxysmal anxiety] without agoraphobia: Secondary | ICD-10-CM

## 2023-09-21 ENCOUNTER — Encounter: Payer: Self-pay | Admitting: Physician Assistant

## 2023-09-22 MED ORDER — CLONAZEPAM 0.5 MG PO TABS
0.5000 mg | ORAL_TABLET | Freq: Two times a day (BID) | ORAL | 0 refills | Status: DC | PRN
Start: 2023-09-22 — End: 2023-11-11

## 2023-09-22 MED ORDER — POTASSIUM CHLORIDE ER 10 MEQ PO TBCR: EXTENDED_RELEASE_TABLET | ORAL | 0 refills | Status: DC

## 2023-11-07 ENCOUNTER — Other Ambulatory Visit: Payer: Self-pay | Admitting: Physician Assistant

## 2023-11-07 DIAGNOSIS — F410 Panic disorder [episodic paroxysmal anxiety] without agoraphobia: Secondary | ICD-10-CM

## 2023-11-07 DIAGNOSIS — M797 Fibromyalgia: Secondary | ICD-10-CM

## 2023-11-10 NOTE — Telephone Encounter (Signed)
 Last OV: 09/07/23 Next OV: no appt scheduled Last RF: 09/22/23  Unable to send the other medication to the pharmacy, error msg received -   "Selective sign cannot be used for medications in a Telephone/Refill encounter".

## 2024-01-07 ENCOUNTER — Ambulatory Visit: Admitting: Physician Assistant

## 2024-01-07 VITALS — BP 133/72 | HR 58 | Wt 240.0 lb

## 2024-01-07 DIAGNOSIS — T148XXA Other injury of unspecified body region, initial encounter: Secondary | ICD-10-CM | POA: Insufficient documentation

## 2024-01-07 DIAGNOSIS — F410 Panic disorder [episodic paroxysmal anxiety] without agoraphobia: Secondary | ICD-10-CM | POA: Diagnosis not present

## 2024-01-07 DIAGNOSIS — E6609 Other obesity due to excess calories: Secondary | ICD-10-CM

## 2024-01-07 DIAGNOSIS — E66812 Obesity, class 2: Secondary | ICD-10-CM

## 2024-01-07 DIAGNOSIS — R7303 Prediabetes: Secondary | ICD-10-CM | POA: Diagnosis not present

## 2024-01-07 DIAGNOSIS — Z131 Encounter for screening for diabetes mellitus: Secondary | ICD-10-CM | POA: Diagnosis not present

## 2024-01-07 DIAGNOSIS — Z6836 Body mass index (BMI) 36.0-36.9, adult: Secondary | ICD-10-CM

## 2024-01-07 LAB — POCT GLYCOSYLATED HEMOGLOBIN (HGB A1C): Hemoglobin A1C: 5.7 % — AB (ref 4.0–5.6)

## 2024-01-07 MED ORDER — CLONAZEPAM 0.5 MG PO TABS: 0.5000 mg | ORAL_TABLET | Freq: Two times a day (BID) | ORAL | 5 refills | Status: DC | PRN

## 2024-01-07 NOTE — Patient Instructions (Signed)
 Hematoma A hematoma is a collection of blood under the skin, in an organ, in a body space, in a joint space, or in other tissue. The blood can thicken (clot) to form a lump that you can see and feel. The lump is often firm and may become sore and tender. Most hematomas get better in a few days to weeks. However, some hematomas may be serious and require medical care. Hematomas can range from very small to very large. What are the causes? This condition is caused by: A blunt or penetrating injury. Leakage from a blood vessel under the skin. Some medical procedures, including surgeries, such as oral surgery, face lifts, and surgeries on the joints. Some medical conditions that cause bleeding or bruising. There may be multiple hematomas that appear in different areas of the body. What increases the risk? You are more likely to develop this condition if: You are an older adult. You use blood thinners. You regularly use NSAIDs, such as ibuprofen, for pain. You play contact sports. What are the signs or symptoms?  Symptoms of this condition depend on where the hematoma is located.  Common symptoms of a hematoma that is under the skin include: A firm lump on the body. Pain and tenderness in the area. Bruising. Blue, dark blue, purple-red, or yellowish skin (discoloration) may appear at the site of the hematoma if the hematoma is close to the surface of the skin. Common symptoms of a hematoma that is deep in the tissues or body spaces may be less obvious. They include: A collection of blood in the stomach (intra-abdominal hematoma). This may cause pain in the abdomen, weakness, fainting, and shortness of breath. A collection of blood in the head (intracranial hematoma). This may cause a headache or symptoms such as weakness, trouble speaking or understanding, or a change in consciousness. How is this diagnosed? This condition is diagnosed based on: Your medical history. A physical exam. Imaging  tests, such as an ultrasound or CT scan. These may be needed if your health care provider suspects a hematoma in deeper tissues or body spaces. Blood tests. These may be needed if your health care provider believes that the hematoma is caused by a medical condition. How is this treated? Treatment for this condition depends on the cause, size, and location of the hematoma. Treatment may include: Doing nothing. The majority of hematomas do not need treatment as many of them go away on their own. Surgery or close monitoring. This may be needed for large hematomas or hematomas that affect vital organs. Medicines. Medicines may be given if there is an underlying medical cause for the hematoma. Follow these instructions at home: Managing pain, stiffness, and swelling  If directed, put ice on the injured area. To do this: Put ice in a plastic bag. Place a towel between your skin and the bag. Leave the ice on for 20 minutes, 2-3 times a day for the first couple of days. If your skin turns bright red, remove the ice right away to prevent skin damage. The risk of skin damage is higher if you cannot feel pain, heat, or cold. If directed, apply heat to the affected area as often as told by your health care provider. Use the heat source that your health care provider recommends, such as a moist heat pack or a heating pad. Place a towel between your skin and the heat source. Leave the heat on for 20-30 minutes. If your skin turns bright red, remove the heat right  away to prevent burns. The risk of burns is higher if you cannot feel pain, heat, or cold. Raise (elevate) the injured area above the level of your heart while you are sitting or lying down. If directed, wrap the affected area with an elastic bandage. The bandage applies pressure (compression) to the area, which may help to reduce swelling and promote healing. Do not wrap the bandage too tightly around the affected area. If your hematoma is on a leg  or foot (lower extremity) and is painful, your health care provider may recommend crutches. Use them as told by your health care provider. General instructions Take over-the-counter and prescription medicines only as told by your health care provider. Rest the injured area as directed by your health care provider. Keep all follow-up visits. Your health care provider may want to see how your hematoma is progressing with treatment. Contact a health care provider if: You have a fever. The swelling or discoloration gets worse. You develop more hematomas. Your pain is worse or your pain is not controlled with medicine. Your skin over the hematoma breaks or starts bleeding. Get help right away if: Your hematoma is in your chest or abdomen and you have weakness, shortness of breath, or a change in consciousness. You have a hematoma on your scalp that is caused by a fall or injury, and you also have: A headache that gets worse. Trouble speaking or understanding speech. Weakness. A change in alertness or consciousness. These symptoms may be an emergency. Get help right away. Call 911. Do not wait to see if the symptoms will go away. Do not drive yourself to the hospital. This information is not intended to replace advice given to you by your health care provider. Make sure you discuss any questions you have with your health care provider. Document Revised: 01/05/2022 Document Reviewed: 01/05/2022 Elsevier Patient Education  2024 [employer]  Preventing Type 2 Diabetes Mellitus Type 2 diabetes, also called type 2 diabetes mellitus, is a long-term (chronic) disease that affects sugar (glucose) levels in your blood. Normally, a hormone called insulin allows glucose to enter cells in your body. The cells use glucose for energy. With type 2 diabetes, you will have one or both of these problems: Your pancreas does not make enough insulin. Cells in your body do not respond properly to insulin that  your body makes (insulin resistance). Insulin resistance or lack of insulin causes extra glucose to build up in the blood instead of going into cells. As a result, high blood glucose (hyperglycemia) develops. That can cause many complications. Being overweight or obese and having an inactive (sedentary) lifestyle can increase your risk for diabetes. Type 2 diabetes can be delayed or prevented by making certain nutrition and lifestyle changes. How can this condition affect me? If you do not take steps to prevent diabetes, your blood glucose levels may keep increasing over time. Too much glucose in your blood for a long time can damage your blood vessels, heart, kidneys, nerves, and eyes. Type 2 diabetes can lead to chronic health problems and complications, such as: Heart disease. Stroke. Blindness. Kidney disease. Depression. Poor circulation in your feet and legs. In severe cases, a foot or leg may need to be surgically removed (amputated). What can increase my risk? You may be more likely to develop type 2 diabetes if you: Have type 2 diabetes in your family. Are overweight or obese. Have a sedentary lifestyle. Have insulin resistance or a history of prediabetes. Have a history  of pregnancy-related (gestational) diabetes or polycystic ovary syndrome (PCOS). What actions can I take to prevent this? It can be difficult to recognize signs of type 2 diabetes. Taking action to prevent the disease before you develop symptoms is the best way to avoid possible damage to your body. Making certain nutrition and lifestyle changes may prevent or delay the disease and related health problems. Nutrition  Eat healthy meals and snacks regularly. Do not skip meals. Fruit or a handful of nuts is a healthy snack between meals. Drink water  throughout the day. Avoid drinks that contain added sugar, such as soda or sweetened tea. Drink enough fluid to keep your urine pale yellow. Follow instructions from your  health care provider about eating or drinking restrictions. Limit the amount of food you eat by: Managing how much you eat at a time (portion size). Checking food labels for the serving sizes of food. Using a kitchen scale to weigh amounts of food. Saut or steam food instead of frying it. Cook with water  or broth instead of oils or butter. Limit saturated fat and salt (sodium) in your diet. Have no more than 1 tsp (2,400 mg) of sodium a day. If you have heart disease or high blood pressure, use less than ? tsp (1,500 mg) of sodium a day. Lifestyle  Lose weight if needed and as told. Your health care provider can determine how much weight loss is best for you and can help you lose weight safely. If you are overweight or obese, you may be told to lose at least 5?7% of your body weight. Manage blood pressure, cholesterol, and stress. Your health care provider will help determine the best treatment for you. Do not use any products that contain nicotine or tobacco. These products include cigarettes, chewing tobacco, and vaping devices, such as e-cigarettes. If you need help quitting, ask your health care provider. Activity  Do physical activity that makes your heart beat faster and makes you sweat (moderate intensity). Do this for at least 30 minutes on at least 5 days of the week, or as much as told by your health care provider. Ask your health care provider what activities are safe for you. A mix of activities may be best, such as walking, swimming, cycling, and strength training. Try to add physical activity into your day. For example: Park your car farther away than usual so that you walk more. Take a walk during your lunch break. Use stairs instead of elevators or escalators. Walk or bike to work instead of driving. Alcohol use If you drink alcohol: Limit how much you have to: 0?1 drink a day for women who are not pregnant. 0?2 drinks a day for men. Know how much alcohol is in your  drink. In the [place] one drink equals one 12 oz bottle of beer (355 mL), one 5 oz glass of wine (148 mL), or one 1 oz glass of hard liquor (44 mL). General information Talk with your health care provider about your risk factors and how you can reduce your risk for diabetes. Have your blood glucose tested regularly, as told by your health care provider. Get screening tests as told by your health care provider. You may have these regularly, especially if you have certain risk factors for type 2 diabetes. Make an appointment with a registered dietitian. This diet and nutrition specialist can help you make a healthy eating plan and help you understand portion sizes and food labels. Where to find support Ask your health care  provider to recommend a registered dietitian, a certified diabetes care and education specialist, or a weight loss program. Look for local or online weight loss groups. Join a gym, fitness club, or outdoor activity group, such as a walking club. Where to find more information For help and guidance and to learn more about diabetes and diabetes prevention, visit: American Diabetes Association (ADA): www.diabetes.[employer] of Diabetes and Digestive and Kidney Diseases: CarFlippers.tn To learn more about healthy eating, visit: [place] Department of Agriculture Architect): https://ball-collins.biz/ Office of Disease Prevention and Health Promotion (ODPHP): ResearchName.uy Summary You can delay or prevent type 2 diabetes by eating healthy foods, losing weight if needed, and increasing your physical activity. Talk with your health care provider about your risk factors for type 2 diabetes and how you can reduce your risk. It can be difficult to recognize the signs of type 2 diabetes. The best way to avoid possible damage to your body is to take action to prevent the disease before you develop symptoms. Get screening tests as told by your health care provider. This information is not  intended to replace advice given to you by your health care provider. Make sure you discuss any questions you have with your health care provider. Document Revised: 10/07/2020 Document Reviewed: 10/07/2020 Elsevier Patient Education  2024 [employer].

## 2024-01-07 NOTE — Progress Notes (Unsigned)
   Established Patient Office Visit  Subjective   Patient ID: Margaret Lawson, female    DOB: 1980-09-17  Age: 43 y.o. MRN: [id number]  Chief Complaint  Patient presents with   Medical Management of Chronic Issues    Bruise on right inner breast    HPI Pt is a 43 yo obese female who presents to the clinic to discuss large bruise on right inner breast for the last week. They were sexually active Saturday night, 7 days ago, and the next day a bruise started to form. She does not remember any pain or trauma. Bruise has darkened and gotten a bit large. Not warm to touch or tender. Not done anything to make better or worse.   Pt has been working on diet and exercise. She has lost 16lbs. She would like her A1C checked due to pre-diabetes dx in the past.   {History (Optional):23778}  ROS    Objective:     LMP 07/28/2015  BP Readings from Last 3 Encounters:  09/07/23 124/79  01/25/23 (!) 110/54  08/07/22 138/78   Wt Readings from Last 3 Encounters:  09/07/23 260 lb (117.9 kg)  01/25/23 256 lb (116.1 kg)  08/07/22 271 lb (122.9 kg)   .Aaron Aas Results for orders placed or performed in visit on 01/07/24  POCT HgB A1C   Collection Time: 01/07/24 11:31 AM  Result Value Ref Range   Hemoglobin A1C 5.7 (A) 4.0 - 5.6 %   HbA1c POC (<> result, manual entry)     HbA1c, POC (prediabetic range)     HbA1c, POC (controlled diabetic range)         Physical Exam   The 10-year ASCVD risk score (Arnett DK, et al., 2019) is: 4.5%    Assessment & Plan:  Aaron AasAaron AasKaela was seen today for medical management of chronic issues.  Diagnoses and all orders for this visit:  Hematoma -     CBC w/Diff/Platelet -     Fe+TIBC+Fer -     CMP14+EGFR  Panic attacks -     clonazePAM  (KLONOPIN ) 0.5 MG tablet; Take 1 tablet (0.5 mg total) by mouth 2 (two) times daily as needed. for anxiety  Screening for diabetes mellitus -     POCT HgB A1C  Pre-diabetes -     CMP14+EGFR      Return in about  6 months (around 07/08/2024), or if symptoms worsen or fail to improve.    Glendale Wherry, PA-C

## 2024-01-08 LAB — CBC WITH DIFFERENTIAL/PLATELET
Basophils Absolute: 0.1 10*3/uL (ref 0.0–0.2)
Basos: 1 %
EOS (ABSOLUTE): 0.1 10*3/uL (ref 0.0–0.4)
Eos: 1 %
Hematocrit: 46.5 % (ref 34.0–46.6)
Hemoglobin: 15.2 g/dL (ref 11.1–15.9)
Immature Grans (Abs): 0 10*3/uL (ref 0.0–0.1)
Immature Granulocytes: 0 %
Lymphocytes Absolute: 4.3 10*3/uL — ABNORMAL HIGH (ref 0.7–3.1)
Lymphs: 34 %
MCH: 29.3 pg (ref 26.6–33.0)
MCHC: 32.7 g/dL (ref 31.5–35.7)
MCV: 90 fL (ref 79–97)
Monocytes Absolute: 0.9 10*3/uL (ref 0.1–0.9)
Monocytes: 7 %
Neutrophils Absolute: 7.4 10*3/uL — ABNORMAL HIGH (ref 1.4–7.0)
Neutrophils: 57 %
Platelets: 283 10*3/uL (ref 150–450)
RBC: 5.19 x10E6/uL (ref 3.77–5.28)
RDW: 13.3 % (ref 11.7–15.4)
WBC: 12.8 10*3/uL — ABNORMAL HIGH (ref 3.4–10.8)

## 2024-01-08 LAB — CMP14+EGFR
ALT: 12 IU/L (ref 0–32)
AST: 21 IU/L (ref 0–40)
Albumin: 4.4 g/dL (ref 3.9–4.9)
Alkaline Phosphatase: 78 IU/L (ref 44–121)
BUN/Creatinine Ratio: 12 (ref 9–23)
BUN: 10 mg/dL (ref 6–24)
Bilirubin Total: 0.8 mg/dL (ref 0.0–1.2)
CO2: 23 mmol/L (ref 20–29)
Calcium: 10 mg/dL (ref 8.7–10.2)
Chloride: 95 mmol/L — ABNORMAL LOW (ref 96–106)
Creatinine, Ser: 0.86 mg/dL (ref 0.57–1.00)
Globulin, Total: 2.6 g/dL (ref 1.5–4.5)
Glucose: 105 mg/dL — ABNORMAL HIGH (ref 70–99)
Potassium: 3.5 mmol/L (ref 3.5–5.2)
Sodium: 137 mmol/L (ref 134–144)
Total Protein: 7 g/dL (ref 6.0–8.5)
eGFR: 86 mL/min/{1.73_m2} (ref >59)

## 2024-01-08 LAB — SPECIMEN STATUS REPORT

## 2024-01-08 LAB — IRON,TIBC AND FERRITIN PANEL
Ferritin: 199 ng/mL — ABNORMAL HIGH (ref 15–150)
Iron Saturation: 28 % (ref 15–55)
Iron: 95 ug/dL (ref 27–159)
Total Iron Binding Capacity: 336 ug/dL (ref 250–450)
UIBC: 241 ug/dL (ref 131–425)

## 2024-01-10 ENCOUNTER — Encounter: Payer: Self-pay | Admitting: Physician Assistant

## 2024-01-10 DIAGNOSIS — E6609 Other obesity due to excess calories: Secondary | ICD-10-CM | POA: Insufficient documentation

## 2024-01-11 ENCOUNTER — Ambulatory Visit: Payer: Self-pay | Admitting: Physician Assistant

## 2024-01-11 DIAGNOSIS — D72829 Elevated white blood cell count, unspecified: Secondary | ICD-10-CM

## 2024-01-11 NOTE — Progress Notes (Signed)
 Vaida,   Platelets are stable and in normal range.  WBC up some-could be exposure to virus, allergies, or infection. How are you feeling? Fever?

## 2024-01-12 NOTE — Progress Notes (Signed)
 Recheck CBC in next week to make sure coming down or let me know if feeling any illness.

## 2024-01-17 ENCOUNTER — Other Ambulatory Visit: Payer: Self-pay | Admitting: Physician Assistant

## 2024-01-17 ENCOUNTER — Encounter: Payer: Self-pay | Admitting: Physician Assistant

## 2024-01-17 DIAGNOSIS — F3181 Bipolar II disorder: Secondary | ICD-10-CM

## 2024-01-17 DIAGNOSIS — I10 Essential (primary) hypertension: Secondary | ICD-10-CM

## 2024-01-17 DIAGNOSIS — M797 Fibromyalgia: Secondary | ICD-10-CM

## 2024-01-17 MED ORDER — FUROSEMIDE 20 MG PO TABS: 20.0000 mg | ORAL_TABLET | Freq: Every day | ORAL | 0 refills | Status: DC | PRN

## 2024-01-17 NOTE — Telephone Encounter (Signed)
 Requesting rx rf of furosemide   Last written 02/06/2022 Last OV 01/07/2024 Upcoming appt = none

## 2024-01-17 NOTE — Telephone Encounter (Signed)
 Please advise on refill request

## 2024-01-18 ENCOUNTER — Other Ambulatory Visit: Payer: Self-pay

## 2024-01-18 DIAGNOSIS — D72829 Elevated white blood cell count, unspecified: Secondary | ICD-10-CM

## 2024-01-19 ENCOUNTER — Other Ambulatory Visit: Payer: Self-pay

## 2024-01-19 ENCOUNTER — Ambulatory Visit: Payer: Self-pay | Admitting: Physician Assistant

## 2024-01-19 DIAGNOSIS — D729 Disorder of white blood cells, unspecified: Secondary | ICD-10-CM

## 2024-01-19 LAB — CBC WITH DIFFERENTIAL/PLATELET
Basophils Absolute: 0.1 10*3/uL (ref 0.0–0.2)
Basos: 1 %
EOS (ABSOLUTE): 0.1 10*3/uL (ref 0.0–0.4)
Eos: 1 %
Hematocrit: 44 % (ref 34.0–46.6)
Hemoglobin: 14.5 g/dL (ref 11.1–15.9)
Immature Grans (Abs): 0 10*3/uL (ref 0.0–0.1)
Immature Granulocytes: 0 %
Lymphocytes Absolute: 4.6 10*3/uL — ABNORMAL HIGH (ref 0.7–3.1)
Lymphs: 34 %
MCH: 29.9 pg (ref 26.6–33.0)
MCHC: 33 g/dL (ref 31.5–35.7)
MCV: 91 fL (ref 79–97)
Monocytes Absolute: 0.9 10*3/uL (ref 0.1–0.9)
Monocytes: 7 %
Neutrophils Absolute: 7.8 10*3/uL — ABNORMAL HIGH (ref 1.4–7.0)
Neutrophils: 57 %
Platelets: 282 10*3/uL (ref 150–450)
RBC: 4.85 x10E6/uL (ref 3.77–5.28)
RDW: 13.4 % (ref 11.7–15.4)
WBC: 13.4 10*3/uL — ABNORMAL HIGH (ref 3.4–10.8)

## 2024-01-19 NOTE — Progress Notes (Signed)
Referral placed per result note. 

## 2024-01-19 NOTE — Progress Notes (Signed)
 IWBC still elevated. f you are not having any illness symptoms. I would like for you to have a hematology consultation. Are you ok with this?

## 2024-02-10 ENCOUNTER — Inpatient Hospital Stay: Attending: Hematology and Oncology

## 2024-02-10 ENCOUNTER — Inpatient Hospital Stay: Admitting: Hematology and Oncology

## 2024-02-10 VITALS — BP 110/65 | HR 56 | Temp 97.3°F | Resp 13 | Wt 246.7 lb

## 2024-02-10 DIAGNOSIS — D72825 Bandemia: Secondary | ICD-10-CM

## 2024-02-10 DIAGNOSIS — Z803 Family history of malignant neoplasm of breast: Secondary | ICD-10-CM | POA: Insufficient documentation

## 2024-02-10 DIAGNOSIS — D72829 Elevated white blood cell count, unspecified: Secondary | ICD-10-CM | POA: Insufficient documentation

## 2024-02-10 DIAGNOSIS — F17290 Nicotine dependence, other tobacco product, uncomplicated: Secondary | ICD-10-CM

## 2024-02-10 DIAGNOSIS — Z79899 Other long term (current) drug therapy: Secondary | ICD-10-CM | POA: Diagnosis not present

## 2024-02-10 DIAGNOSIS — Z801 Family history of malignant neoplasm of trachea, bronchus and lung: Secondary | ICD-10-CM | POA: Diagnosis not present

## 2024-02-10 LAB — LACTATE DEHYDROGENASE: LDH: 135 U/L (ref 98–192)

## 2024-02-10 LAB — CBC WITH DIFFERENTIAL (CANCER CENTER ONLY)
Abs Immature Granulocytes: 0.04 K/uL (ref 0.00–0.07)
Basophils Absolute: 0.1 K/uL (ref 0.0–0.1)
Basophils Relative: 1 %
Eosinophils Absolute: 0.2 K/uL (ref 0.0–0.5)
Eosinophils Relative: 2 %
HCT: 40.1 % (ref 36.0–46.0)
Hemoglobin: 13.9 g/dL (ref 12.0–15.0)
Immature Granulocytes: 0 %
Lymphocytes Relative: 29 %
Lymphs Abs: 3.4 K/uL (ref 0.7–4.0)
MCH: 29.4 pg (ref 26.0–34.0)
MCHC: 34.7 g/dL (ref 30.0–36.0)
MCV: 84.8 fL (ref 80.0–100.0)
Monocytes Absolute: 0.8 K/uL (ref 0.1–1.0)
Monocytes Relative: 7 %
Neutro Abs: 7.2 K/uL (ref 1.7–7.7)
Neutrophils Relative %: 61 %
Platelet Count: 249 K/uL (ref 150–400)
RBC: 4.73 MIL/uL (ref 3.87–5.11)
RDW: 12.9 % (ref 11.5–15.5)
WBC Count: 11.7 K/uL — ABNORMAL HIGH (ref 4.0–10.5)
nRBC: 0 % (ref 0.0–0.2)

## 2024-02-10 LAB — CMP (CANCER CENTER ONLY)
ALT: 13 U/L (ref 0–44)
AST: 16 U/L (ref 15–41)
Albumin: 3.9 g/dL (ref 3.5–5.0)
Alkaline Phosphatase: 57 U/L (ref 38–126)
Anion gap: 6 (ref 5–15)
BUN: 12 mg/dL (ref 6–20)
CO2: 32 mmol/L (ref 22–32)
Calcium: 9.4 mg/dL (ref 8.9–10.3)
Chloride: 102 mmol/L (ref 98–111)
Creatinine: 0.79 mg/dL (ref 0.44–1.00)
GFR, Estimated: >60 mL/min (ref >60)
Glucose, Bld: 96 mg/dL (ref 70–99)
Potassium: 3.7 mmol/L (ref 3.5–5.1)
Sodium: 140 mmol/L (ref 135–145)
Total Bilirubin: 0.4 mg/dL (ref 0.0–1.2)
Total Protein: 7 g/dL (ref 6.5–8.1)

## 2024-02-10 LAB — SEDIMENTATION RATE: Sed Rate: 26 mm/h — ABNORMAL HIGH (ref 0–22)

## 2024-02-10 LAB — C-REACTIVE PROTEIN: CRP: 2.3 mg/dL — ABNORMAL HIGH (ref <1.0)

## 2024-02-10 NOTE — Progress Notes (Unsigned)
 [facility] [phone]   [phone]  INITIAL CONSULT NOTE  Patient Care Team: Antoniette Vermell LITTIE DEVONNA as PCP - General (Family Medicine)  Hematological/Oncological History # Leukocytosis, Neutrophilic Predominate 01/18/2024: WBC 13.4, Hgb 14.5, MCV 91.0, Plt 282  02/10/2024: establish care with Dr. Federico   CHIEF COMPLAINTS/PURPOSE OF CONSULTATION:  Leukocytosis   HISTORY OF PRESENTING ILLNESS:  Margaret Lawson 43 y.o. female with medical history significant for anxiety, asthma, bipolar disorder, GERD, migraines, PCOS, and restless leg syndrome who presents for evaluation of leukocytosis.   On review of the previous records Margaret Lawson has leukocytosis dating back to at least 2017, at which time she had a WBC 11.5 on 11/19/2015. Since then she had had normal Hgb and Plt but consistently has an elevated WBC. Most recently on 01/18/2024 she had a CBC drawn which showed WBC 13.4, Hgb 14.5, MCV 91.0, Plt 282. Due to concern for her persistent leukocytosis she was referred to hematology for further evaluation and management.   On exam today Margaret Lawson notes that she has had high white blood cells on and off for no obvious reason.  She notes he does have some occasional bruising but does take Aleve.  She reports that her white blood cells are typically elevated in the 12-13 range.  She notes that she has been having some fatigue lately but no runny nose, sore throat, cough.  She denies any other infectious symptoms such as fevers, chills, sweats.  She reports that she does vape and previously was a smoker but quit cigarettes 2 years ago.  She does have fibromyalgia but no other inflammatory disorders.  She reports that she does snore but does not have any apnea.  She had a sleep test over 20 years ago.  On further discussion she reports that her mother had an aneurysm and her father had type 2 diabetes.  She reports her maternal grandmother had breast  cancer and maternal grandfather had lung cancer.  She reports that she did have precancer of the cervix.  She notes that she rarely drinks alcohol.  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  Otherwise she denies any focal infectious symptoms or joint pain, rash.  Full 10 point ROS is otherwise negative.  MEDICAL HISTORY:  Past Medical History:  Diagnosis Date   Anxiety    severe   Asthma, chronic 12/27/2013   Bipolar 2 disorder (HCC)    Essential hypertension, benign 12/27/2013   Fibromyalgia 12/27/2013   GERD (gastroesophageal reflux disease)    Headache    migraines, over stimulation   Leukocytosis 12/27/2013   Low iron    PCOS (polycystic ovarian syndrome)    Perforation of left tympanic membrane 12/27/2013   Poor dentition    right side upper and lower   Restless leg syndrome    Right leg pain    Teeth clenching    Teeth grinding    Weakness    when exposed to hot or cold temperatures    SURGICAL HISTORY: Past Surgical History:  Procedure Laterality Date   CHOLECYSTECTOMY     ROBOTIC ASSISTED TOTAL HYSTERECTOMY WITH BILATERAL SALPINGO OOPHERECTOMY Bilateral 09/10/2015   Procedure: ROBOTIC ASSISTED TOTAL HYSTERECTOMY WITH BILATERAL SALPINGECTOMY;  Surgeon: Elveria Mungo, MD;  Location: [facility];  Service: Gynecology;  Laterality: Bilateral;   WISDOM TOOTH EXTRACTION      SOCIAL HISTORY: Social History   Socioeconomic History   Marital status: Married    Spouse name:  Not on file   Number of children: Not on file   Years of education: Not on file   Highest education level: Not on file  Occupational History   Occupation: unemployed  Tobacco Use   Smoking status: Every Day    Current packs/day: 1.00    Average packs/day: 1 pack/day for 20.0 years (20.0 ttl pk-yrs)    Types: Cigarettes   Smokeless tobacco: Never   Tobacco comments:    uses 6mg  vapes  Substance and Sexual Activity   Alcohol use: No    Alcohol/week: 0.0 standard drinks of  alcohol   Drug use: No   Sexual activity: Yes    Partners: Male    Birth control/protection: None  Other Topics Concern   Not on file  Social History Narrative   Not on file   Social Drivers of Health   Financial Resource Strain: Patient Declined (11/25/2023)   Received from [facility]   Overall Financial Resource Strain (CARDIA)    Difficulty of Paying Living Expenses: Patient declined  Food Insecurity: No Food Insecurity (02/10/2024)   Hunger Vital Sign    Worried About Running Out of Food in the Last Year: Never true    Ran Out of Food in the Last Year: Never true  Transportation Needs: No Transportation Needs (02/10/2024)   PRAPARE - Administrator, Civil Service (Medical): No    Lack of Transportation (Non-Medical): No  Physical Activity: Inactive (01/09/2021)   Exercise Vital Sign    Days of Exercise per Week: 0 days    Minutes of Exercise per Session: 0 min  Stress: No Stress Concern Present (01/09/2021)   [employer] of Occupational Health - Occupational Stress Questionnaire    Feeling of Stress : Only a little  Social Connections: Unknown (12/02/2021)   Received from [facility]   Social Network    Social Network: Not on file  Intimate Partner Violence: Not At Risk (02/10/2024)   Humiliation, Afraid, Rape, and Kick questionnaire    Fear of Current or Ex-Partner: No    Emotionally Abused: No    Physically Abused: No    Sexually Abused: No    FAMILY HISTORY: Family History  Problem Relation Age of Onset   Aneurysm Mother        brain   Depression Father    Heart disease Father    Mitral valve prolapse Father    Diabetes Father    Hypertension Father    Depression Sister    Breast cancer Paternal Aunt        29s   Lung cancer Maternal Grandfather    Breast cancer Paternal Grandmother        16s    ALLERGIES:  is allergic to clarithromycin, adhesive [tape], ambien  [zolpidem  tartrate], celecoxib, doxycycline , erythromycin, lyrica  [pregabalin], morphine and codeine, penicillins, and latex.  MEDICATIONS:  Current Outpatient Medications  Medication Sig Dispense Refill   Probiotic Product (CULTURELLE PROBIOTICS PO)      acetaminophen  (TYLENOL ) 500 MG tablet Take 500 mg by mouth every 6 (six) hours as needed for mild pain or moderate pain.     albuterol  (VENTOLIN  HFA) 108 (90 Base) MCG/ACT inhaler INHALE 2 PUFFS BY MOUTH EVERY 4 HOURS AS NEEDED FOR WHEEZING 9 g 0   atenolol  (TENORMIN ) 50 MG tablet Take 1 tablet (50 mg total) by mouth 2 (two) times daily. 180 tablet 3   busPIRone  (BUSPAR ) 15 MG tablet Take 1 tablet (15 mg total) by mouth 3 (three)  times daily. 270 tablet 1   clonazePAM  (KLONOPIN ) 0.5 MG tablet Take 1 tablet (0.5 mg total) by mouth 2 (two) times daily as needed. for anxiety 20 tablet 5   estradiol  (ESTRACE ) 1 MG tablet Take 1 tablet (1 mg total) by mouth daily. Need appointment. 90 tablet 1   furosemide  (LASIX ) 20 MG tablet Take 1 tablet (20 mg total) by mouth daily as needed for edema. 90 tablet 0   ipratropium-albuterol  (DUONEB) 0.5-2.5 (3) MG/3ML SOLN Take 3 mLs by nebulization every 2 (two) hours as needed (wheeze, SOB). 60 mL 3   lamoTRIgine  (LAMICTAL ) 100 MG tablet Take 1 tablet (100 mg total) by mouth daily. 90 tablet 3   MELATONIN PO Take by mouth.     methocarbamol  (ROBAXIN ) 500 MG tablet TAKE 1 TABLET BY MOUTH THREE TIMES DAILY 90 tablet 0   Multiple Vitamin (MULTIVITAMIN) capsule Take 1 capsule by mouth daily.     pantoprazole  (PROTONIX ) 40 MG tablet Take 1 tablet (40 mg total) by mouth daily. 90 tablet 3   potassium chloride  (KLOR-CON ) 10 MEQ tablet TAKE 1 TABLET BY MOUTH ONCE DAILY WITH  LASIX  AS  NEEDED 90 tablet 0   sertraline  (ZOLOFT ) 25 MG tablet Take 1 tablet (25 mg total) by mouth at bedtime. 90 tablet 3   traZODone  (DESYREL ) 50 MG tablet Take 2 tablets (100 mg total) by mouth at bedtime. 180 tablet 1   triamterene -hydrochlorothiazide (MAXZIDE-25) 37.5-25 MG tablet Take 1 tablet by mouth  daily. 90 tablet 1   valACYclovir  (VALTREX ) 500 MG tablet Take 1 tablet (500 mg total) by mouth daily. 90 tablet 1   VRAYLAR  3 MG capsule Take 1 capsule by mouth once daily 90 capsule 0   No current facility-administered medications for this visit.    REVIEW OF SYSTEMS:   Constitutional: ( - ) fevers, ( - )  chills , ( - ) night sweats Eyes: ( - ) blurriness of vision, ( - ) double vision, ( - ) watery eyes Ears, nose, mouth, throat, and face: ( - ) mucositis, ( - ) sore throat Respiratory: ( - ) cough, ( - ) dyspnea, ( - ) wheezes Cardiovascular: ( - ) palpitation, ( - ) chest discomfort, ( - ) lower extremity swelling Gastrointestinal:  ( - ) nausea, ( - ) heartburn, ( - ) change in bowel habits Skin: ( - ) abnormal skin rashes Lymphatics: ( - ) new lymphadenopathy, ( - ) easy bruising Neurological: ( - ) numbness, ( - ) tingling, ( - ) new weaknesses Behavioral/Psych: ( - ) mood change, ( - ) new changes  All other systems were reviewed with the patient and are negative.  PHYSICAL EXAMINATION:  Vitals:   02/10/24 1257  BP: 110/65  Pulse: (!) 56  Resp: 13  Temp: (!) 97.3 F (36.3 C)  SpO2: 97%   Filed Weights   02/10/24 1257  Weight: 246 lb 11.2 oz (111.9 kg)    GENERAL: well appearing middle-aged Caucasian female in NAD  SKIN: skin color, texture, turgor are normal, no rashes or significant lesions EYES: conjunctiva are pink and non-injected, sclera clear LUNGS: clear to auscultation and percussion with normal breathing effort HEART: regular rate & rhythm and no murmurs and no lower extremity edema Musculoskeletal: no cyanosis of digits and no clubbing  PSYCH: alert & oriented x 3, fluent speech NEURO: no focal motor/sensory deficits  LABORATORY DATA:  I have reviewed the data as listed    Latest Ref Rng &  Units 02/10/2024    1:37 PM 01/18/2024    2:06 PM 01/07/2024   12:00 AM  CBC  WBC 4.0 - 10.5 K/uL 11.7  13.4  12.8   Hemoglobin 12.0 - 15.0 g/dL 86.0  85.4   84.7   Hematocrit 36.0 - 46.0 % 40.1  44.0  46.5   Platelets 150 - 400 K/uL 249  282  283        Latest Ref Rng & Units 02/10/2024    1:37 PM 01/07/2024   12:00 AM 09/07/2023   10:53 AM  CMP  Glucose 70 - 99 mg/dL 96  894  896   BUN 6 - 20 mg/dL 12  10  9    Creatinine 0.44 - 1.00 mg/dL 9.20  9.13  9.26   Sodium 135 - 145 mmol/L 140  137  138   Potassium 3.5 - 5.1 mmol/L 3.7  3.5  3.6   Chloride 98 - 111 mmol/L 102  95  98   CO2 22 - 32 mmol/L 32  23  27   Calcium 8.9 - 10.3 mg/dL 9.4  89.9  8.9   Total Protein 6.5 - 8.1 g/dL 7.0  7.0  6.3   Total Bilirubin 0.0 - 1.2 mg/dL 0.4  0.8  0.5   Alkaline Phos 38 - 126 U/L 57  78  79   AST 15 - 41 U/L 16  21  15    ALT 0 - 44 U/L 13  12  14       ASSESSMENT & PLAN Margaret Lawson 43 y.o. female with medical history significant for anxiety, asthma, bipolar disorder, GERD, migraines, PCOS, and restless leg syndrome who presents for evaluation of leukocytosis.   After review of the labs, review of the records, and discussion with the patient the patients findings are most consistent with chronic leukocytosis of unclear etiology  Neutrophilic predominant leukocytosis is a common hematological finding with numerous possible etiologies. Possible causes include chronic inflammation, infection (transient or chronic), medication side effect (particularly steroids), myeloproliferative neoplasm, or idiopathic. The most common cause of chronic inflammation causing leukocytosis is cigarette smoking. Obstructive sleep apnea can also produce a similar pattern of leukocytosis. If there is no clear cause (or if there is an increase in other cell lines) a myeloproliferative neoplasm workup should be conducted with JAK2 w/ reflex and BCR/ABL FISH.  Idiopathic cases should be observed for stability.    # Leukocytosis, Neutrophilic Predominant  --today will order CBC, CMP, and LDH  --inflammatory workup with ESR and CRP  --encourage vaping cessation   --recommend testing for OSA if the rest of the workup is negative --no focal signs concerning for infection.   --will proceed with MPN workup to include JAK2 w/ reflex and BCR/ABL FISH   --RTC pending the results of the above studies  Orders Placed This Encounter  Procedures   CBC with Differential (Cancer Center Only)    Standing Status:   Future    Number of Occurrences:   1    Expiration Date:   02/09/2025   CMP (Cancer Center only)    Standing Status:   Future    Number of Occurrences:   1    Expiration Date:   02/09/2025   Lactate dehydrogenase (LDH)    Standing Status:   Future    Number of Occurrences:   1    Expiration Date:   02/09/2025   Sedimentation rate    Standing Status:   Future    Number  of Occurrences:   1    Expiration Date:   02/09/2025   C-reactive protein    Standing Status:   Future    Number of Occurrences:   1    Expiration Date:   02/09/2025   JAK2 V617 reflex CALR/MPL/E12-15    Standing Status:   Future    Number of Occurrences:   1    Expiration Date:   02/09/2025   BCR-ABL1 FISH    Standing Status:   Future    Number of Occurrences:   1    Expiration Date:   02/09/2025    All questions were answered. The patient knows to call the clinic with any problems, questions or concerns.  A total of more than 60 minutes were spent on this encounter with face-to-face time and non-face-to-face time, including preparing to see the patient, ordering tests and/or medications, counseling the patient and coordination of care as outlined above.   Norleen IVAR Kidney, MD Department of Hematology/Oncology [facility] at [facility] Phone: [phone] Pager: [phone] Email: [email or web address]  02/11/2024 8:33 AM

## 2024-02-15 ENCOUNTER — Encounter: Payer: Self-pay | Admitting: Hematology and Oncology

## 2024-02-17 LAB — CALR +MPL + E12-E15  (REFLEX)

## 2024-02-17 LAB — JAK2 V617F RFX CALR/MPL/E12-15

## 2024-02-20 ENCOUNTER — Other Ambulatory Visit: Payer: Self-pay | Admitting: Physician Assistant

## 2024-02-20 DIAGNOSIS — F3181 Bipolar II disorder: Secondary | ICD-10-CM

## 2024-02-20 LAB — BCR-ABL1 FISH
Cells Analyzed: 200
Cells Counted: 200

## 2024-03-12 ENCOUNTER — Other Ambulatory Visit: Payer: Self-pay | Admitting: Physician Assistant

## 2024-03-12 DIAGNOSIS — Z5181 Encounter for therapeutic drug level monitoring: Secondary | ICD-10-CM

## 2024-03-12 DIAGNOSIS — I10 Essential (primary) hypertension: Secondary | ICD-10-CM

## 2024-03-12 DIAGNOSIS — F411 Generalized anxiety disorder: Secondary | ICD-10-CM

## 2024-03-12 DIAGNOSIS — B009 Herpesviral infection, unspecified: Secondary | ICD-10-CM

## 2024-03-16 ENCOUNTER — Encounter: Payer: Self-pay | Admitting: Physician Assistant

## 2024-03-16 DIAGNOSIS — Z5181 Encounter for therapeutic drug level monitoring: Secondary | ICD-10-CM

## 2024-03-16 DIAGNOSIS — I10 Essential (primary) hypertension: Secondary | ICD-10-CM

## 2024-03-16 DIAGNOSIS — F411 Generalized anxiety disorder: Secondary | ICD-10-CM

## 2024-03-16 DIAGNOSIS — B009 Herpesviral infection, unspecified: Secondary | ICD-10-CM

## 2024-03-16 MED ORDER — VALACYCLOVIR HCL 500 MG PO TABS: 500.0000 mg | ORAL_TABLET | Freq: Every day | ORAL | 0 refills | Status: DC

## 2024-03-16 MED ORDER — SERTRALINE HCL 25 MG PO TABS: 25.0000 mg | ORAL_TABLET | Freq: Every day | ORAL | 0 refills | Status: DC

## 2024-03-16 MED ORDER — ESTRADIOL 1 MG PO TABS: 1.0000 mg | ORAL_TABLET | Freq: Every day | ORAL | 0 refills | Status: DC

## 2024-03-16 MED ORDER — TRIAMTERENE-HCTZ 37.5-25 MG PO TABS: 1.0000 | ORAL_TABLET | Freq: Every day | ORAL | 0 refills | Status: DC

## 2024-03-29 ENCOUNTER — Ambulatory Visit: Payer: Self-pay | Admitting: *Deleted

## 2024-03-29 NOTE — Telephone Encounter (Signed)
 TCT patient regarding recent lab results.  Spoke with her. Advised that we did not find any concerning abnormalities in her blood work to explain her high white blood cell count.  At this time Dr. Federico thinks it is likely secondary to vaping.  There is no need for routine follow-up in our clinic.  We are happy to see her back for any new or worsening hematological issues. Pt voiced understanding.

## 2024-03-29 NOTE — Telephone Encounter (Signed)
-----   Message from Norleen ONEIDA Kidney IV sent at 03/27/2024  7:06 PM EDT ----- Please let Ms. Lowdermilk know that we did not find any concerning abnormalities in her blood work to explain her high white blood cell count.  At this time I do think it is likely secondary to  vaping.  There is no need for routine follow-up in our clinic.  We are happy to see her back for any new or worsening hematological issues. ----- Message ----- From: Rebecka, Lab In [place] Sent: 02/10/2024   1:51 PM EDT To: Norleen ONEIDA Kidney [place], MD

## 2024-04-16 ENCOUNTER — Other Ambulatory Visit: Payer: Self-pay | Admitting: Physician Assistant

## 2024-04-16 DIAGNOSIS — F3181 Bipolar II disorder: Secondary | ICD-10-CM

## 2024-04-16 DIAGNOSIS — M797 Fibromyalgia: Secondary | ICD-10-CM

## 2024-04-16 DIAGNOSIS — E876 Hypokalemia: Secondary | ICD-10-CM

## 2024-04-16 DIAGNOSIS — I10 Essential (primary) hypertension: Secondary | ICD-10-CM

## 2024-04-16 DIAGNOSIS — K219 Gastro-esophageal reflux disease without esophagitis: Secondary | ICD-10-CM

## 2024-04-24 ENCOUNTER — Telehealth: Admitting: Physician Assistant

## 2024-04-24 DIAGNOSIS — F3181 Bipolar II disorder: Secondary | ICD-10-CM | POA: Diagnosis not present

## 2024-04-24 MED ORDER — VRAYLAR 4.5 MG PO CAPS: ORAL_CAPSULE | ORAL | 1 refills | Status: AC

## 2024-04-24 MED ORDER — LAMOTRIGINE 100 MG PO TABS: 100.0000 mg | ORAL_TABLET | Freq: Every day | ORAL | 1 refills | Status: AC

## 2024-04-24 NOTE — Progress Notes (Signed)
..  Virtual Visit via Video Note  I connected with Margaret Lawson on 05/03/24 at 10:30 AM EDT by a video enabled telemedicine application and verified that I am speaking with the correct person using two identifiers.  Location: Patient: home Provider: clinic  .SABRAParticipating in visit:  Patient: Margaret Lawson Provider: Vermell Bologna PA-C   I discussed the limitations of evaluation and management by telemedicine and the availability of in person appointments. The patient expressed understanding and agreed to proceed.  History of Present Illness: Pt is a 43 yo female who calls into the clinic to get medications refilled for Bipolar 2. She was doing really well for a while on medications but work has recently become more stressful and working more hours. She would like to make increase in Vraylar . She denies any SI/HC.     Observations/Objective: No acute distress Normal mood and appearance Normal breathing  ..    04/24/2024   10:18 AM 02/10/2024    1:06 PM 01/07/2024   11:27 AM 09/18/2022   10:46 AM 08/07/2022    1:42 PM  Depression screen PHQ 2/9  Decreased Interest 2 0 0 1 1  Down, Depressed, Hopeless 2 0 0 0 1  PHQ - 2 Score 4 0 0 1 2  Altered sleeping 1   0 2  Tired, decreased energy 3   1 2   Change in appetite 0   1 1  Feeling bad or failure about yourself  2   2 1   Trouble concentrating 3   2 2   Moving slowly or fidgety/restless 0   0 0  Suicidal thoughts 0   0 0  PHQ-9 Score 13   7 10   Difficult doing work/chores     Somewhat difficult   ..    04/24/2024   10:19 AM 09/18/2022   10:48 AM 08/07/2022    1:42 PM 04/22/2022    2:25 PM  GAD 7 : Generalized Anxiety Score  Nervous, Anxious, on Edge 3 1 1    Control/stop worrying 3 1 2    Worry too much - different things 3 1 2    Trouble relaxing 2 2 1    Restless 3 2 2    Easily annoyed or irritable 2 1 1    Afraid - awful might happen 2 3 1    Total GAD 7 Score 18 11 10    Anxiety Difficulty Not difficult at all Somewhat  difficult Somewhat difficult      Information is confidential and restricted. Go to Review Flowsheets to unlock data.       Assessment and Plan: SABRASABRADiagnoses and all orders for this visit:  Bipolar 2 disorder, major depressive episode (HCC) -     Cariprazine  HCl (VRAYLAR ) 4.5 MG CAPS; Take one tablet daily. -     lamoTRIgine  (LAMICTAL ) 100 MG tablet; Take 1 tablet (100 mg total) by mouth daily.   PHQ/GAD not to goal Increased vraylar  Continue lamictal  Follow up in 3 months Continue to meet with counselor regularly   Follow Up Instructions:    I discussed the assessment and treatment plan with the patient. The patient was provided an opportunity to ask questions and all were answered. The patient agreed with the plan and demonstrated an understanding of the instructions.   The patient was advised to call back or seek an in-person evaluation if the symptoms worsen or if the condition fails to improve as anticipated.   Oluwafemi Villella, PA-C

## 2024-05-03 ENCOUNTER — Encounter: Payer: Self-pay | Admitting: Physician Assistant

## 2024-05-07 ENCOUNTER — Other Ambulatory Visit: Payer: Self-pay | Admitting: Physician Assistant

## 2024-05-07 DIAGNOSIS — G4700 Insomnia, unspecified: Secondary | ICD-10-CM

## 2024-06-02 ENCOUNTER — Telehealth: Admitting: Physician Assistant

## 2024-06-02 DIAGNOSIS — F411 Generalized anxiety disorder: Secondary | ICD-10-CM

## 2024-06-02 DIAGNOSIS — F3181 Bipolar II disorder: Secondary | ICD-10-CM | POA: Diagnosis not present

## 2024-06-02 DIAGNOSIS — G4700 Insomnia, unspecified: Secondary | ICD-10-CM | POA: Diagnosis not present

## 2024-06-02 DIAGNOSIS — M797 Fibromyalgia: Secondary | ICD-10-CM | POA: Diagnosis not present

## 2024-06-02 DIAGNOSIS — I10 Essential (primary) hypertension: Secondary | ICD-10-CM

## 2024-06-02 DIAGNOSIS — F410 Panic disorder [episodic paroxysmal anxiety] without agoraphobia: Secondary | ICD-10-CM

## 2024-06-02 MED ORDER — CLONAZEPAM 0.5 MG PO TABS: 0.5000 mg | ORAL_TABLET | Freq: Two times a day (BID) | ORAL | 5 refills | Status: AC | PRN

## 2024-06-02 MED ORDER — TRIAMTERENE-HCTZ 37.5-25 MG PO TABS: 1.0000 | ORAL_TABLET | Freq: Every day | ORAL | 1 refills | Status: AC

## 2024-06-02 MED ORDER — SERTRALINE HCL 50 MG PO TABS: 50.0000 mg | ORAL_TABLET | Freq: Every day | ORAL | 0 refills | Status: DC

## 2024-06-02 NOTE — Progress Notes (Signed)
 Virtual Visit via Video Note  I connected with Margaret Lawson on 06/05/24 at  2:40 PM EST by a video enabled telemedicine application and verified that I am speaking with the correct person using two identifiers.  Location: Patient: home Provider: clinic  .SABRAParticipating in visit:  Patient: Margaret Lawson Provider:Mia Winthrop Antoniette PA-C   I discussed the limitations of evaluation and management by telemedicine and the availability of in person appointments. The patient expressed understanding and agreed to proceed.  History of Present Illness: Discussed the use of AI scribe software for clinical note transcription with the patient, who gave verbal consent to proceed.  History of Present Illness Margaret Lawson is a 43 year old female who presents with work-related stress and medication management.  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 - Feels overwhelmed and expresses concern that 'things are falling through the cracks' and 'I'm only one person' - Currently working 20 hours per week - Requires a letter to maintain her current 20-hour work schedule due to potential changes at her workplace - Considering searching for a new job due to increased stress  Psychotropic medication management - Currently taking sertraline  - Prescribed clonazepam  0.5 mg twice daily - Clonazepam  prescription only lasts 10 days if taken as directed, leading her to stretch out the medication       Observations/Objective: No acute distress Normal mood and appearance.     Assessment and Plan: SABRASABRADiagnoses and all orders for this visit:  Bipolar 2 disorder, major depressive episode (HCC) -     sertraline  (ZOLOFT ) 50 MG tablet; Take 1 tablet (50 mg total) by mouth daily.  Insomnia, unspecified type  Essential hypertension, benign -     triamterene -hydrochlorothiazide (MAXZIDE-25) 37.5-25 MG tablet;  Take 1 tablet by mouth daily.  Fibromyalgia  Generalized anxiety disorder  Panic attacks -     clonazePAM  (KLONOPIN ) 0.5 MG tablet; Take 1 tablet (0.5 mg total) by mouth 2 (two) times daily as needed. for anxiety   Assessment & Plan Bipolar II disorder, current major depressive episode Experiencing a major depressive episode with increased stress due to work-related issues. Considered increasing sertraline  dosage to manage symptoms. - Increased sertraline  dosage to 50 mg daily. - Continue on Vraylar /lamictal   Generalized anxiety disorder and panic disorder Experiencing anxiety related to work stress. Currently taking Klonopin  more frequently than prescribed. Initial prescription insufficient for her needs. - Increased Klonopin  prescription to 60 tablets. - Note written for work limiting hours a week.      Follow Up Instructions:    I discussed the assessment and treatment plan with the patient. The patient was provided an opportunity to ask questions and all were answered. The patient agreed with the plan and demonstrated an understanding of the instructions.   The patient was advised to call back or seek an in-person evaluation if the symptoms worsen or if the condition fails to improve as anticipated.  Meah Jiron, PA-C

## 2024-06-04 ENCOUNTER — Other Ambulatory Visit: Payer: Self-pay | Admitting: Physician Assistant

## 2024-06-04 DIAGNOSIS — B009 Herpesviral infection, unspecified: Secondary | ICD-10-CM

## 2024-06-04 DIAGNOSIS — F4310 Post-traumatic stress disorder, unspecified: Secondary | ICD-10-CM

## 2024-06-04 DIAGNOSIS — F411 Generalized anxiety disorder: Secondary | ICD-10-CM

## 2024-06-04 DIAGNOSIS — Z5181 Encounter for therapeutic drug level monitoring: Secondary | ICD-10-CM

## 2024-06-04 DIAGNOSIS — F410 Panic disorder [episodic paroxysmal anxiety] without agoraphobia: Secondary | ICD-10-CM

## 2024-06-04 DIAGNOSIS — M797 Fibromyalgia: Secondary | ICD-10-CM

## 2024-06-05 ENCOUNTER — Encounter: Payer: Self-pay | Admitting: Physician Assistant

## 2024-07-09 ENCOUNTER — Other Ambulatory Visit: Payer: Self-pay | Admitting: Physician Assistant

## 2024-07-09 DIAGNOSIS — E876 Hypokalemia: Secondary | ICD-10-CM

## 2024-07-09 DIAGNOSIS — I10 Essential (primary) hypertension: Secondary | ICD-10-CM

## 2024-07-30 ENCOUNTER — Other Ambulatory Visit: Payer: Self-pay | Admitting: Physician Assistant

## 2024-07-30 DIAGNOSIS — K219 Gastro-esophageal reflux disease without esophagitis: Secondary | ICD-10-CM

## 2024-07-30 DIAGNOSIS — I10 Essential (primary) hypertension: Secondary | ICD-10-CM

## 2024-07-30 DIAGNOSIS — G4700 Insomnia, unspecified: Secondary | ICD-10-CM

## 2024-07-31 ENCOUNTER — Encounter: Payer: Self-pay | Admitting: Physician Assistant

## 2024-08-29 ENCOUNTER — Other Ambulatory Visit: Payer: Self-pay | Admitting: Physician Assistant

## 2024-08-29 DIAGNOSIS — B009 Herpesviral infection, unspecified: Secondary | ICD-10-CM

## 2024-08-29 DIAGNOSIS — F3181 Bipolar II disorder: Secondary | ICD-10-CM
